# Patient Record
Sex: Female | Born: 1950 | Race: White
Health system: Southern US, Community
[De-identification: ages and names within clinical notes are randomized; demographics above are authoritative.]

## PROBLEM LIST (undated history)

## (undated) DIAGNOSIS — J449 Chronic obstructive pulmonary disease, unspecified: Secondary | ICD-10-CM

## (undated) DIAGNOSIS — C3490 Malignant neoplasm of unspecified part of unspecified bronchus or lung: Secondary | ICD-10-CM

## (undated) DIAGNOSIS — C50919 Malignant neoplasm of unspecified site of unspecified female breast: Secondary | ICD-10-CM

## (undated) DIAGNOSIS — I10 Essential (primary) hypertension: Secondary | ICD-10-CM

## (undated) DIAGNOSIS — E119 Type 2 diabetes mellitus without complications: Secondary | ICD-10-CM

## (undated) HISTORY — DX: Malignant neoplasm of unspecified part of unspecified bronchus or lung: C34.90

## (undated) HISTORY — DX: Malignant neoplasm of unspecified site of unspecified female breast: C50.919

## (undated) HISTORY — DX: Essential (primary) hypertension: I10

## (undated) HISTORY — DX: Type 2 diabetes mellitus without complications: E11.9

## (undated) HISTORY — DX: Chronic obstructive pulmonary disease, unspecified: J44.9

---

## 2013-04-01 IMAGING — MG MAMMO SCRN BIL W/CAD
6 series · 6 of 6 positions shown · non-contrast
Comparison: none

Images Obtained from [facility]
CLINICAL RA REF: Mammo Scrn Bilateral (Digital) W/ Cad Routine.
Comparison is made to exams dated:  02/02/2008 and 05/01/2006 [HOSPITAL].
The tissue of both breasts is predominantly fatty.
Current study was also evaluated with a Computer Aided Detection (CAD) system.
There is a new oval mass in the left breast at 5 o'clock anterior depth.
No other significant abnormalities are seen in either breast.

[R CC]
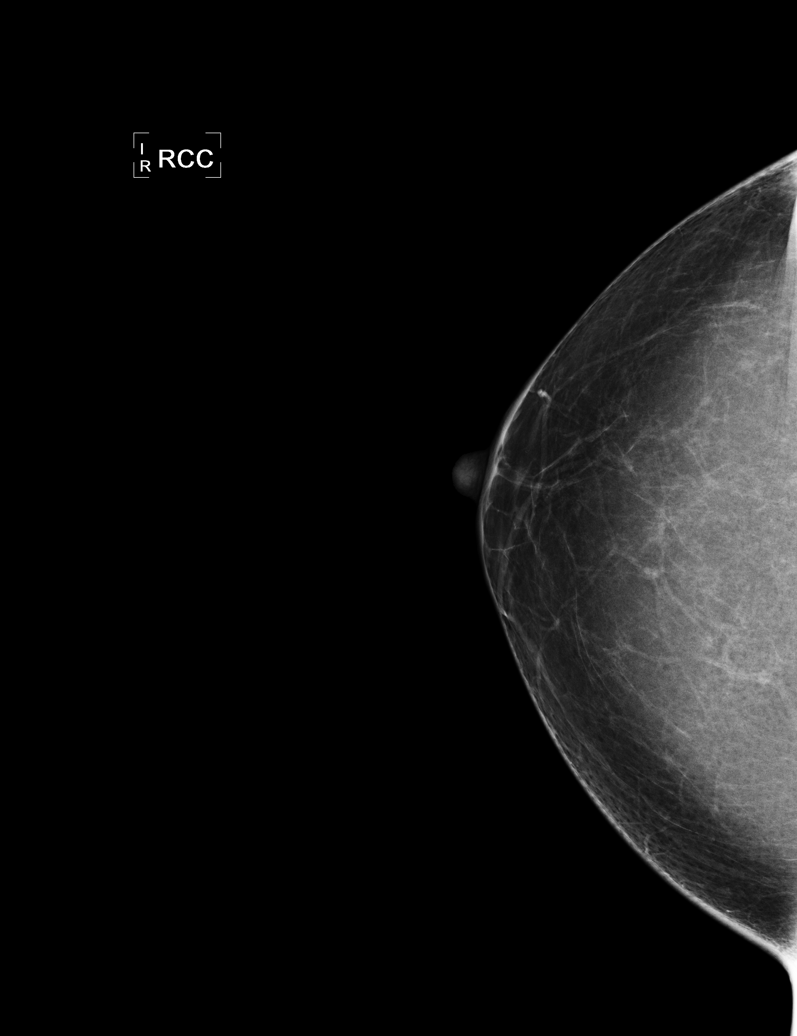

[L CC (1 of 2)]
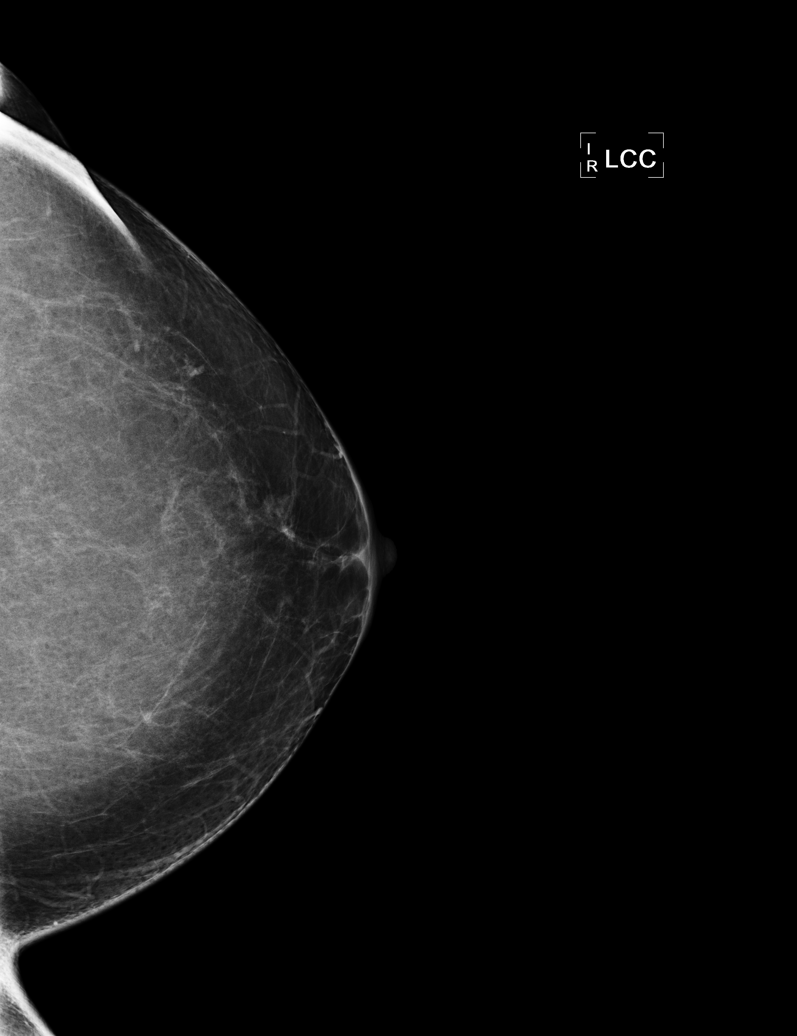

[L MLO (1 of 2)]
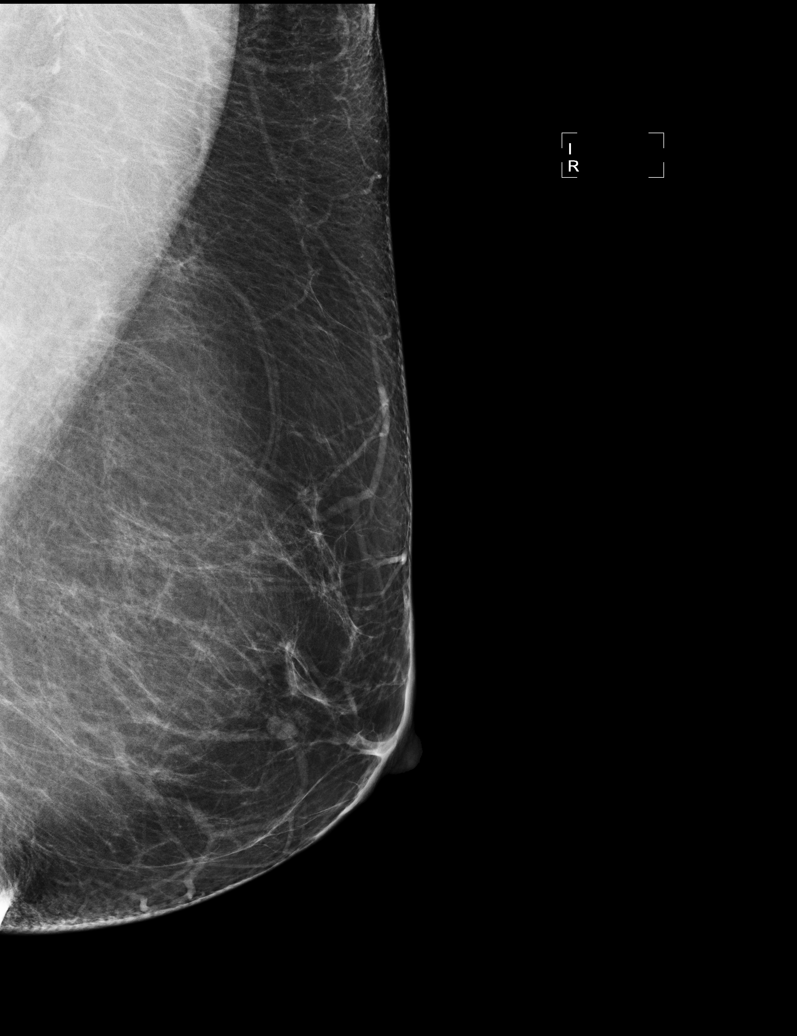

[R MLO]
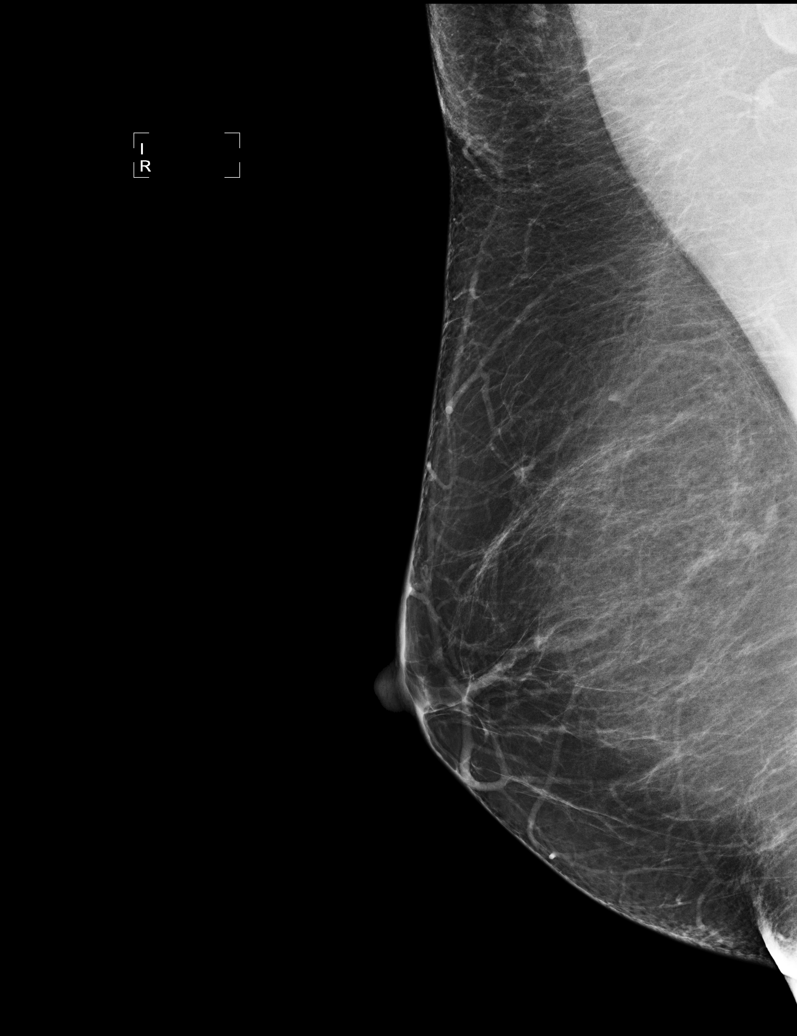

[L MLO (2 of 2)]
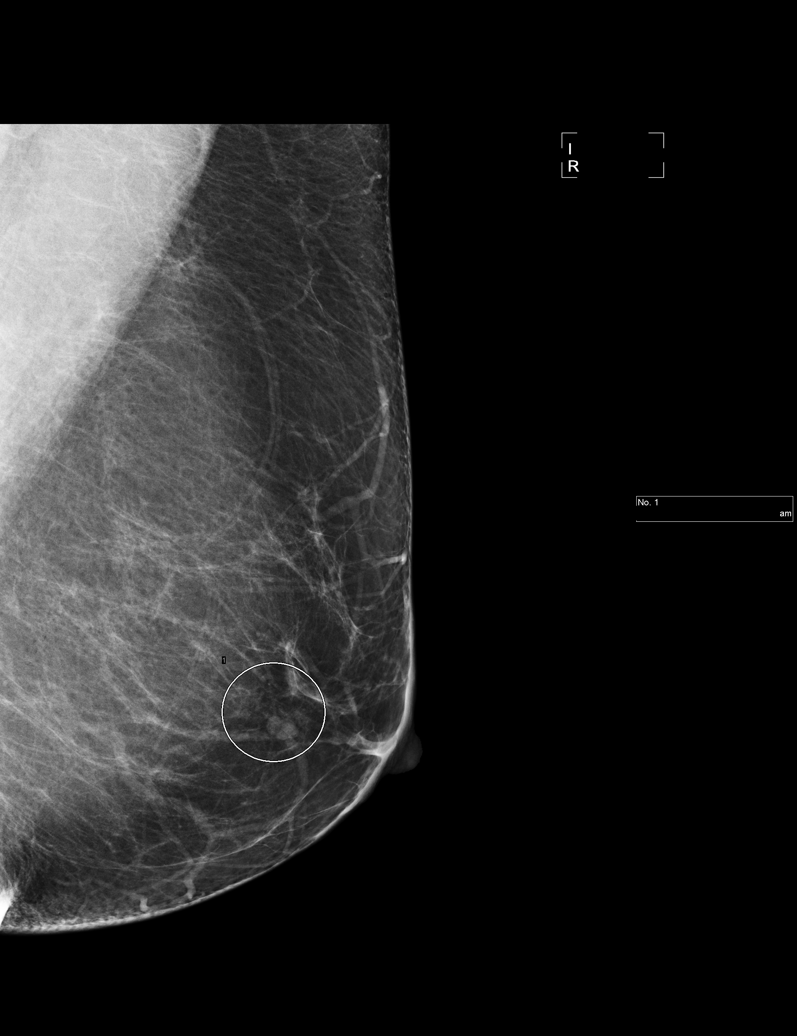

[L CC (2 of 2)]
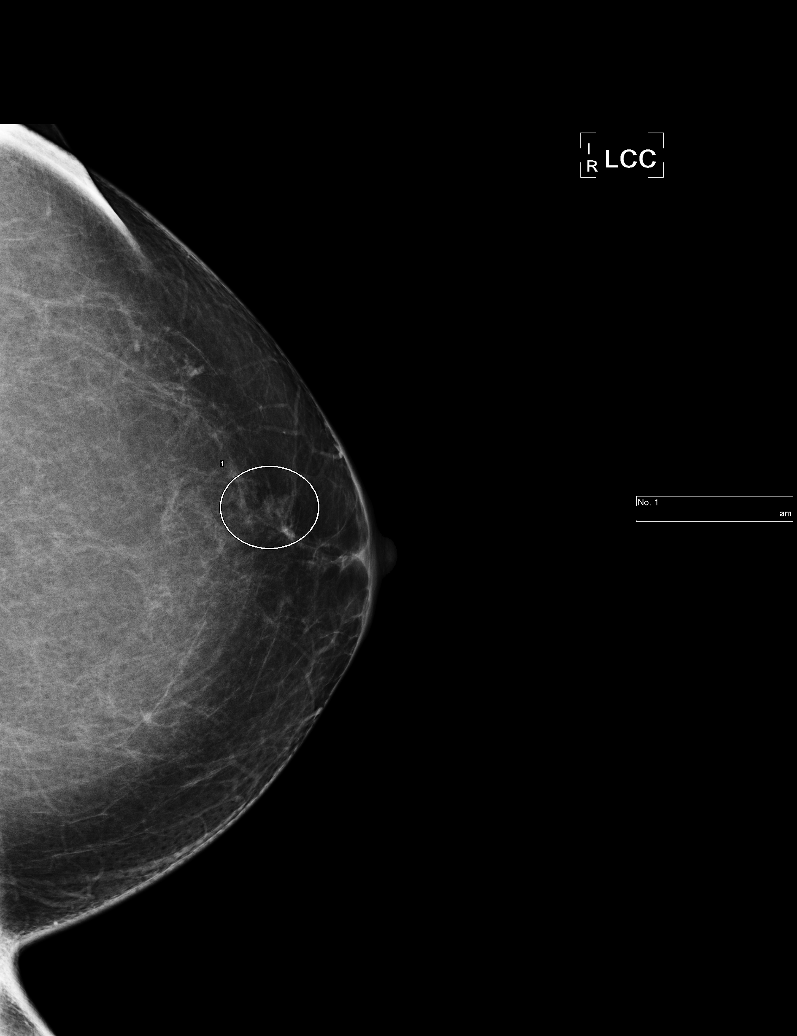

[6 of 6 positions shown; findings below may reference images not displayed]

IMPRESSION: The new oval mass in the left breast is indeterminate.  Additional views with possible ultrasound are recommended.
mwm/penrad:04/05/2013 [DATE]
letter sent: Birad 0
Mammogram BI-RADS: 0 INCOMPLETE:NEED ADDITIONAL IMAGING EVALUATION   [id number] v82.60

## 2013-04-01 IMAGING — OT DXA BONE DENSITY
2 series · 2 of 2 positions shown · non-contrast
Comparison: none

REASON FOR EXAM: Postmenopausal evaluate bone density

Risk Factors:  Current smoker
Prior Exams:  Done at [HOSPITAL] February 02, 2008 diagnosed as normal bone mineral density
Method:  Scans of the spine between L1-L4 and hip were performed using dual energy X-ray densitometry (DXA) with the [employer] Discovery-SL system, serial number [id number].

[Series 1: — · 1 of 1 slices shown (1 of 2)]
[im 1/1]
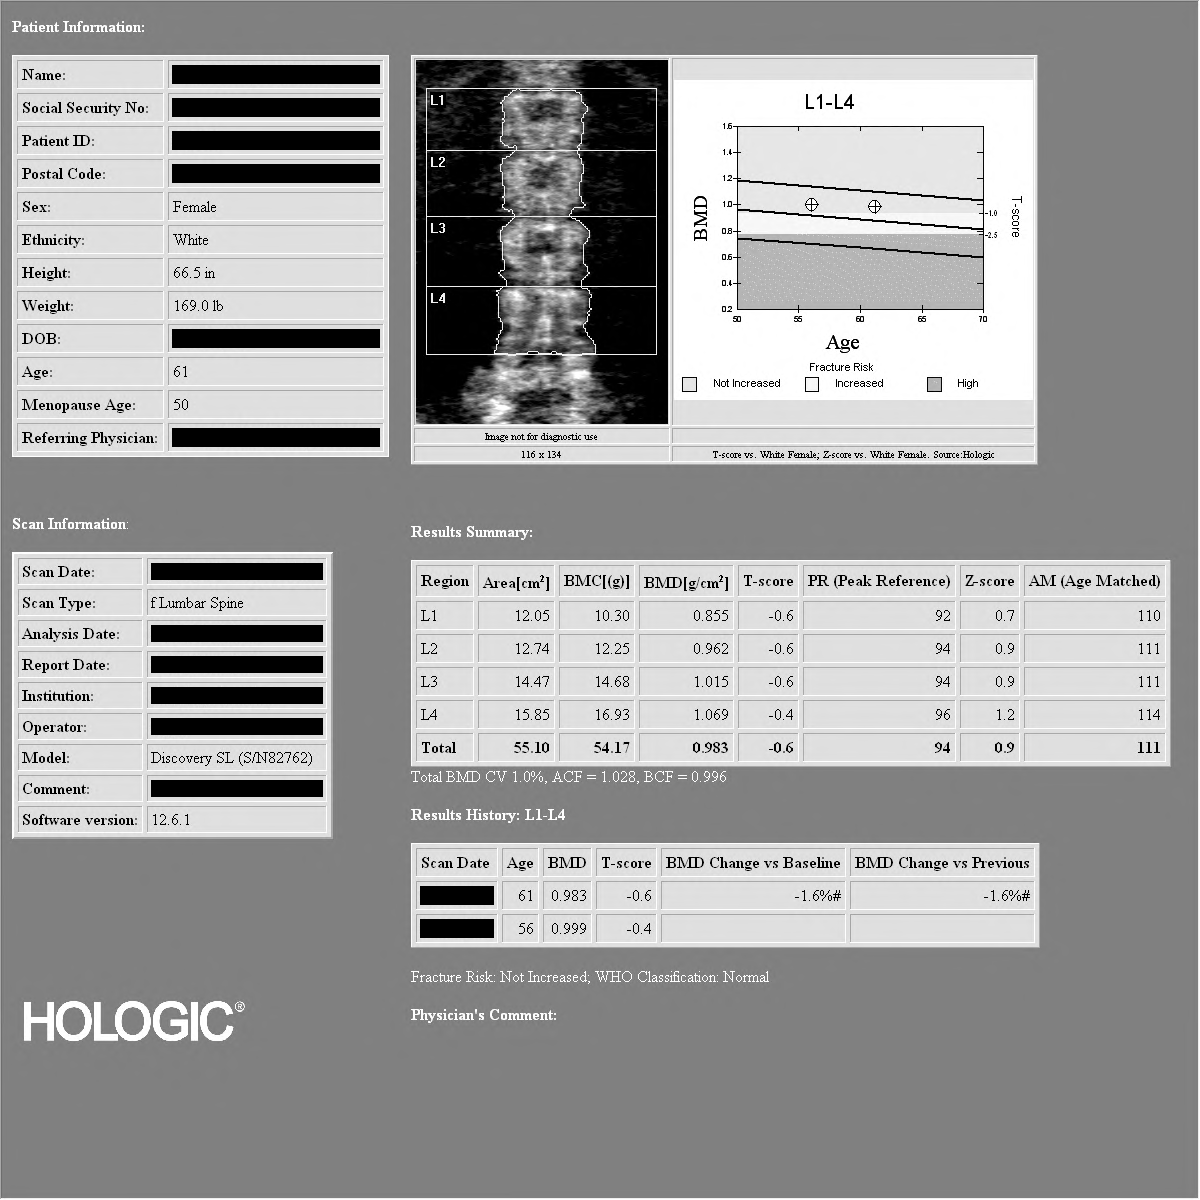

[Series 2: — · left · 1 of 1 slices shown (2 of 2)]
[im 1/1]
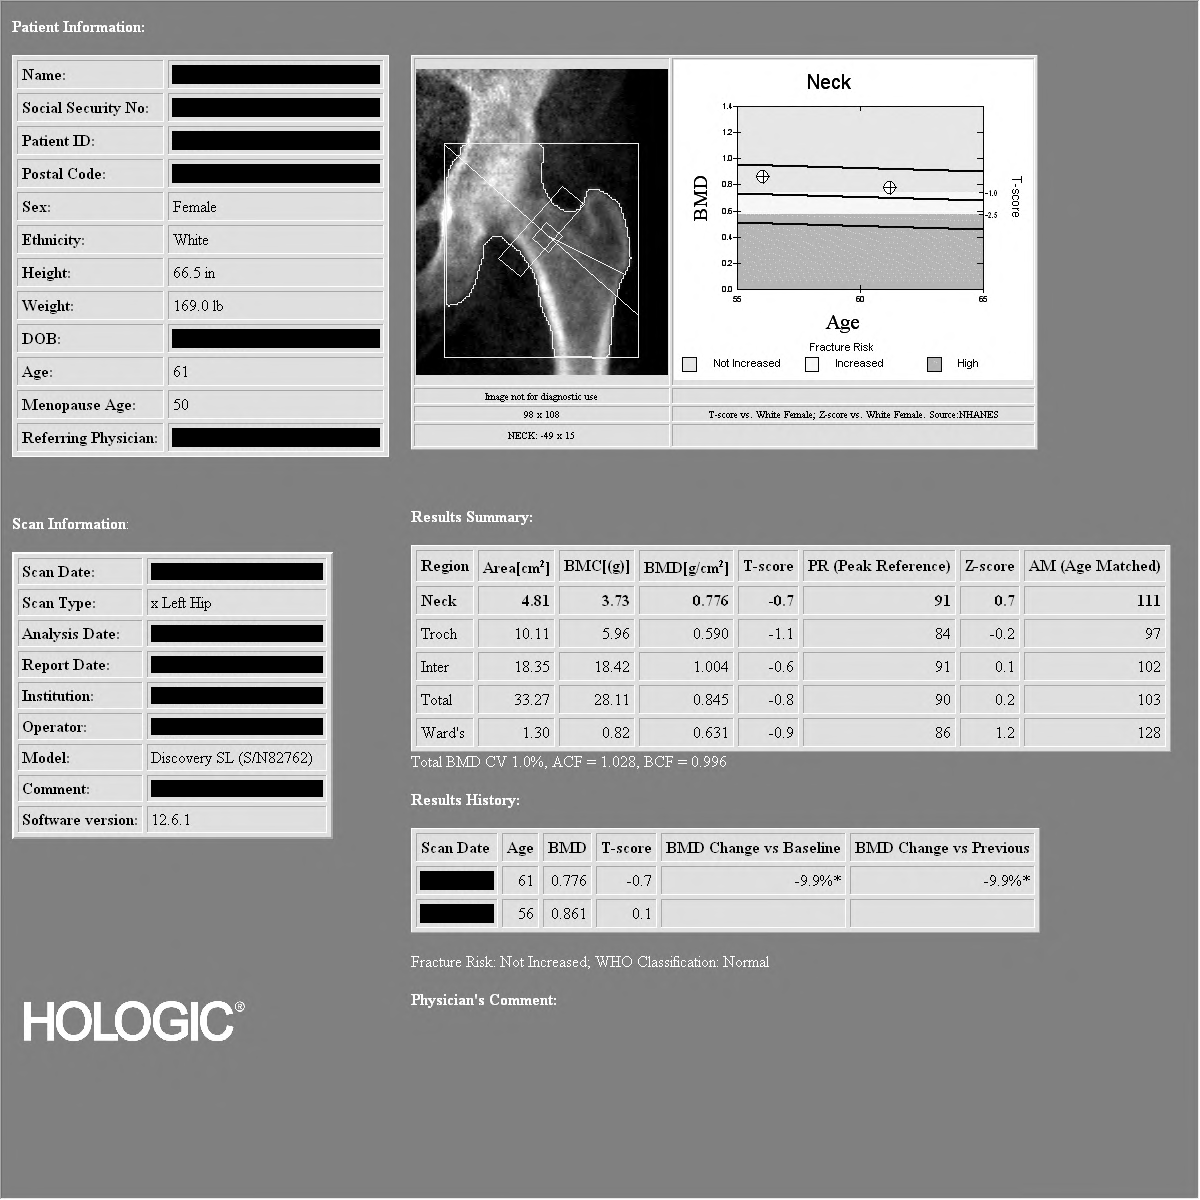

[2 of 2 positions shown; findings below may reference images not displayed]

IMPRESSION: As defined by World Health Organization, the patient meets the criteria for NORMAL BONE DENSITY based on both spine and hip T-score.  

Patient Demographics:  62 year old Caucasian Female.
FINDINGS: 1.    Review of scanogram images shows no factor invalidating scan results.  

2.    Lumbar spine exam shows average Bone Mineral Density is 0.983 gm/cm2 of Hydroxyapatite.  The T-score (comparing patient with a young adult group) is 0.6 standard deviations BELOW mean. The Z-score (comparing patient with an age-matched group) is 0.9 standard deviations ABOVE mean.

COMPARED TO PRIOR DXA, spine bone density was 0.999 gm/cm2.  This is an interval decrease of 0.016 gm/cm2 or 1.6 %. This is not a statistically significant interval decrease.

3.   Left hip exam using femoral neck region of interest shows average Bone Mineral Density is 0.776 gm/cm2 of Hydroxyapatite. The T-score (comparing patient with a young adult group) is 0.7 standard deviations BELOW mean. The Z-score (comparing patient with an age-matched group) is 0.7 standard deviations ABOVE mean.

COMPARED TO PRIOR DXA, femoral neck bone density was 0.861 gm/cm2.  This is an interval decrease of 0.085 gm/cm2 or 9.9 %. Technologist least significant change in the hip is 0.017 gm/cm2. This is a statistically significant interval decrease.

Recommendations:  In addition to assuring the patient is receiving adequate calcium and vitamin D, the patient states that she is not taking supplements on a regular basis, cessation of smoking and regular exercise to patient tolerance would be of benefit.  The patient is currently not taking prescribed medication for prevention of bone loss  According to new criteria established by The National Osteoporosis Foundation in 9449, the patient DOES NOT meet the current indications for prescribed medical therapy.  The National Osteoporosis Foundation now recommends followup DXA scanning every two years in patients at risk regardless of whether the patient is undergoing pharmacological treatment.

World Health Organization criteria for diagnosis, please see link below.  

[URL]

## 2013-05-05 IMAGING — US US BREAST LT
1 series · 6 of 6 positions shown · non-contrast
Comparison: none

Images Obtained from [facility]
CLINICAL RA REF: Patient returns for additional imaging over a suspected mass in the left breast.
Comparison is made to exams dated:  04/01/2013 and 02/02/2008 [HOSPITAL].
The tissue of the left breast is predominantly fatty.
There is an oval mass with a circumscribed margin in the left breast at 5 o'clock anterior depth.  This is seen in additional views.  Targeted ultrasound demonstrates a 0.7 cm x 0.4 cm cluster of
cysts at 5 o'clock anterior depth 2 cm from the nipple.  This correlates with mammography findings.
No other significant masses or calcifications are seen in the breast on the mammogram or targeted ultrasound.

[Series 1: us breast left · 6 of 6 slices shown]
[im 1/6]
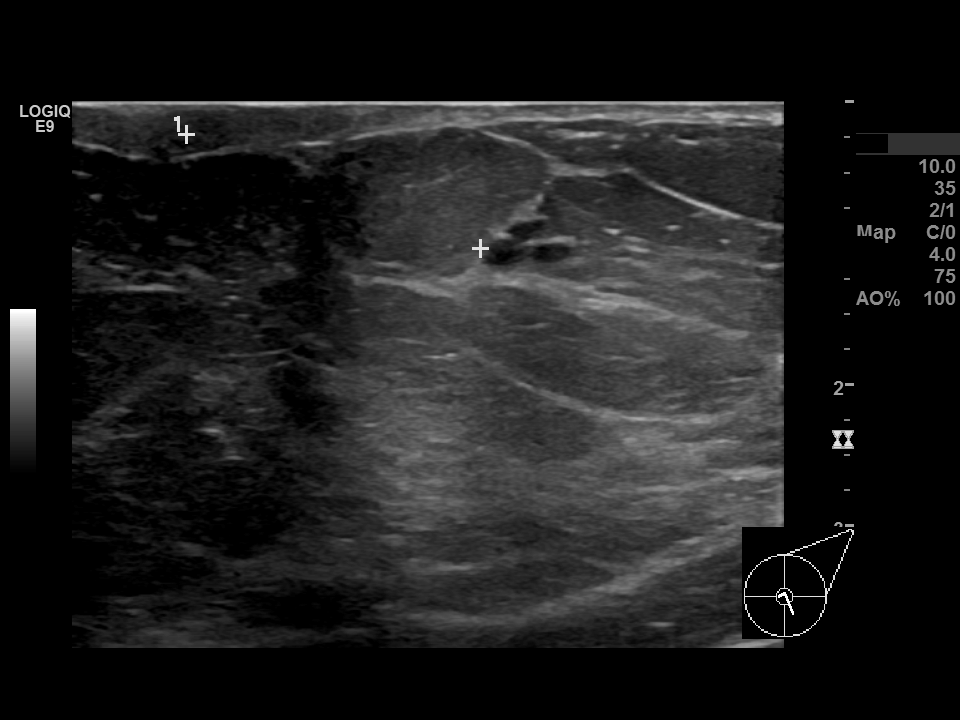
[im 2/6]
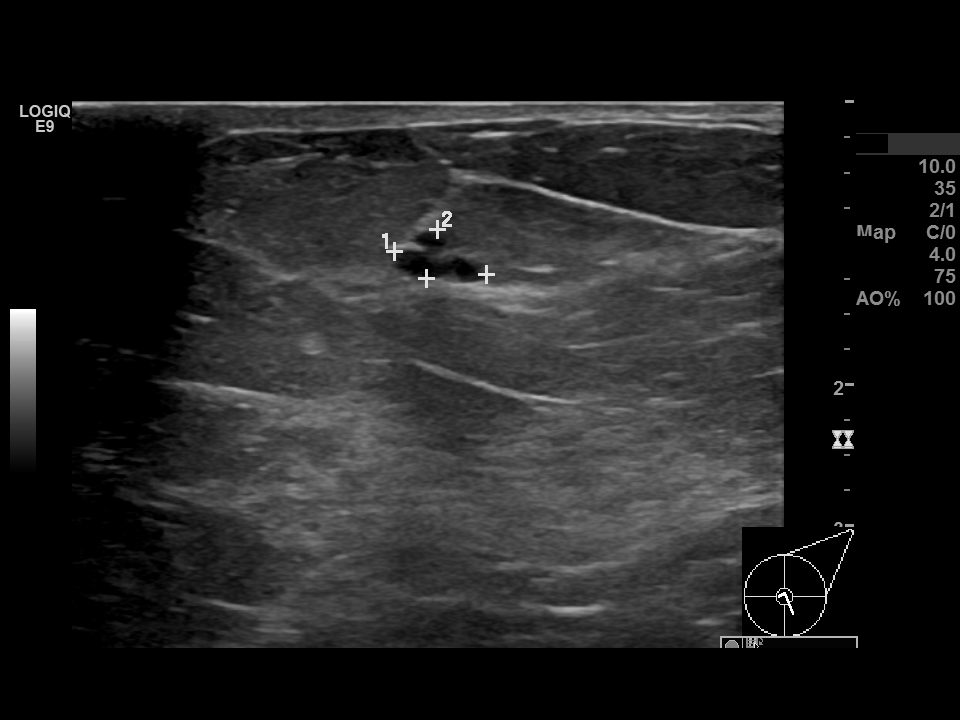
[im 3/6]
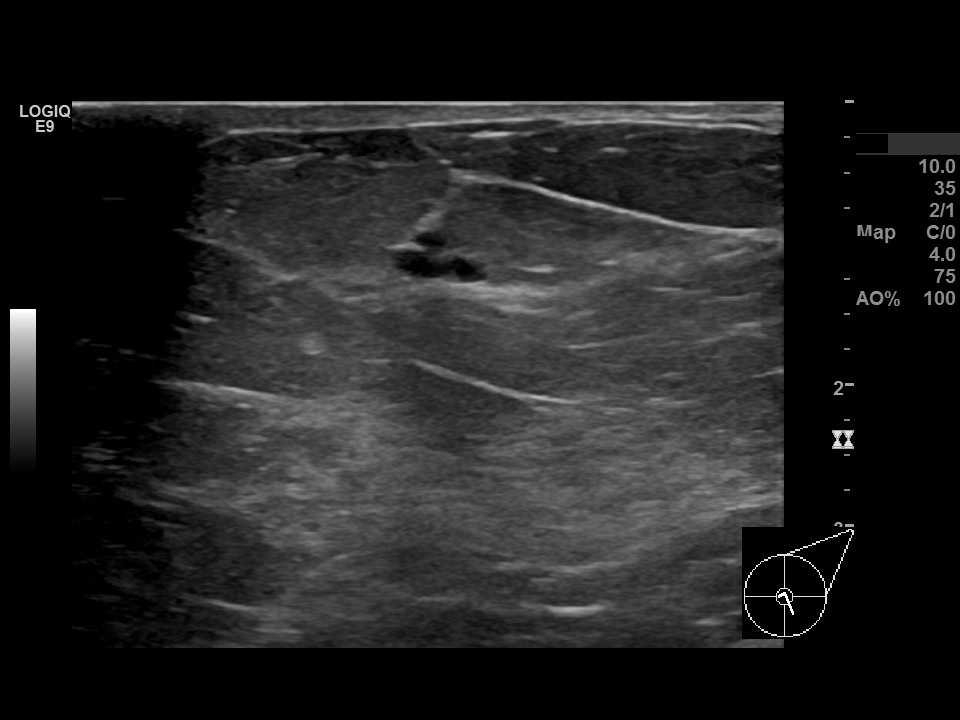
[im 4/6]
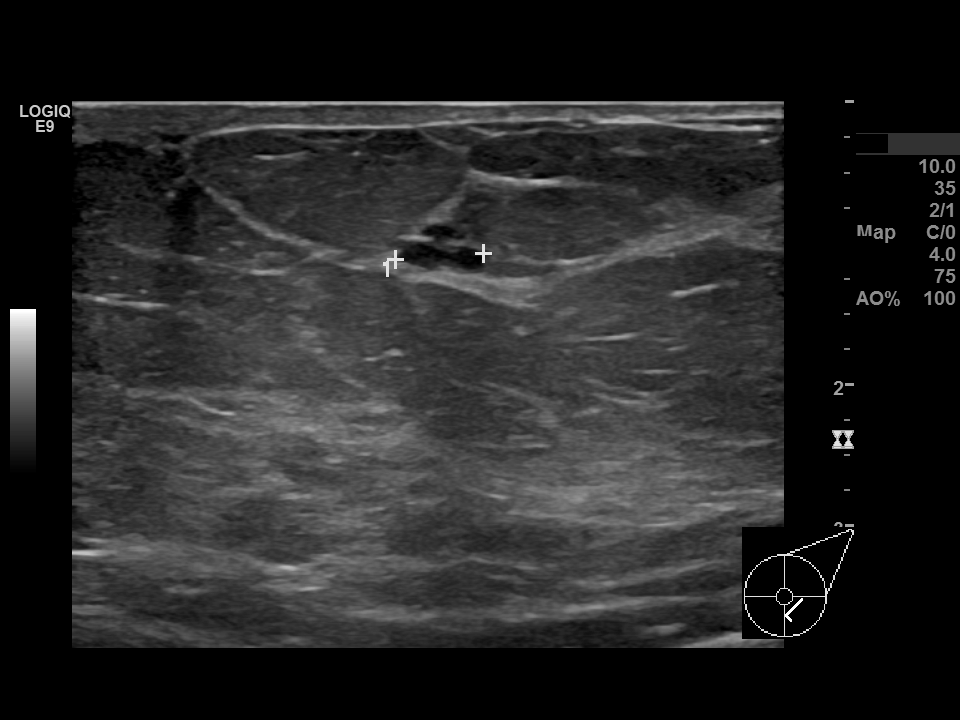
[im 5/6]
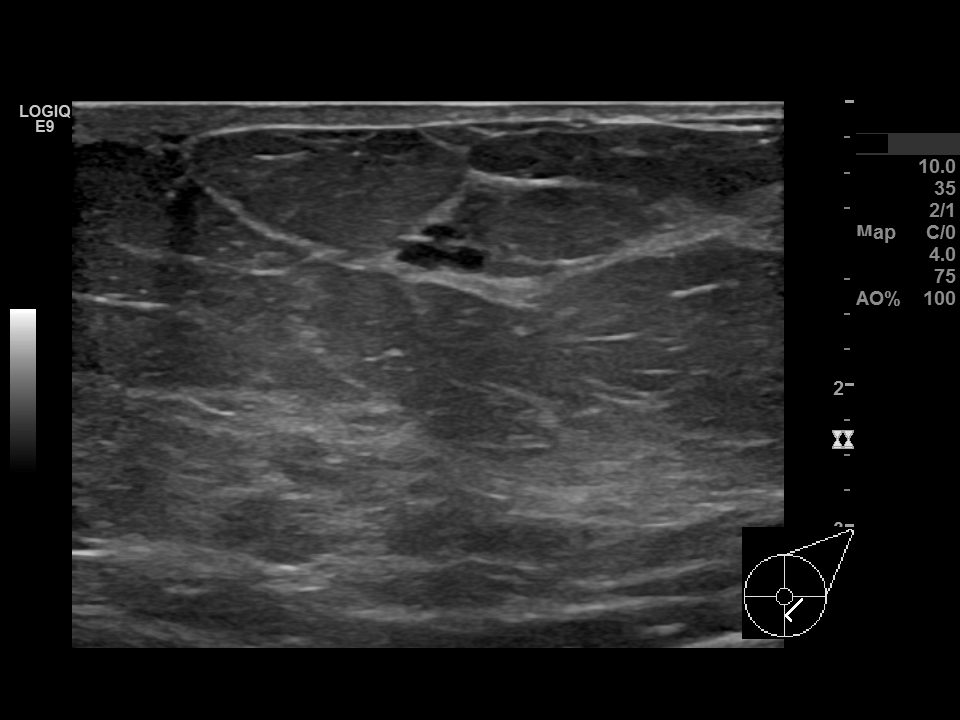
[im 6/6]
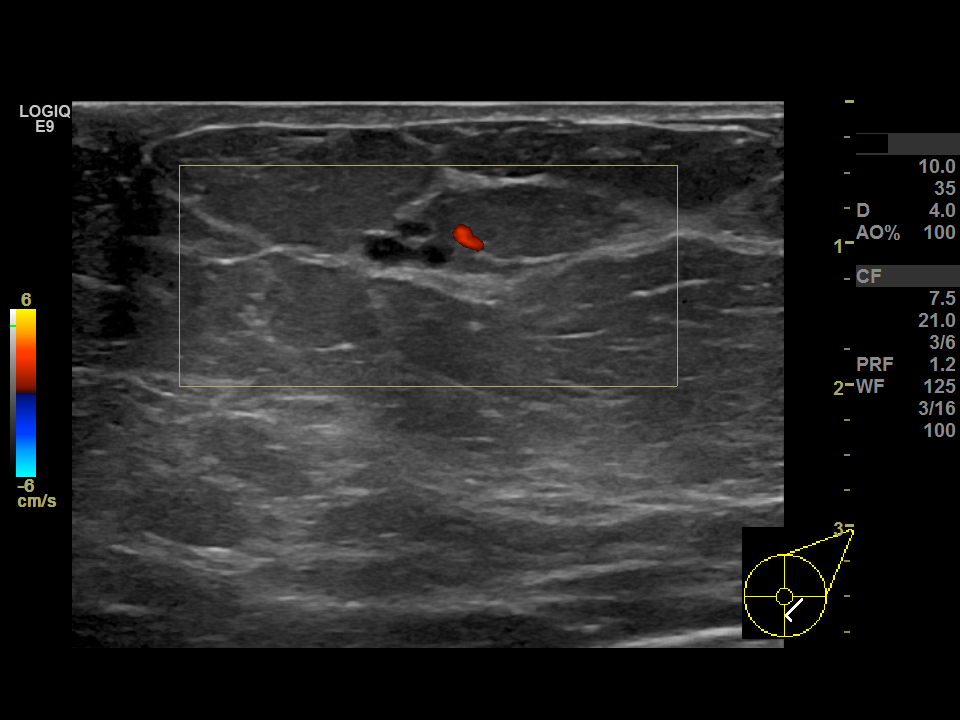

[6 of 6 positions shown; findings below may reference images not displayed]

IMPRESSION: The 0.7 cm x 0.4 cm cluster of cysts in the left breast is benign.
There is no mammographic or targeted sonographic evidence of malignancy.  A 1 year screening mammogram is recommended.
SUMMARY: Findings and recommendations were discussed with the patient.
mwm/penrad:05/05/2013 [DATE]
letter sent: Normal
Mammogram BI-RADS: 2 Benign  Ultrasound BI-RADS: 2 Benign   [id number]

## 2013-05-05 IMAGING — MG MAMMO DIAG LT
2 series · 2 of 2 positions shown · non-contrast
Comparison: none

Images Obtained from [facility]
CLINICAL RA REF: Patient returns for additional imaging over a suspected mass in the left breast.
Comparison is made to exams dated:  04/01/2013 and 02/02/2008 [HOSPITAL].
The tissue of the left breast is predominantly fatty.
There is an oval mass with a circumscribed margin in the left breast at 5 o'clock anterior depth.  This is seen in additional views.  Targeted ultrasound demonstrates a 0.7 cm x 0.4 cm cluster of
cysts at 5 o'clock anterior depth 2 cm from the nipple.  This correlates with mammography findings.
No other significant masses or calcifications are seen in the breast on the mammogram or targeted ultrasound.

[L CC]
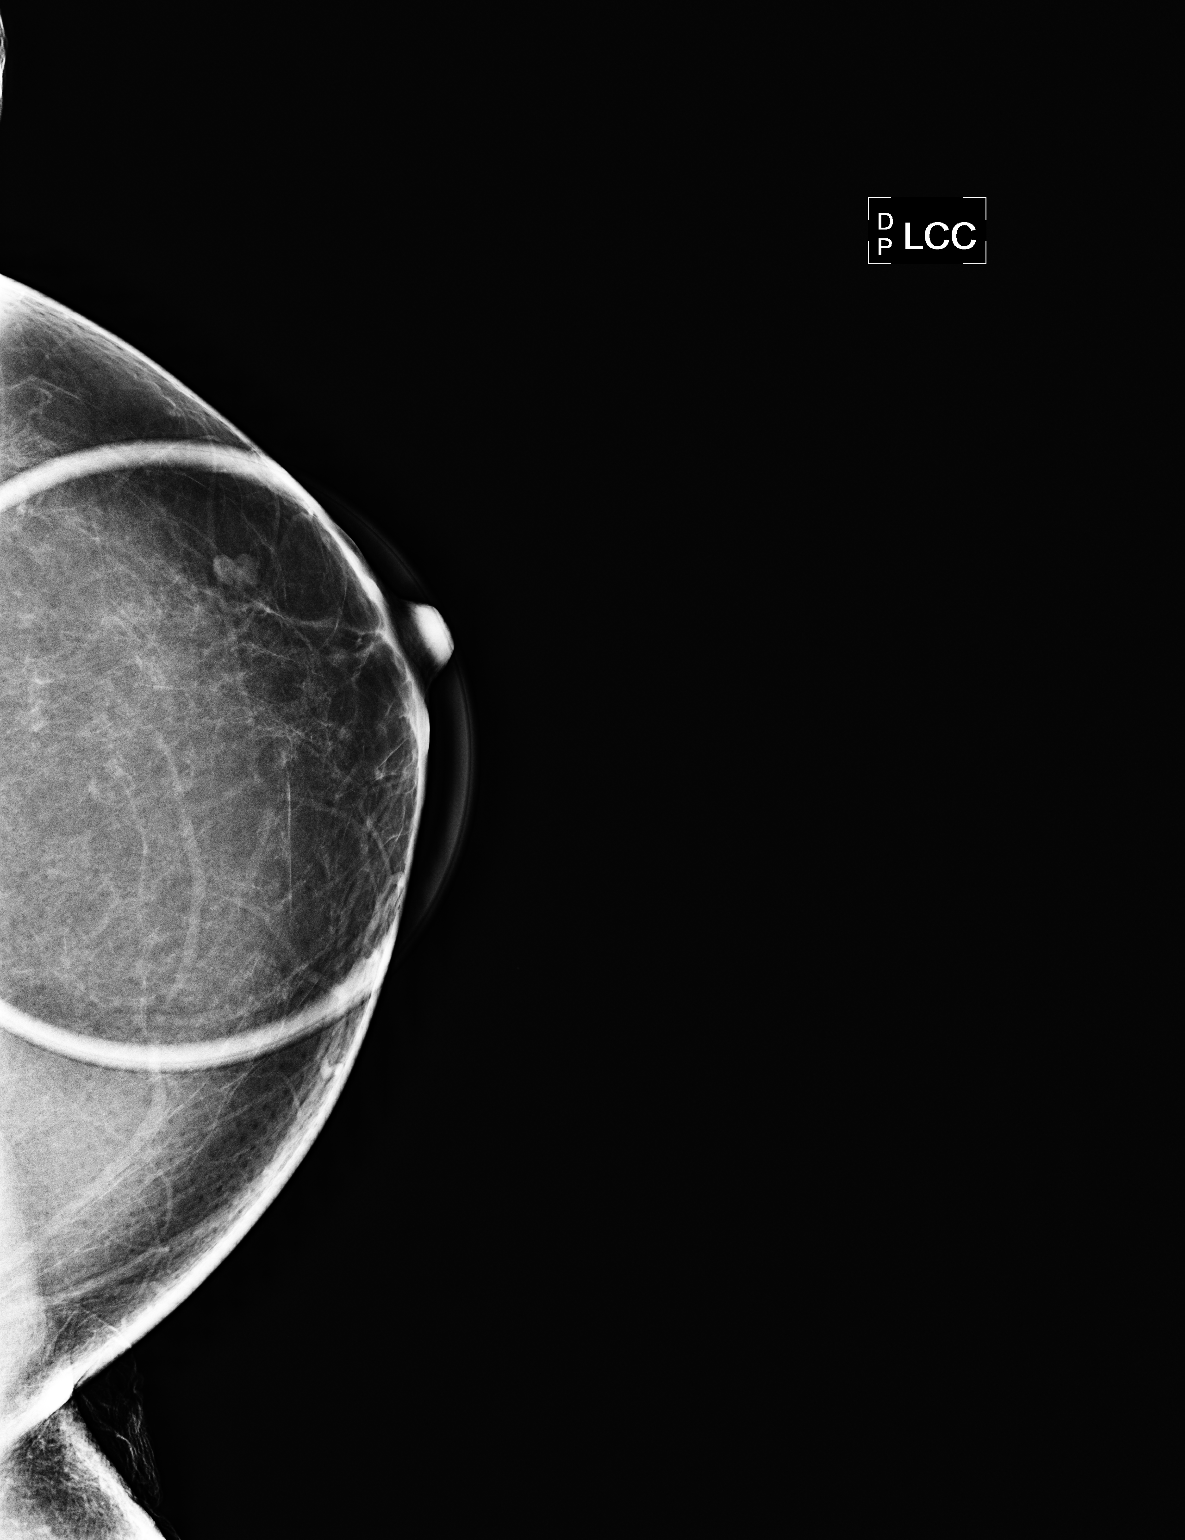

[L MLO]
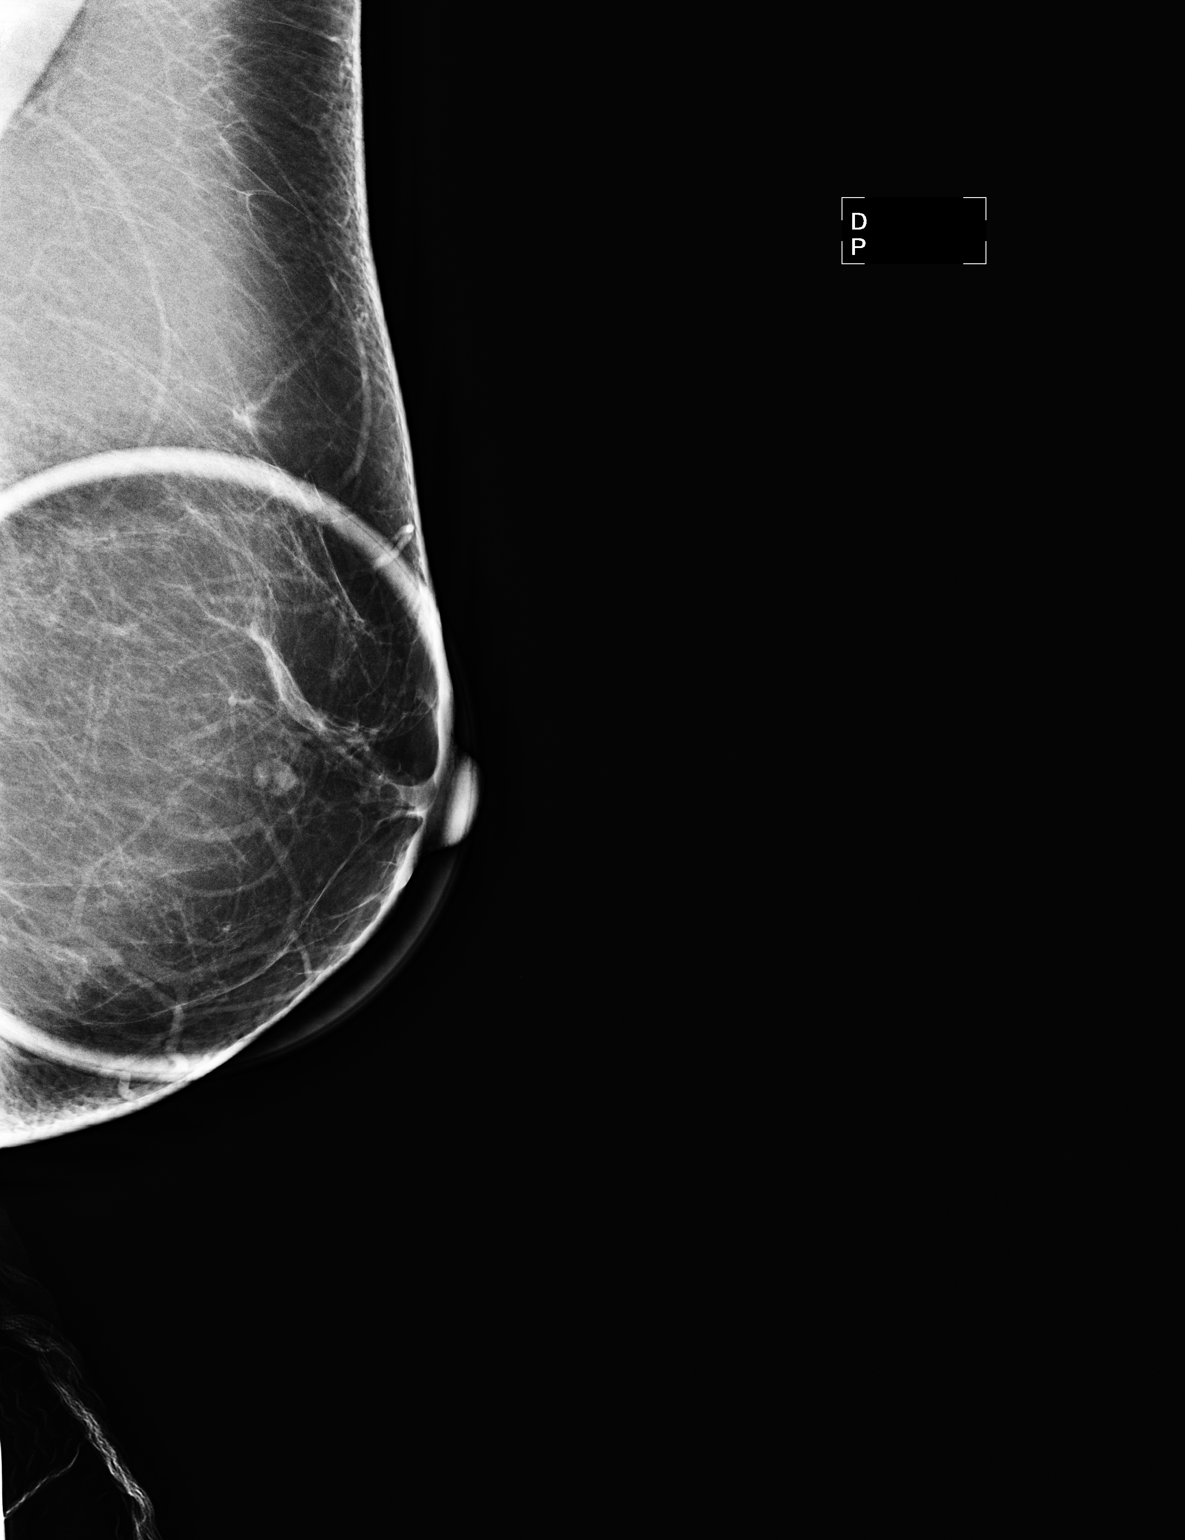

[2 of 2 positions shown; findings below may reference images not displayed]

IMPRESSION: The 0.7 cm x 0.4 cm cluster of cysts in the left breast is benign.
There is no mammographic or targeted sonographic evidence of malignancy.  A 1 year screening mammogram is recommended.
SUMMARY: Findings and recommendations were discussed with the patient.
mwm/penrad:05/05/2013 [DATE]
letter sent: Normal
Mammogram BI-RADS: 2 Benign  Ultrasound BI-RADS: 2 Benign   [id number]

## 2013-10-01 IMAGING — CR CHEST 2 VWS PA LAT
1 series · 2 of 2 positions shown · non-contrast
Comparison: 03/06/2009.

HISTORY: Cough and congestion.
TECHNIQUE: Chest x-ray, two views.

[Series 1: view not recorded · 0.17mm/px · 2 of 2 slices shown]
[im 1/2]
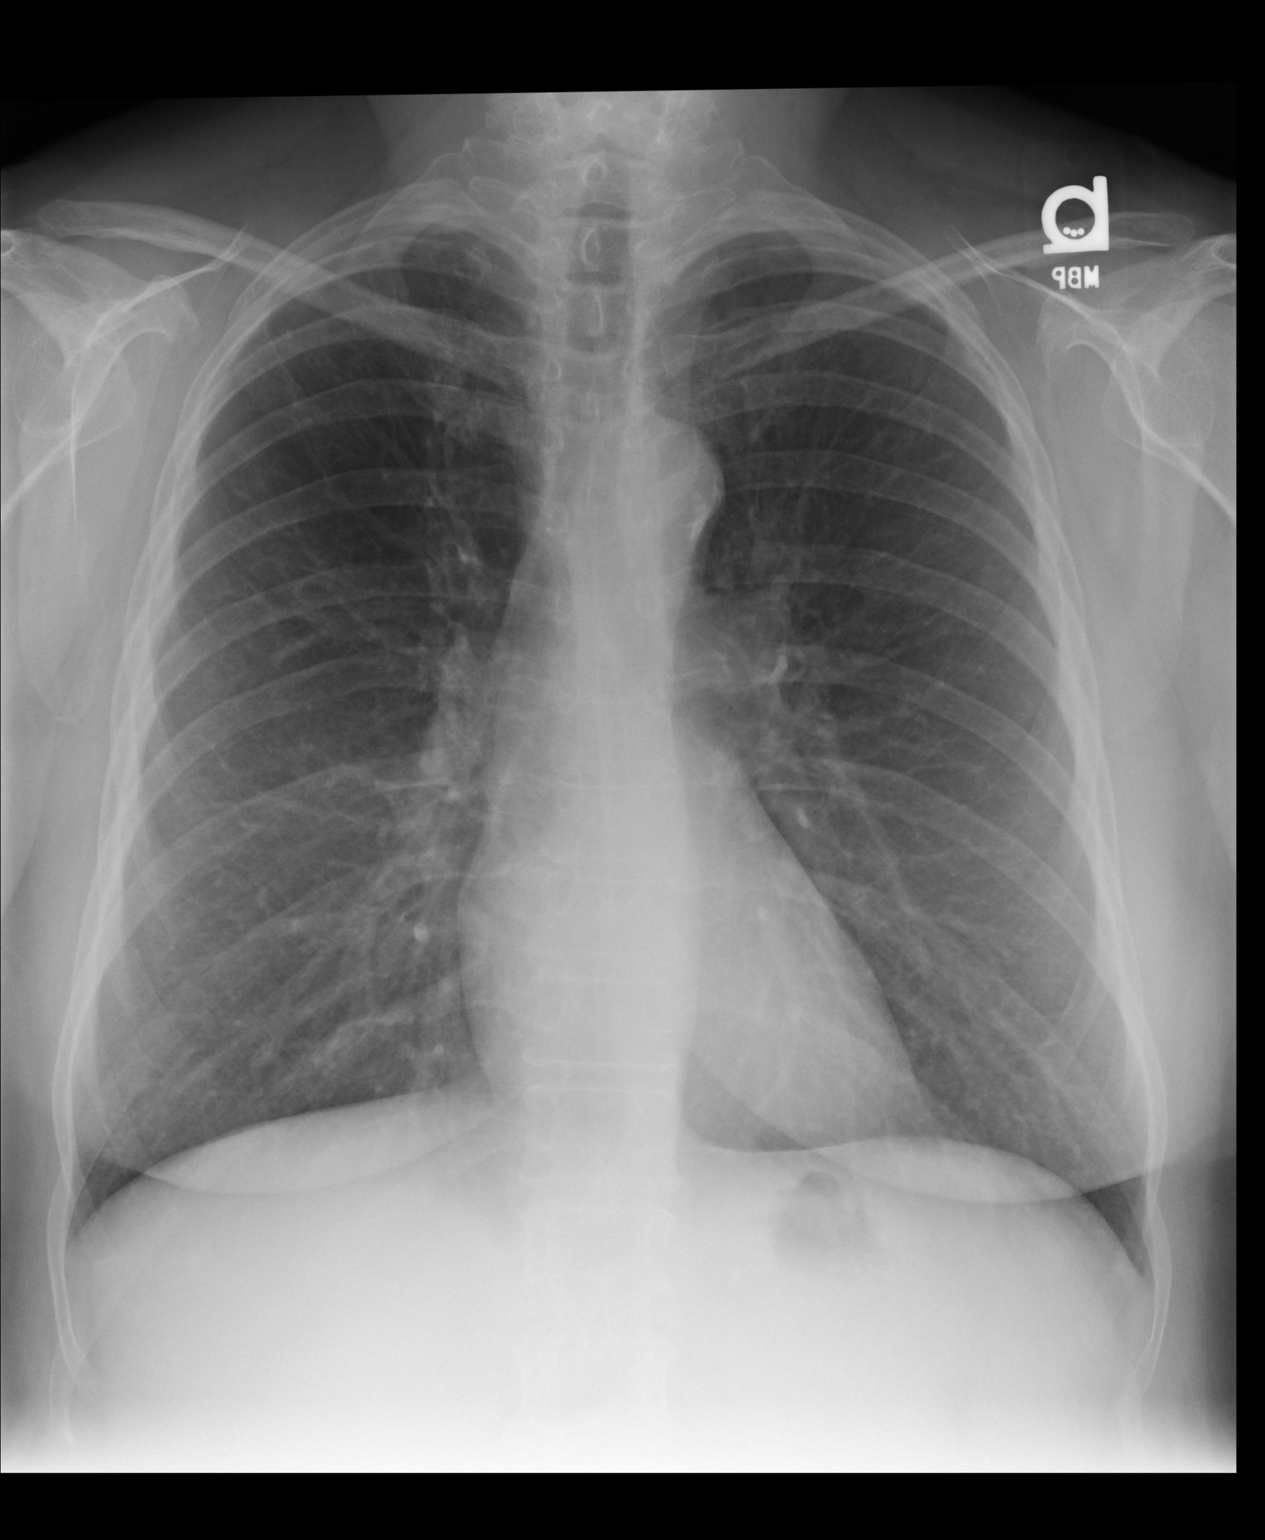
[im 2/2]
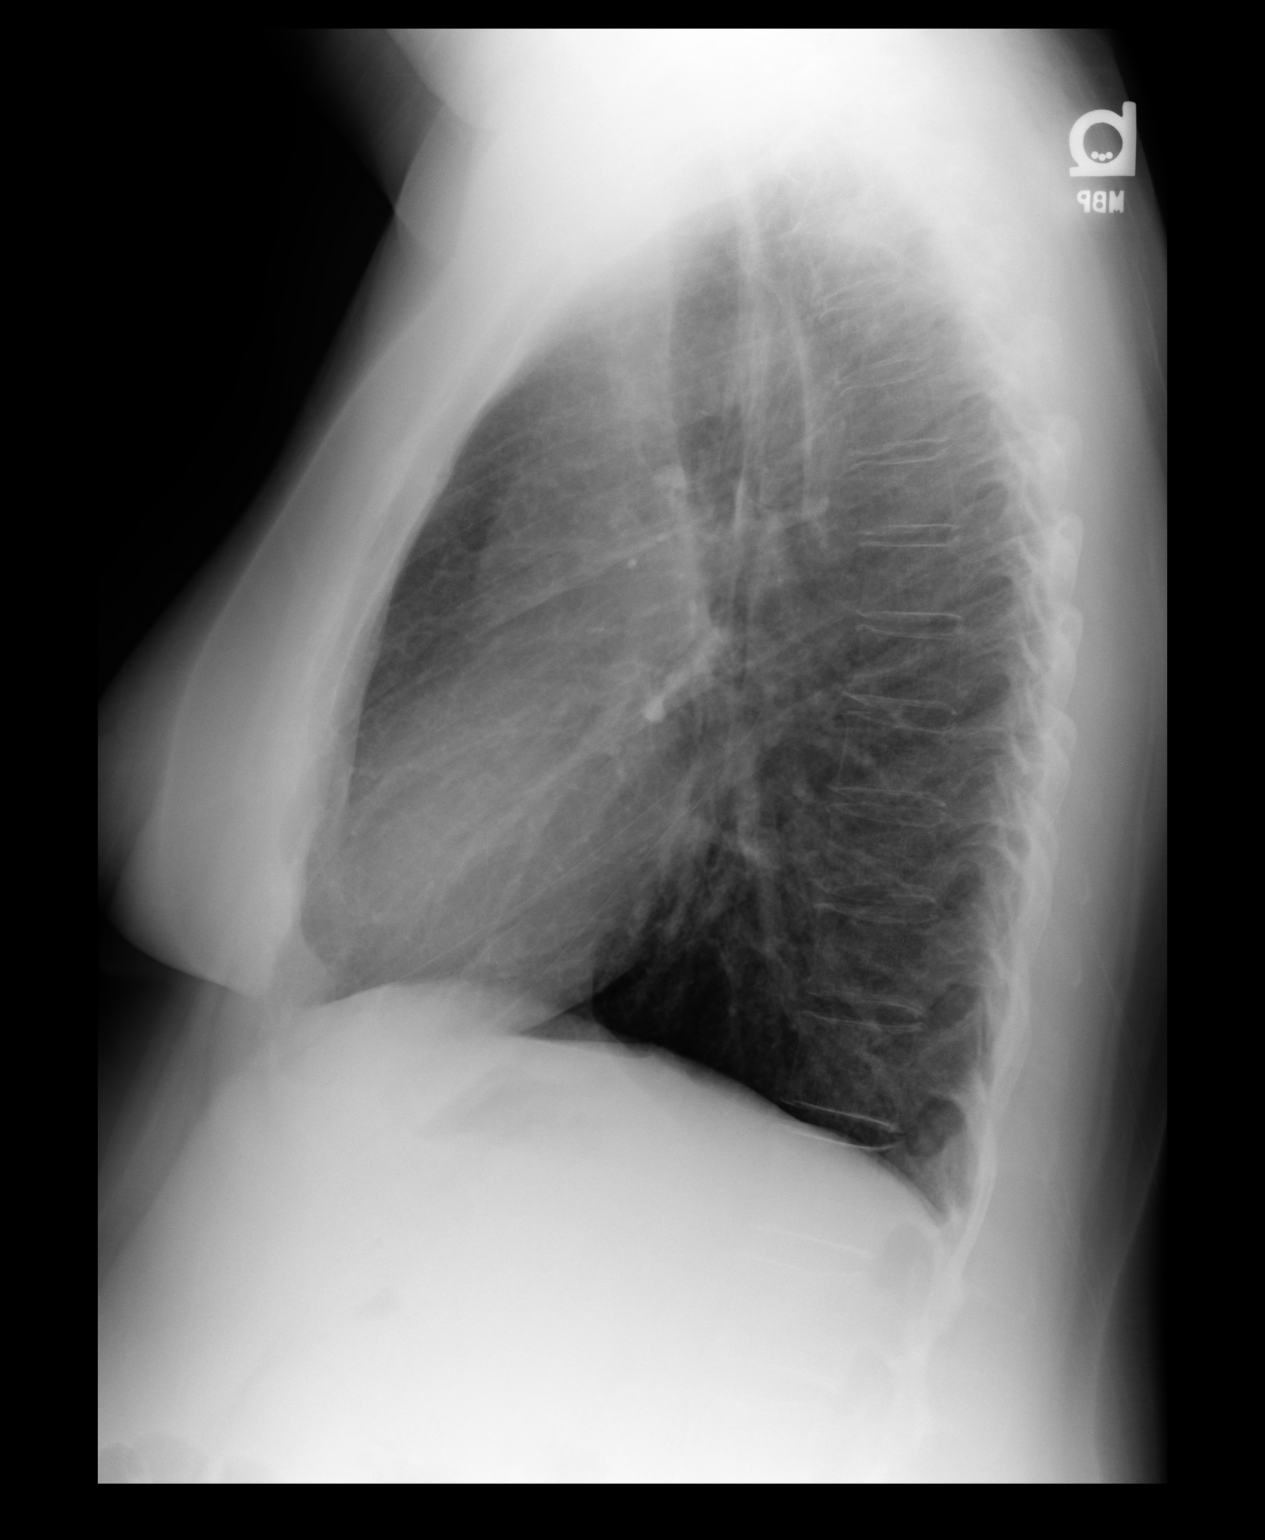

[2 of 2 positions shown; findings below may reference images not displayed]

FINDINGS: There is mild air trapping. No infiltrate or pleural fluid or pulmonary nodules are seen. The heart size is normal.  The aorta is mildly atherosclerotic but not tortuous.
IMPRESSION: Stable chest x-rays. Air trapping is again seen but nothing acute seen.

## 2015-12-28 IMAGING — MG MAMMO SCRN BIL W/CAD TOMO
8 series · 9 of 24 positions shown · non-contrast
Comparison: none

Images Obtained from [facility]
CLINICAL RA REF: Mammo Scrn bilateral (Digital) W/Cad Routine with Tomosynthesis.
Digital images were generated from the 3D Tomosynthesis data acquired during the exam.
Comparison is made to exams dated:  04/01/2013, 02/02/2008, and 05/01/2006 [HOSPITAL] - [HOSPITAL].
The tissue of both breasts is predominantly fatty.
Current study was also evaluated with a Computer Aided Detection (CAD) system.
There is a benign cyst in the left breast.
No significant abnormalities are seen in either breast.
There has been no significant interval change.

[R MLO]
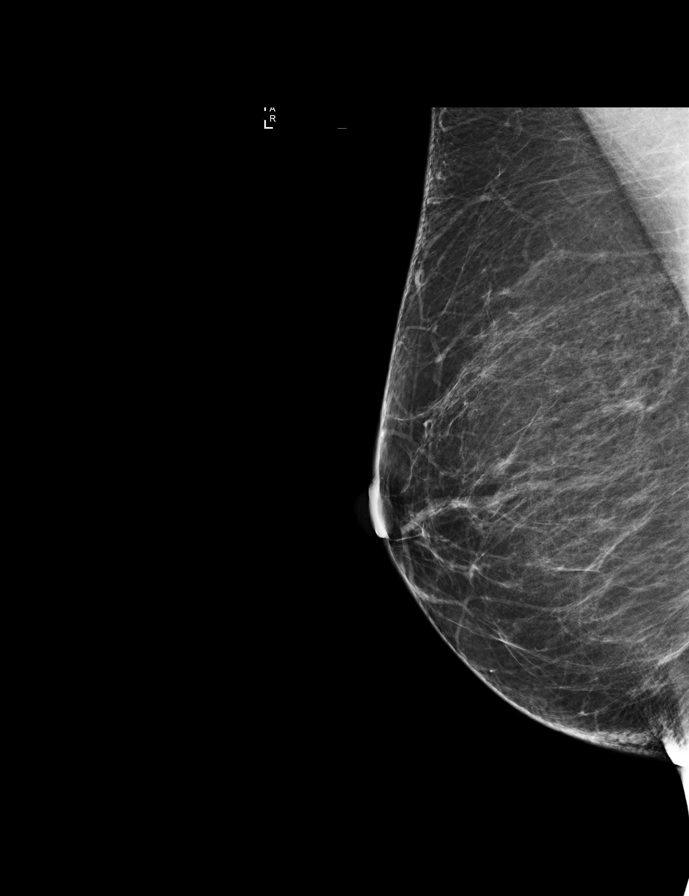

[L MLO]
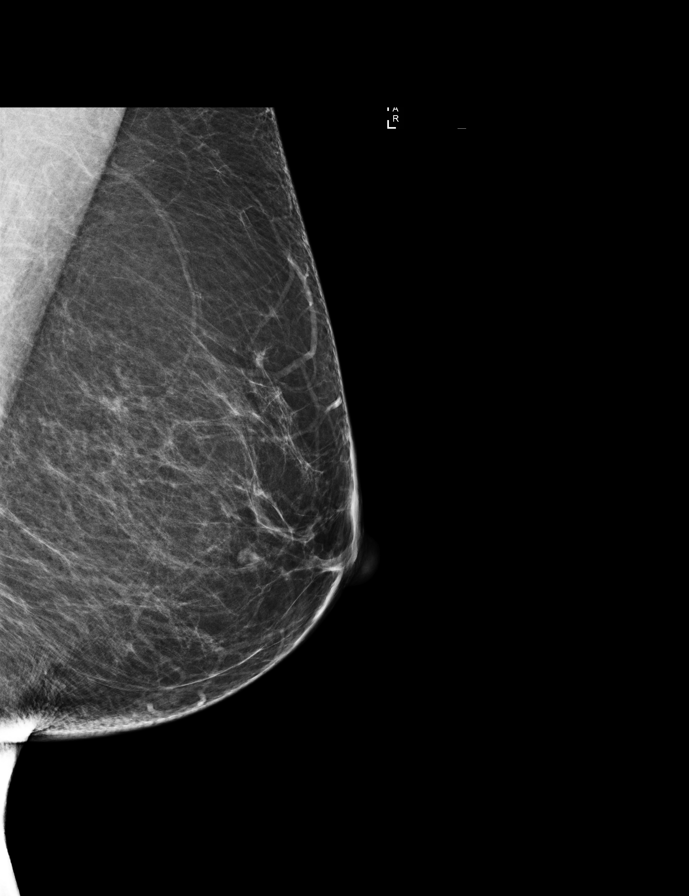

[L CC]
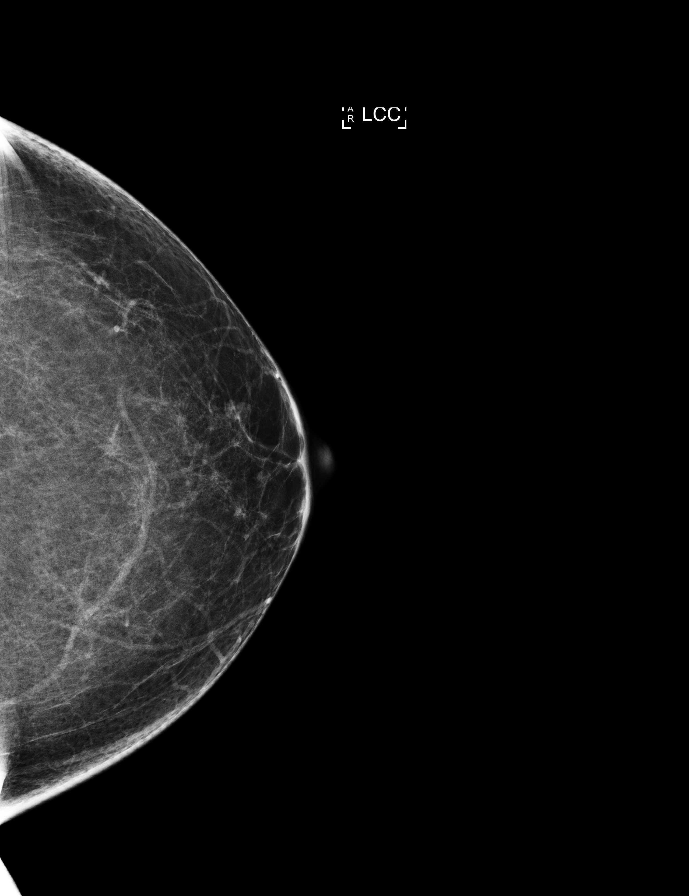

[R CC]
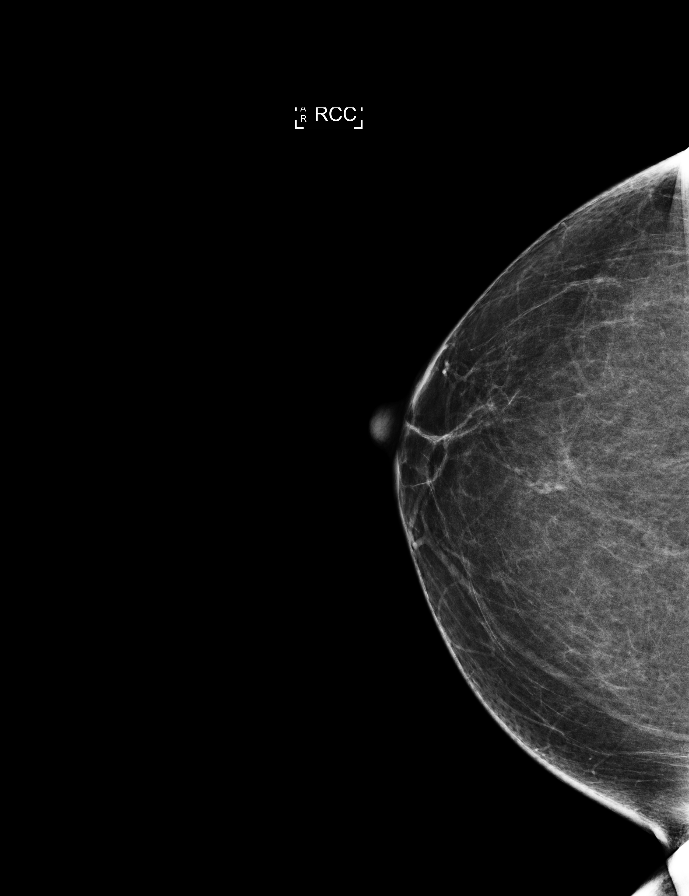

[R MLO tomo · 2 of 79 frames shown]
[frame 26/79]
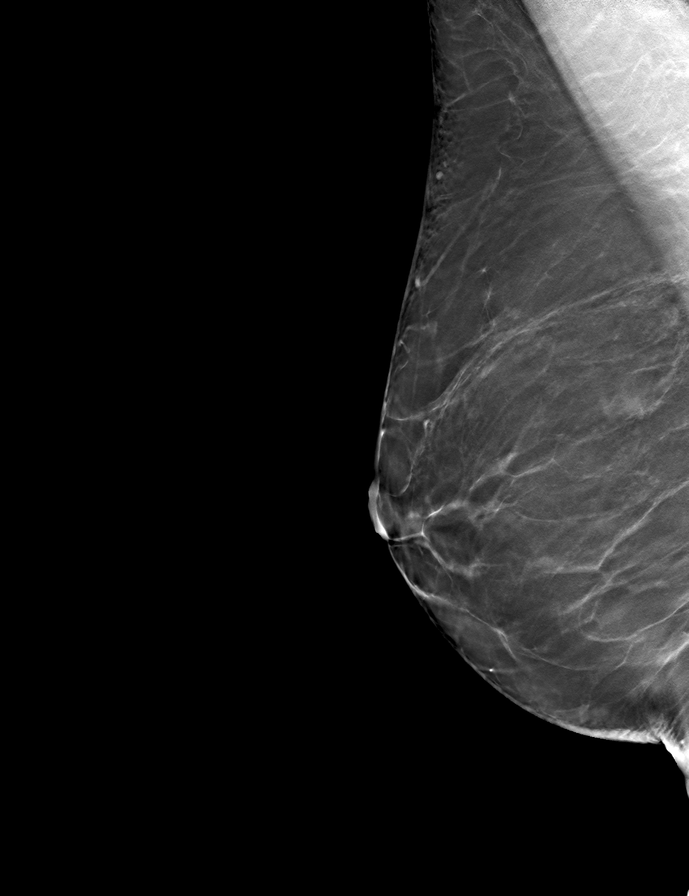
[frame 40/79]
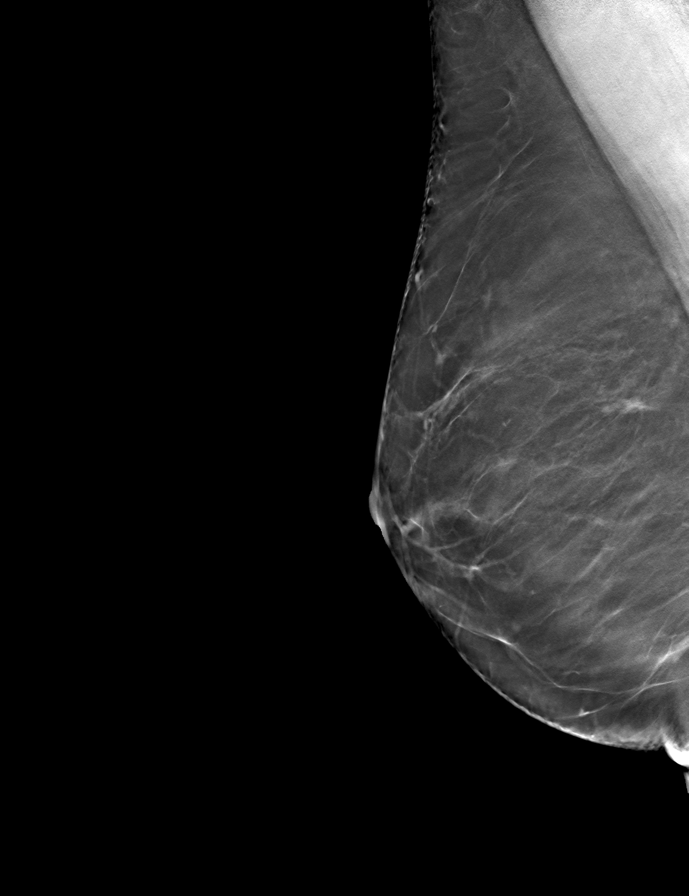

[R CC tomo · tomo slice 39/76.0]
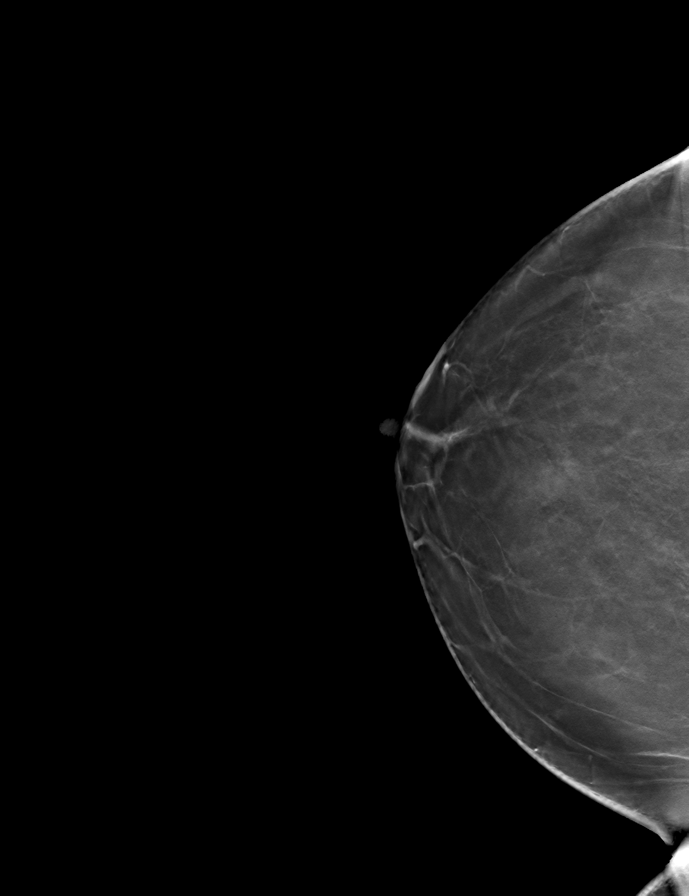

[L MLO tomo · tomo slice 41/80.0]
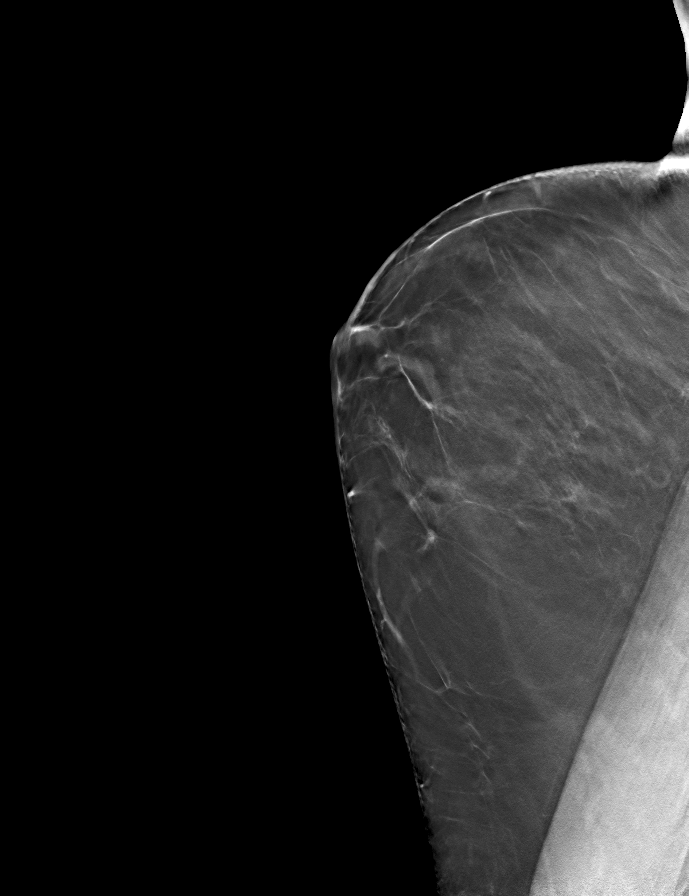

[L CC tomo · tomo slice 37/74.0]
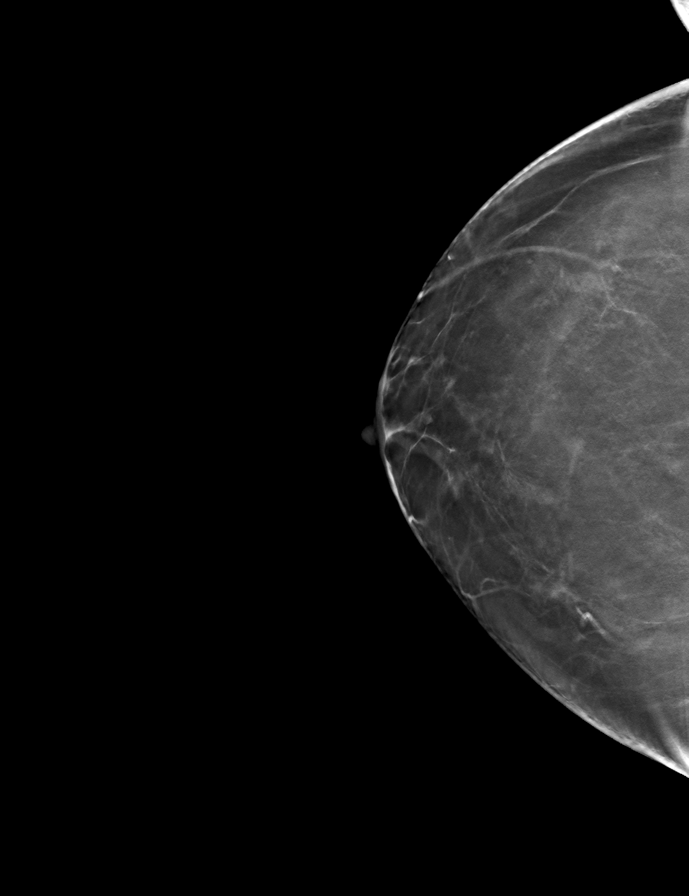

[9 of 24 positions shown; findings below may reference images not displayed]

IMPRESSION: There is no mammographic evidence of malignancy. A 1 year screening mammogram is recommended.
mwm/penrad:12/29/2015 [DATE]
letter sent: Normal
Mammogram BI-RADS: 2 Benign

## 2016-05-29 IMAGING — CR HAND LT 3 VWS MIN
1 series · 3 of 3 positions shown · non-contrast
Comparison: none

INDICATION: Finger injury

[Series 1: pa · 0.17mm/px · 3 of 3 slices shown]
[im 1/3]
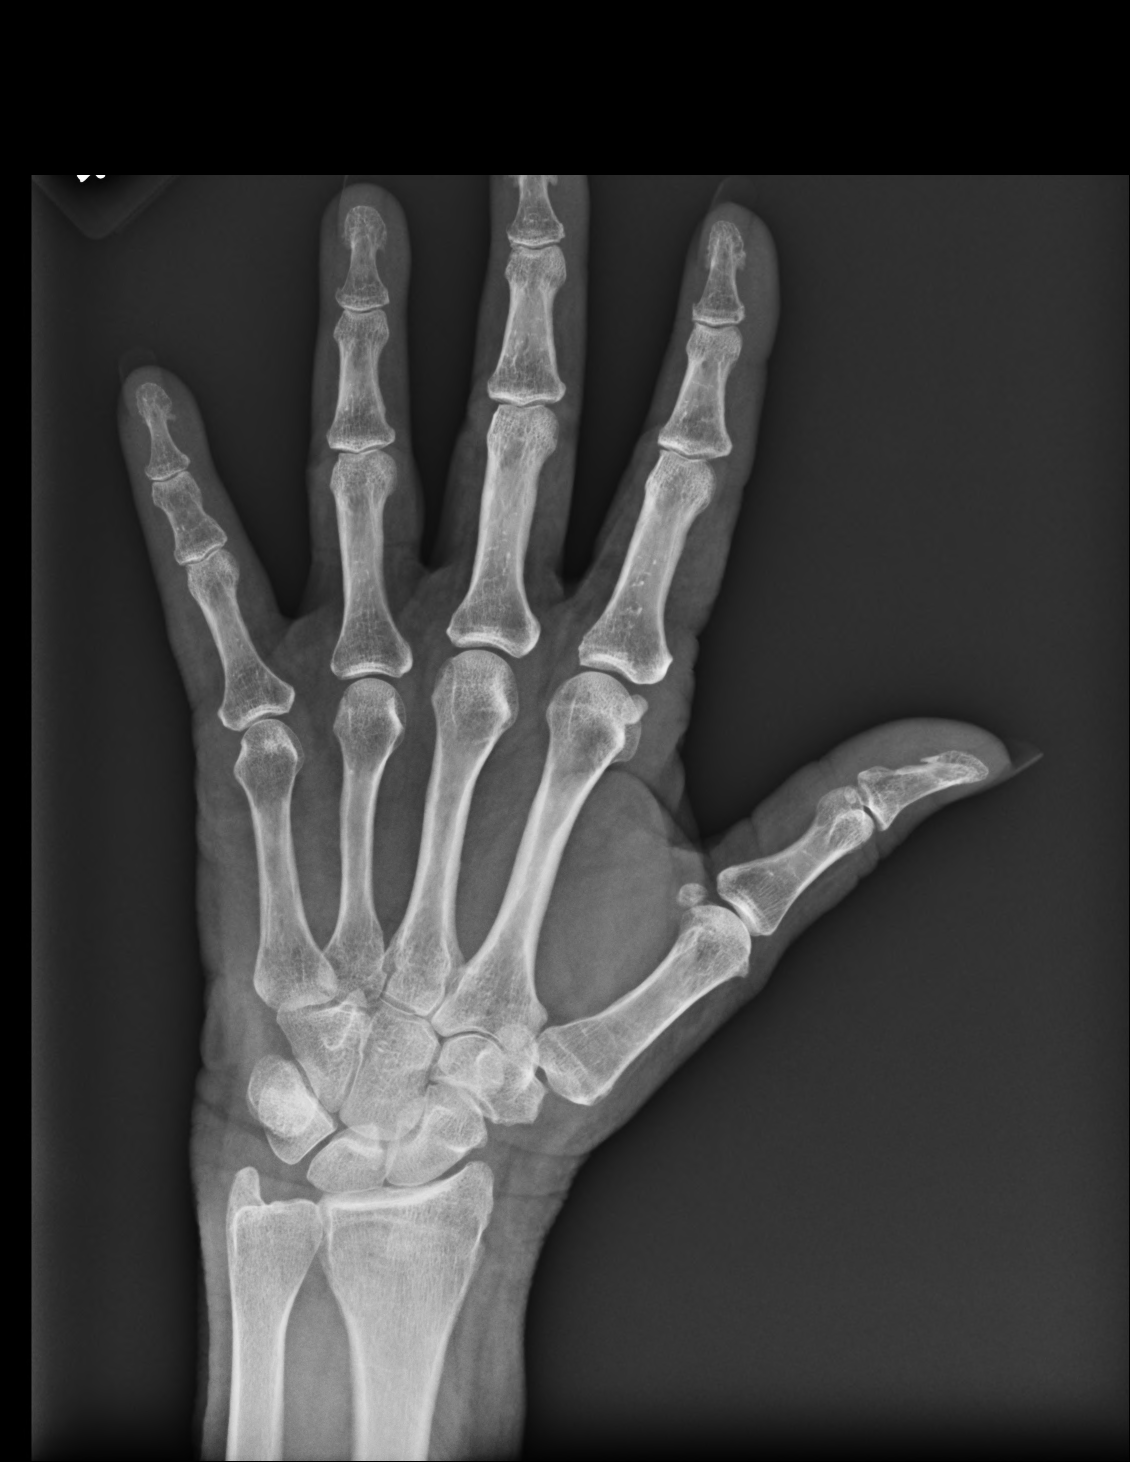
[im 2/3]
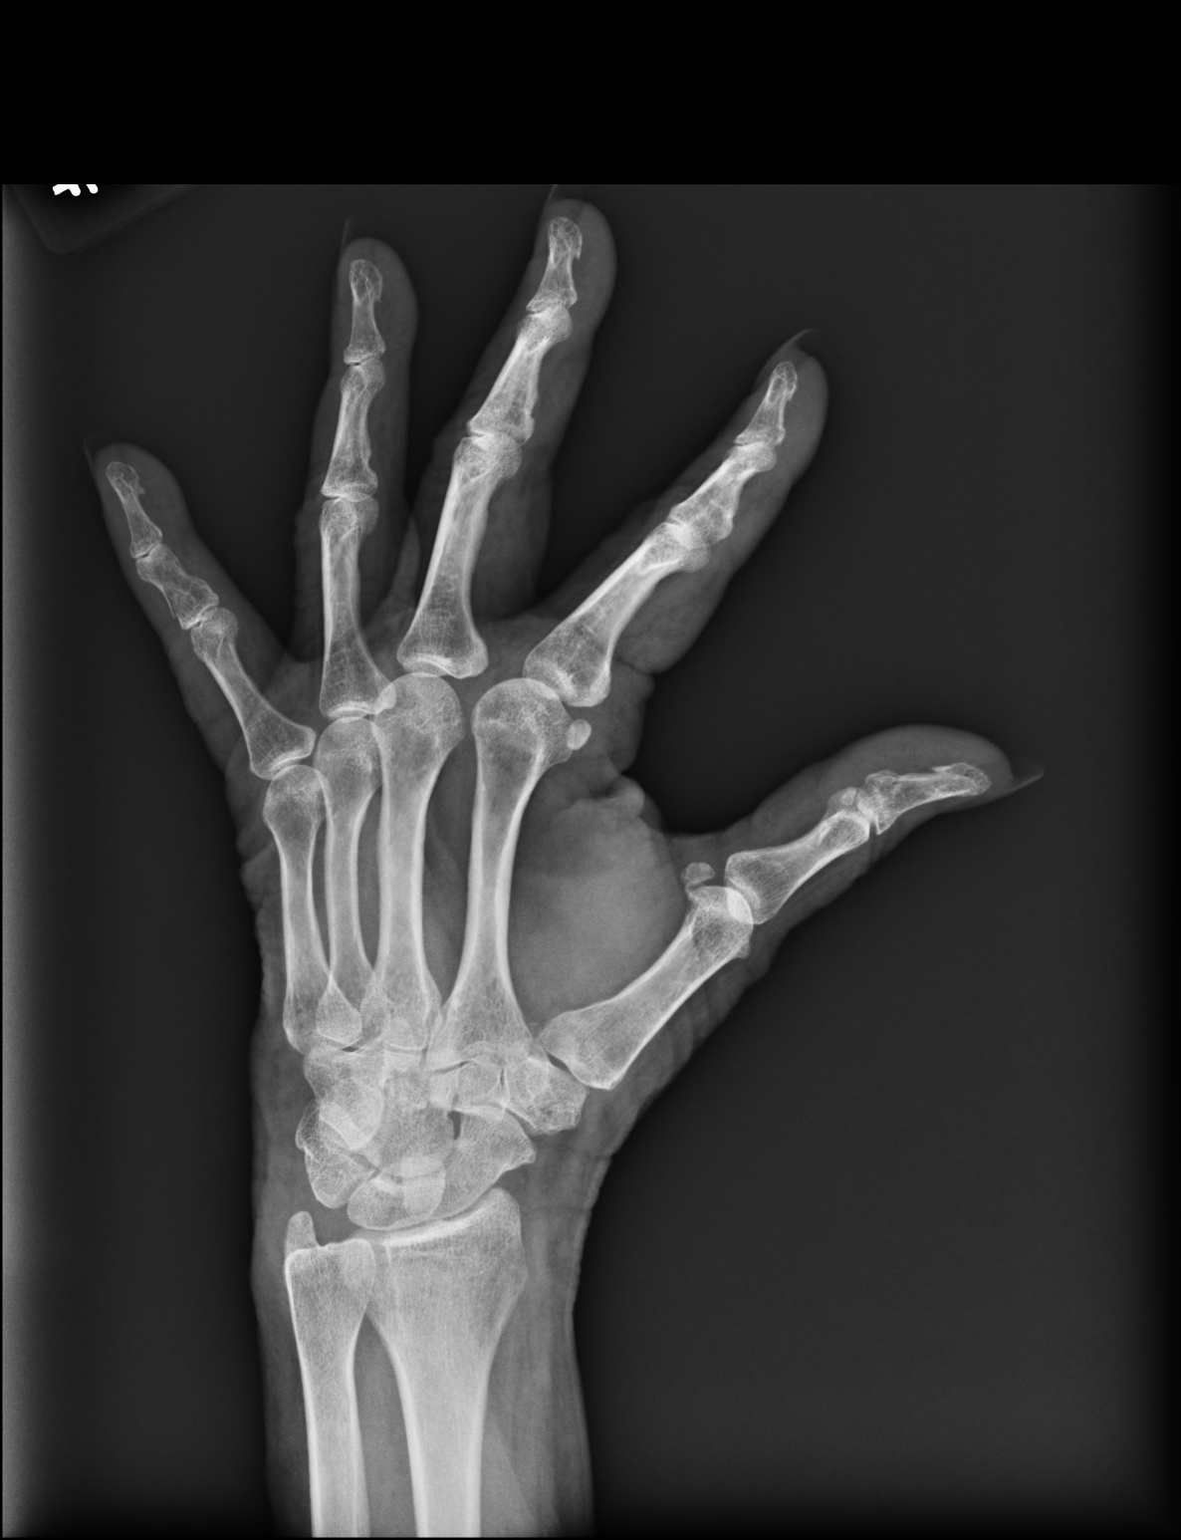
[im 3/3]
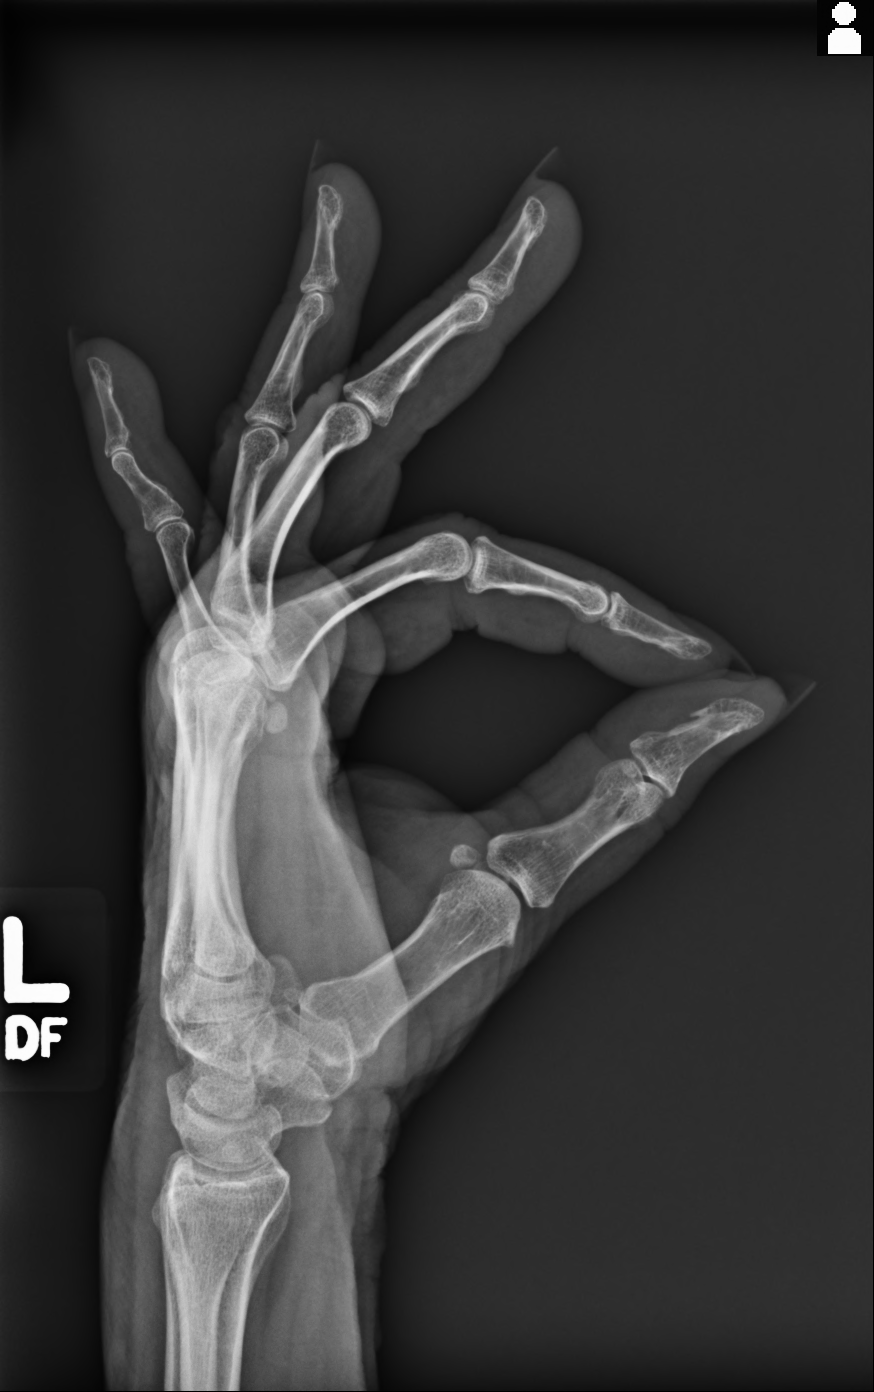

[3 of 3 positions shown; findings below may reference images not displayed]

FINDINGS: 3 views of the left hand do not demonstrate a fracture. There is no dislocation. No focal soft tissue abnormality is identified.
IMPRESSION: Negative

## 2017-03-18 IMAGING — PT PET^1_PETCT_UltraHD (Adult)
1 of 3 series · 1 of 25 positions shown · non-contrast
Comparison: [HOSPITAL] CT of the chest from 02/24/2009 and CT of the chest dated 01/17/2017 from [facility].

Height: 66 inches. Weight: 152.0 pounds.
INDICATION: Lung nodule at the lateral upper right lung seen on recent CT. Recent negative right upper lung washings on cytology dated 02/28/2017. No current tissue biopsy. PET/CT evaluation desired. Patient is a 40 pack-year smoker, still actively smoking but trying to quit.
TECHNIQUE: The patient's serum glucose was 146 mg/dL at time of the study.  The patient was intravenously injected with 11.48 mCi F-18 fluorodeoxyglucose in a left antecubital fossa vein.  The patient rested quietly for 64 minutes and then received attenuation corrected PET/CT imaging with Time-of-Flight Protocol from the skull base to mid thigh. PET, CT, and fused images were reviewed at the reading station. SUV is corrected based on lean body mass. Images are adequate for review.

[Series 3: pet ac · axial · 5.0mm · 4.07mm/px · 1 of 278 slices shown]
[im 139/278]
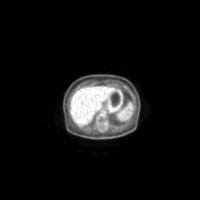

[1 of 25 positions shown; findings below may reference images not displayed]

FINDINGS: HEAD/NECK: 

PET: There is normal distribution of the radiopharmaceutical.

CT:  Prominent metallic dental artifact is seen within the mouth. Low-grade arthritis is noted in the lower cervical spine. Mild calcification is seen at the bilateral carotid bulbs.

THORAX: 

PET: The background mediastinal maximum SUV is 1.7. Nodule of concern at the lateral upper right lung on CT image 79 has maximum SUV of 7.1 and greatest diameter of 13.5 mm. Findings are highly suggestive of primary bronchogenic carcinoma. Compared to the prior CT this appears to develop at the site of a previous scar and suggests scar carcinoma, adenocarcinoma is typical at this location. Based on its peripheral, subpleural location, transthoracic biopsy may have greater success but will be at increased pneumothorax risk due to the patient's emphysematous lung change. No hypermetabolic hilar or mediastinal adenopathy is seen.

CT: Left lung is clear. Heart is not enlarged. No pleural effusions are appreciated. Remaining CT of the thorax is unremarkable for age.

ABDOMEN/PELVIS:

PET:  The background liver mean SUV is 1.6. PERCIST Threshold is 2.6. Physiologic FDG activity is seen within the abdomen and pelvis.

CT: Multiple small calcified gallstones are seen in the gallbladder and appear to nearly pack the gallbladder. Some have low-density centers suggesting cholesterol gallstones. No convincing signs for acute cholecystitis are evident. Calcification of the abdominal aorta is noted without aneurysm. Scattered pelvic phleboliths are noted in the lower pelvis bilaterally. Diverticulosis is noted in the sigmoid colon without acute diverticulitis. Uterus and adnexa are normal in appearance for age.

SKELETAL:

PET: No malignant FDG activity is seen within the skeleton.

CT: Anterior wedge compression fracture of T12 is seen with slight increased sclerosis and FDG activity showing maximum SUV of 2.3. This appears to be insufficiency fracture. There is some also central concavity of the superior endplate of L4 which may represent another compression fracture. Findings are suspicious for underlying osteoporosis in this patient. Osteoarthritis in the joints of the shoulders and joints of the pelvis appear age-related.
IMPRESSION: 1. Today's PET/CT is compared to [HOSPITAL] CT of the chest from 02/24/2009 and CT of the chest dated 01/17/2017 from [facility]. Hypermetabolic upper right lung lateral nodule that appears to have its origin from a previous small scar and appears to be primary bronchogenic lung carcinoma, likely stage I.  Tissue correlation is indicated and may be best obtained with transthoracic CT guided biopsy based on its peripheral location. This puts the patient at increased risk for pneumothorax.

2.  Underlying emphysematous change of the bilateral lungs and low-grade peripheral arterial vascular calcifications consistent with patient's smoking history.

3. Multiple small gallstones within the gallbladder, some of which have central hypodensity suggesting cholesterol gallstones. No signs for acute cholecystitis are evident.

4. Anterior wedge compression fracture of T12 and superior endplate fracture of L4 suggesting underlying osteoporosis. Consider current DXA evaluation of this patient. Previous DXA from 2193 was normal but current findings are suspicious for interval loss of bone mass.

ABNORMAL IMPORTANT FINDINGS!

PERCIST reporting definitions and therapy response criteria, please click link below.

[URL]

## 2017-09-01 IMAGING — MG MAMMO SCRN BIL W/CAD TOMO
8 of 13 series · 8 of 29 positions shown · non-contrast
Comparison: Comparison is made to exams dated:  12/28/2015 [HOSPITAL] - [HOSPITAL], 05/05/2013 [facility] - [[facility]], 04/01/2013, and 02/02/2008 [facility] - [HOSPITAL].

Images Obtained from [facility]
INDICATION: Screening mammogram.
TECHNIQUE: Bilateral 2 dimensional digital mammogram was performed, followed by bilateral 3 dimensional mammogram. Current study was also evaluated with a Computer Aided Detection (CAD) system.
BREAST COMPOSITION: The breasts are almost entirely fatty.

[R CC]
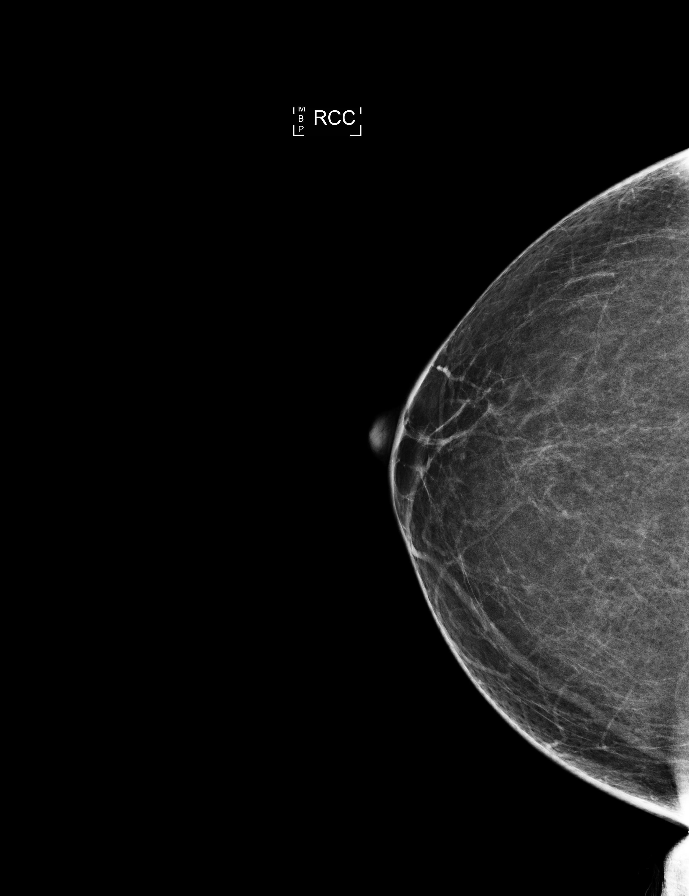

[L MLO (1 of 3)]
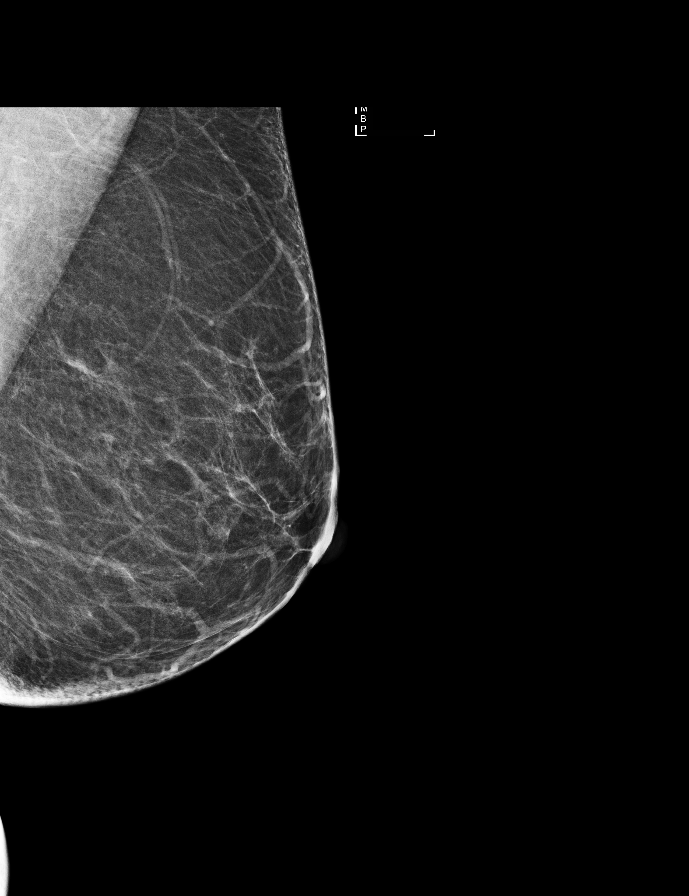

[L CC (1 of 3)]
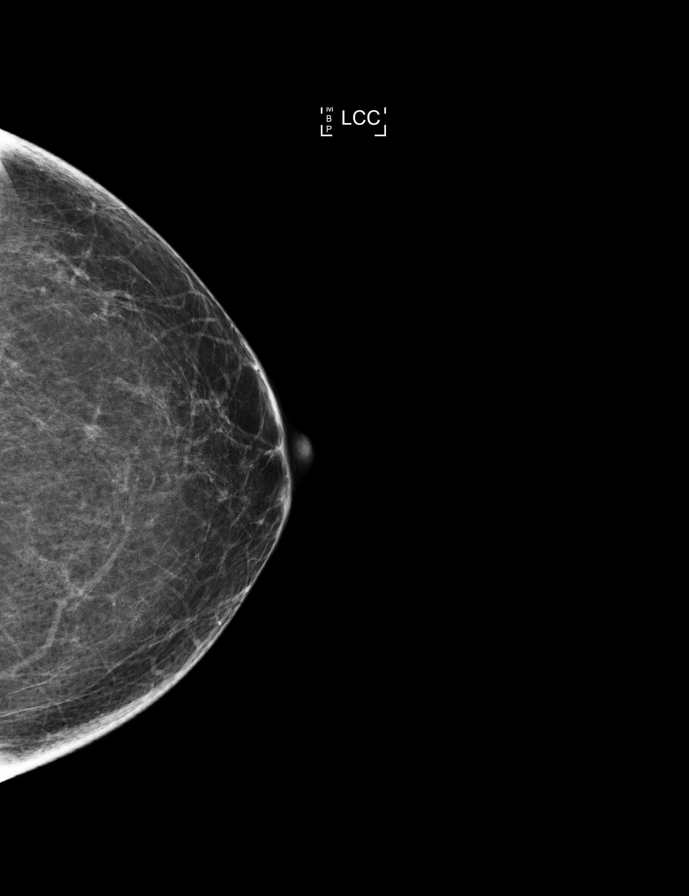

[R MLO]
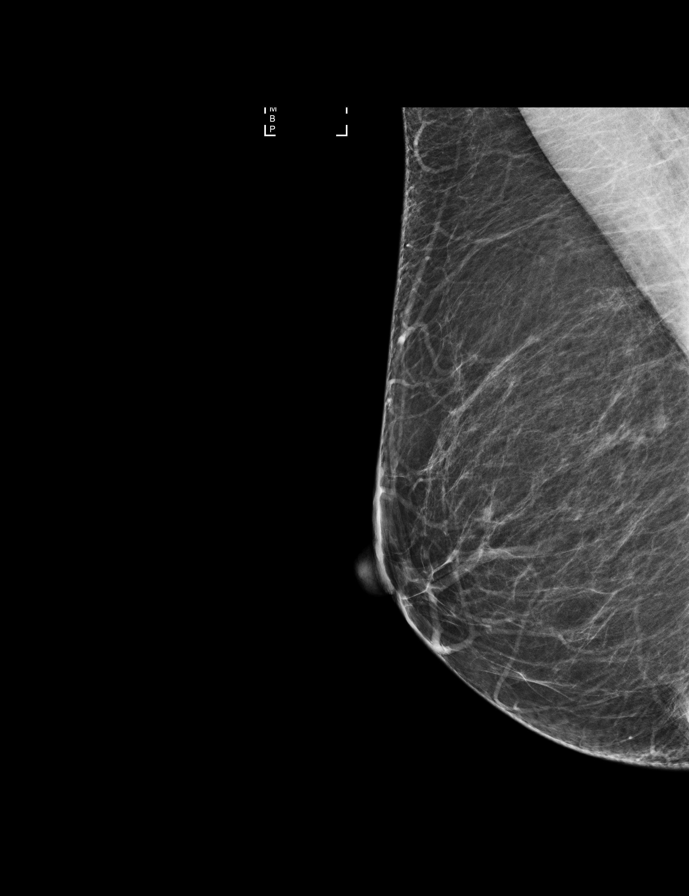

[L CC (2 of 3)]
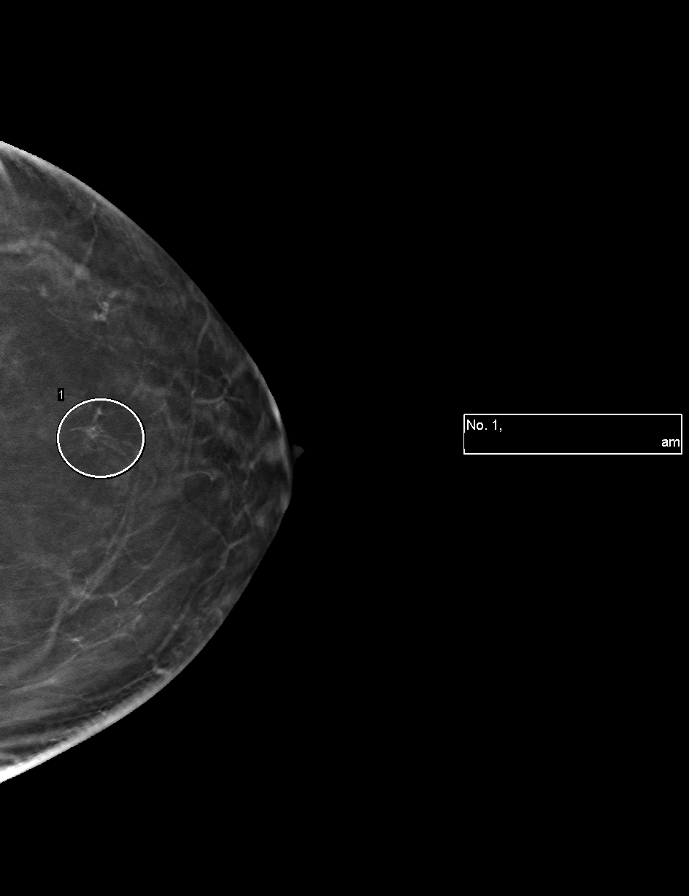

[L CC (3 of 3)]
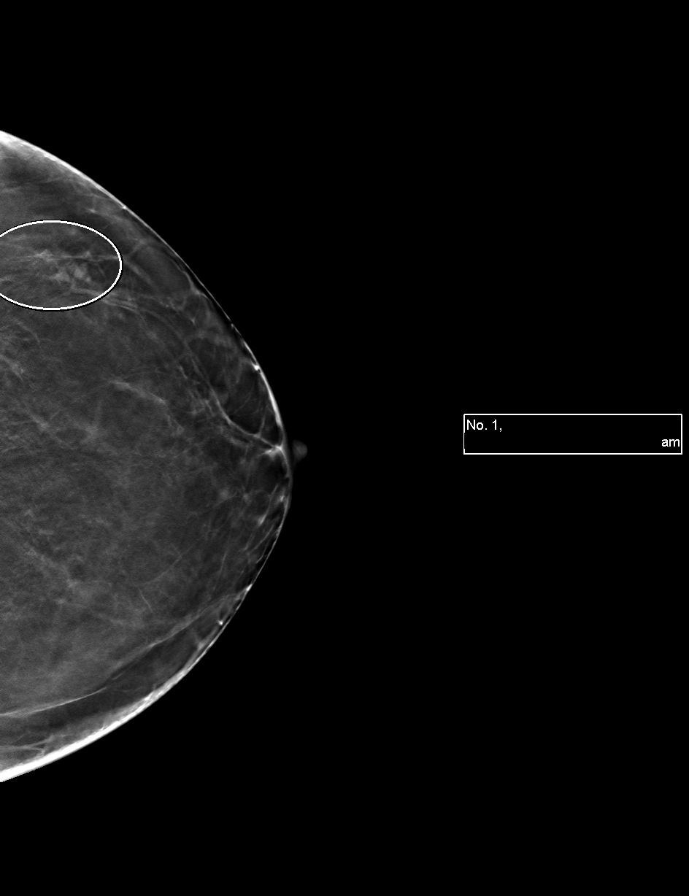

[L MLO (2 of 3)]
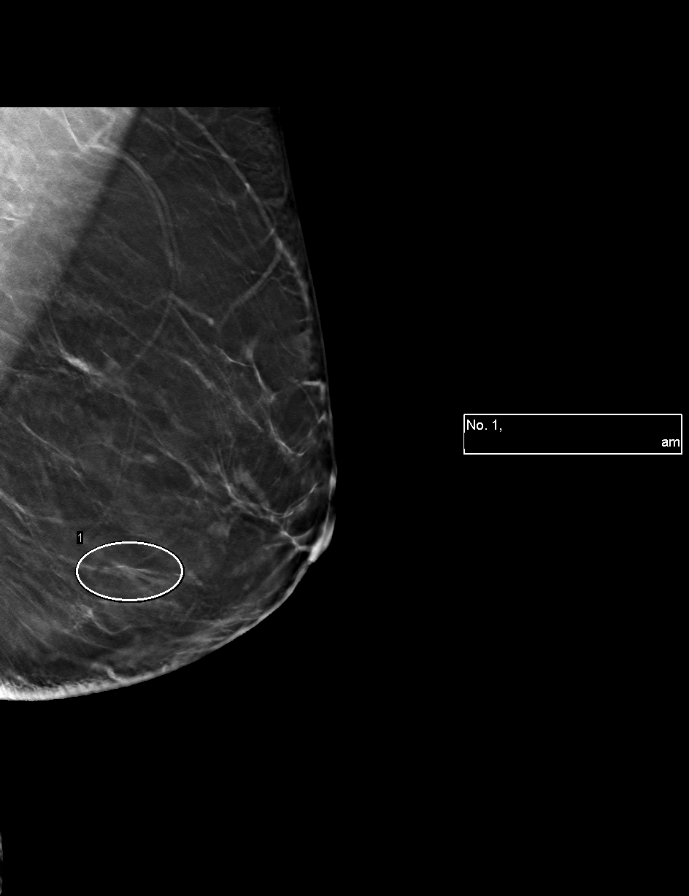

[L MLO (3 of 3)]
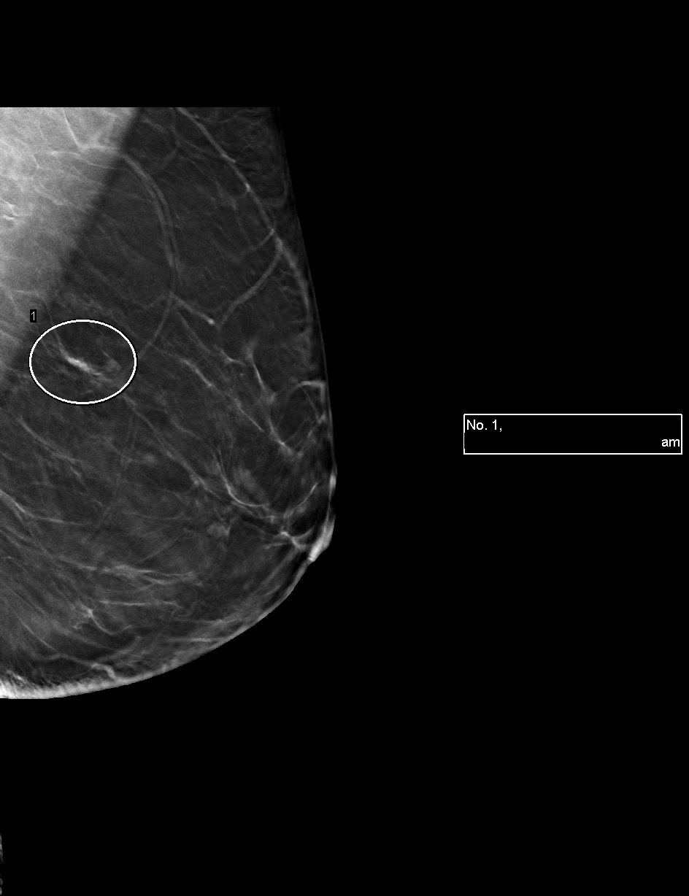

[8 of 29 positions shown; findings below may reference images not displayed]

FINDINGS: There is an irregular focal asymmetry in the left breast at 2 o'clock posterior depth.
There also is an irregular asymmetry in the left breast middle depth central to the nipple seen on the craniocaudal view only.
No other significant masses, calcifications, or other findings are seen in either breast.
IMPRESSION: The irregular focal asymmetry in the left breast at 2 o'clock posterior depth is indeterminate.  Additional views with possible ultrasound are recommended.
The irregular asymmetry in the left breast middle depth central to the nipple seen on the craniocaudal view only is indeterminate.  Additional views with possible ultrasound are recommended.
mc/penrad:09/02/2017 [DATE]
letter sent: Birad 0
FINAL ASSESSMENT: BI-RADS:Category 0: Incomplete: Need Additional Imaging Evaluation

## 2017-09-11 IMAGING — US US BREAST LT LTD
1 series · 14 of 24 positions shown · non-contrast
Comparison: Comparison is made to exams dated:  09/01/2017 [HOSPITAL] - [HOSPITAL], 12/28/2015 [HOSPITAL] - [HOSPITAL], 05/05/2013 [facility], and 04/01/2013 [HOSPITAL] - [HOSPITAL].

Images Obtained from [facility]
INDICATION: Patient returns today to evaluate an architectural distortion in the left breast.
TECHNIQUE: Left 2 dimensional digital mammogram was performed, followed by left 3 dimensional mammogram.  Color flow and real-time ultrasound of the left breast four quadrants, retroareolar, and
axilla regions were performed.  Gray scale images of the real-time examination were reviewed.

[Series 1: us breast left ltd · 14 of 24 slices shown]
[im 1/24]
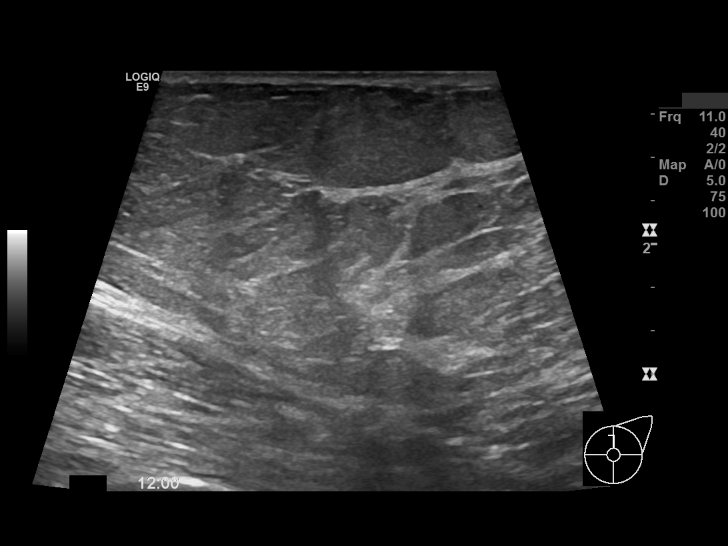
[im 3/24]
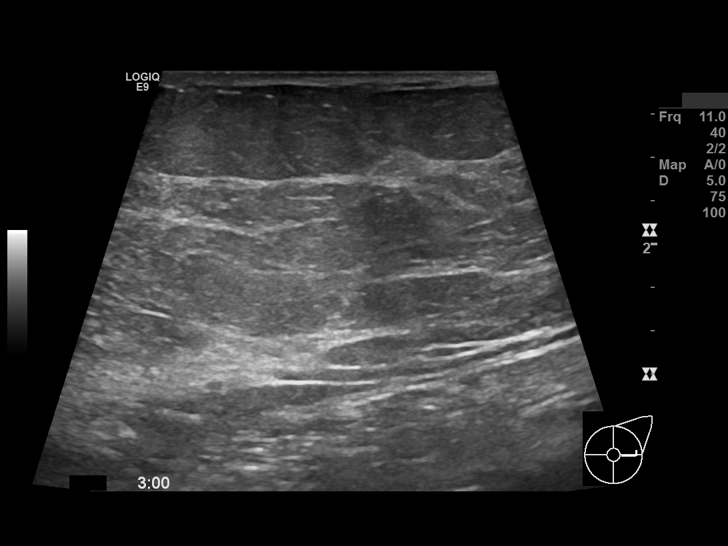
[im 5/24]
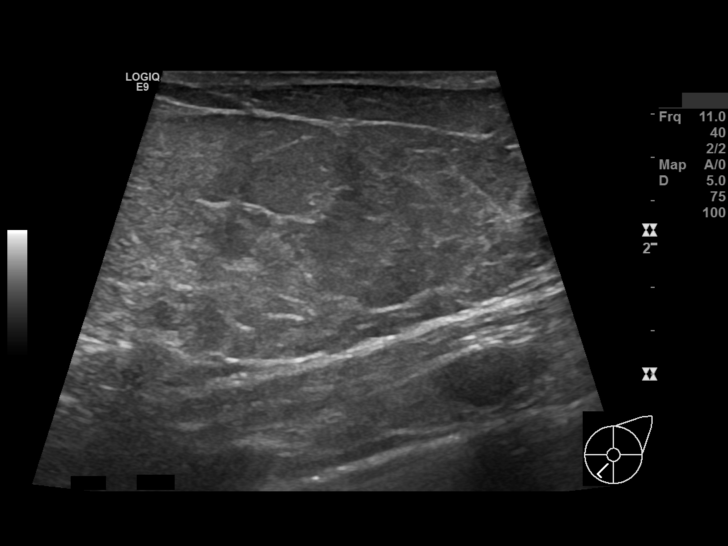
[im 7/24]
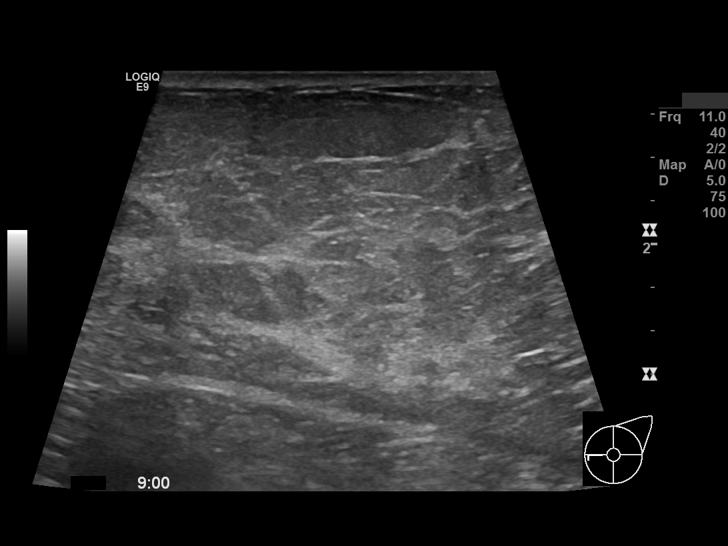
[im 8/24]
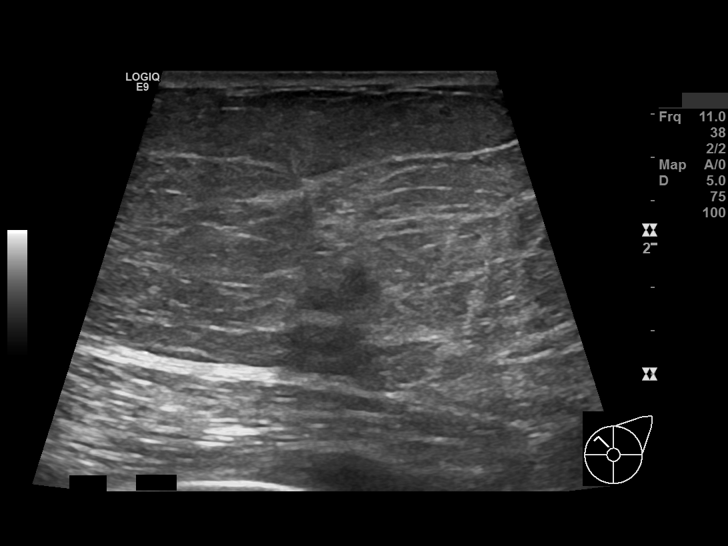
[im 10/24]
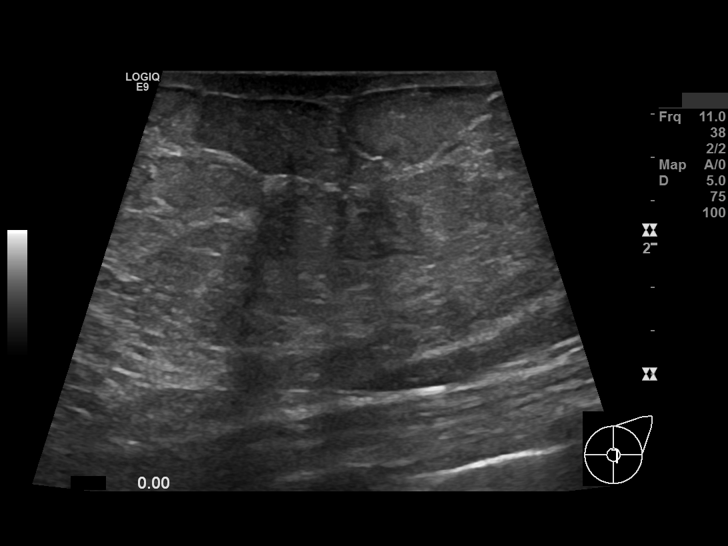
[im 12/24]
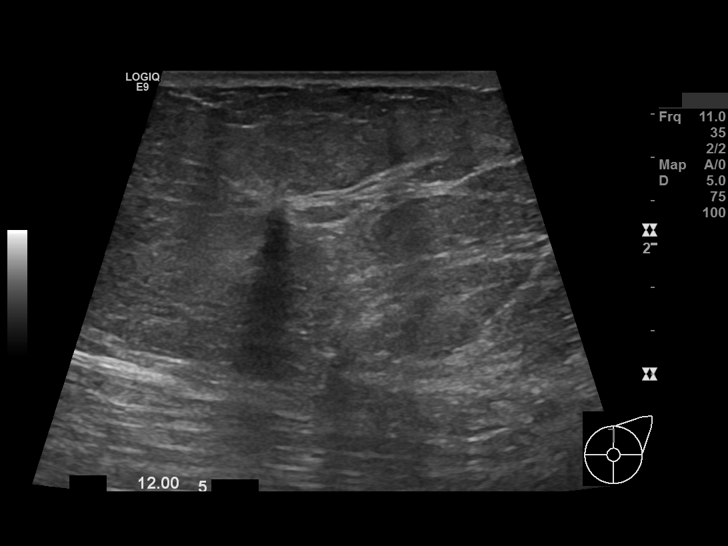
[im 13/24]
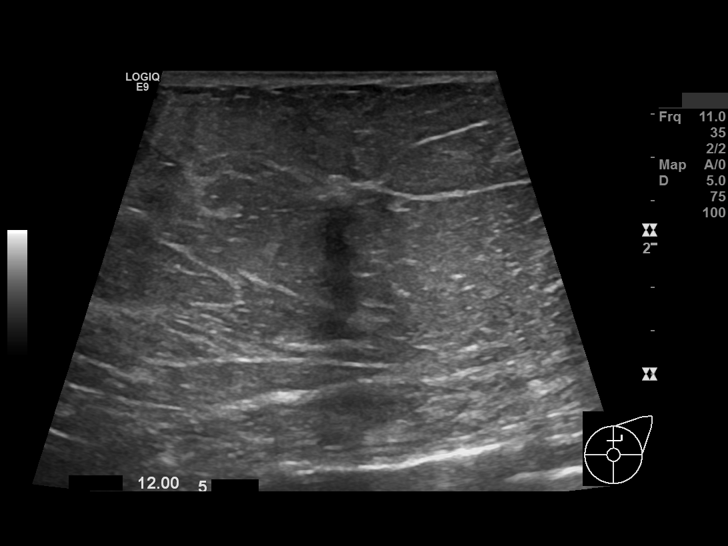
[im 15/24]
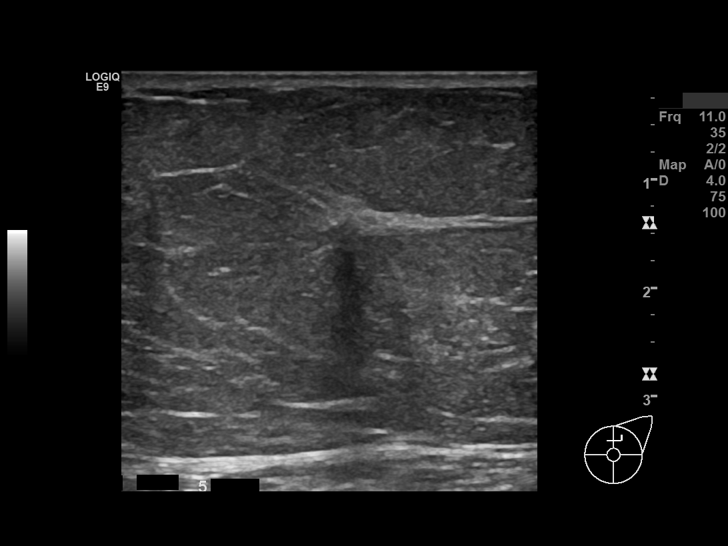
[im 17/24]
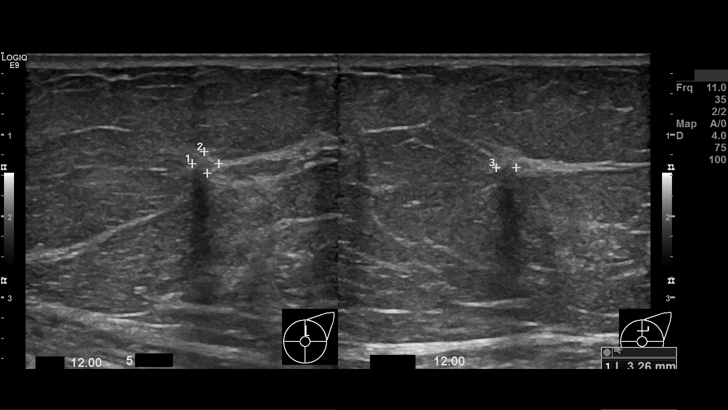
[im 19/24]
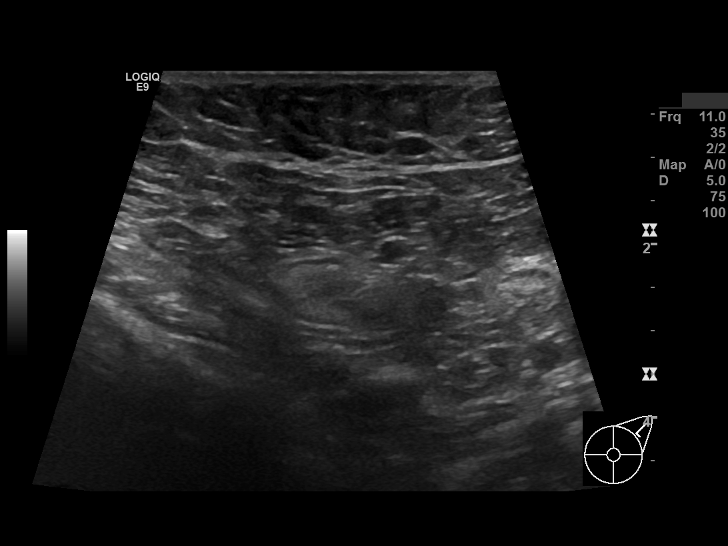
[im 20/24]
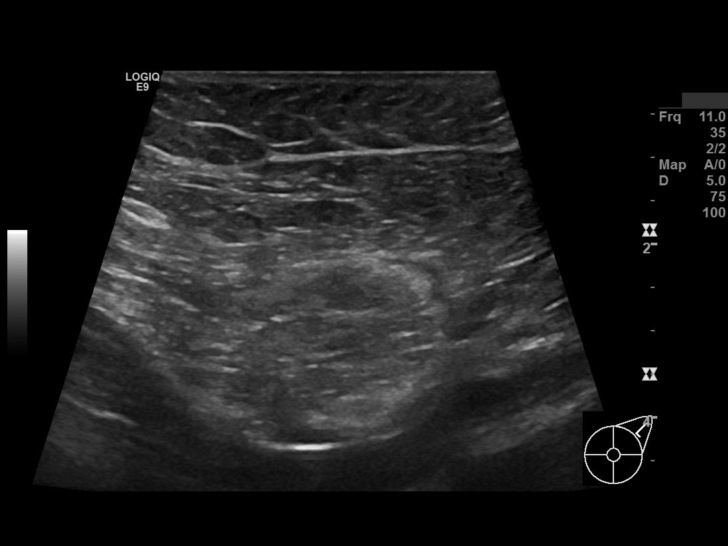
[im 22/24]
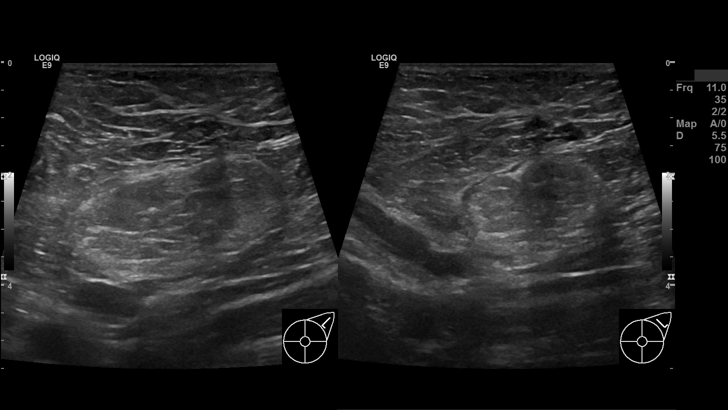
[im 24/24]
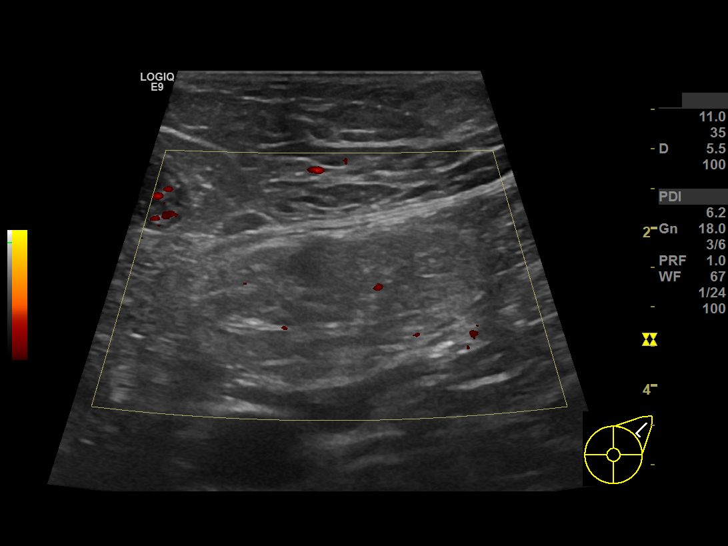

[14 of 24 positions shown; findings below may reference images not displayed]

FINDINGS: Left diagnostic mammogram: Additional spot tomosynthesis views were performed of the left breast to evaluate architectural distortion seen on screening recent screening mammogram. Again seen is small
subtle architectural distortion in the left breast at 12 o'clock middle depth, best appreciated on CC tomosynthesis slice 45.
Left targeted ultrasound: Targeted ultrasound in the area of mammographic concern demonstrates an area of subtle shadowing in the left breast at 12 o'clock, 5 cm from nipple, middle depth. There is
no associated vascularity on color Doppler imaging. No suspicious adenopathy seen within the left axilla.
IMPRESSION: Small subtle architectural distortion in the left breast at 12 o'clock, middle depth is suspicious of malignancy.  A stereotactic biopsy is recommended.
SUMMARY: Findings and recommendations were discussed with the patient and she plans to schedule a biopsy. The patient received a written copy of the results at the end of the examination.
mc/:09/11/2017 [DATE]
FINAL ASSESSMENT: BI-RADS:Category 4: Suspicious FINAL ASSESSMENT: BI-RADS:Category 4: Suspicious

## 2017-09-11 IMAGING — MG MAMMO DIAG LT W/TOMO
4 series · 4 of 12 positions shown · non-contrast
Comparison: Comparison is made to exams dated:  09/01/2017 [HOSPITAL] - [HOSPITAL], 12/28/2015 [HOSPITAL] - [HOSPITAL], 05/05/2013 [facility], and 04/01/2013 [HOSPITAL] - [HOSPITAL].

Images Obtained from [facility]
INDICATION: Patient returns today to evaluate an architectural distortion in the left breast.
TECHNIQUE: Left 2 dimensional digital mammogram was performed, followed by left 3 dimensional mammogram.  Color flow and real-time ultrasound of the left breast four quadrants, retroareolar, and
axilla regions were performed.  Gray scale images of the real-time examination were reviewed.

[L CC]
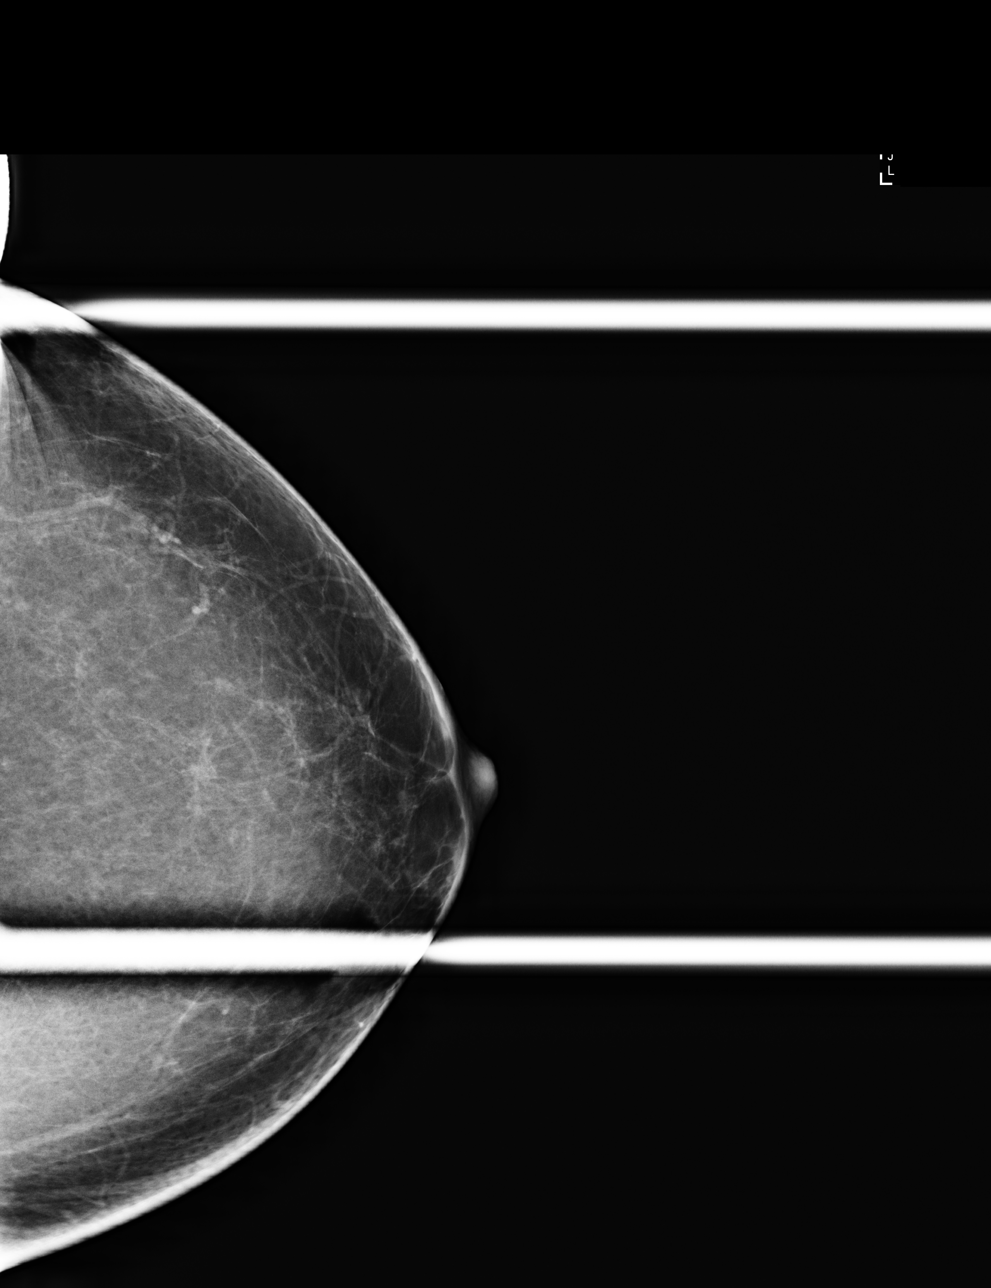

[L MLO]
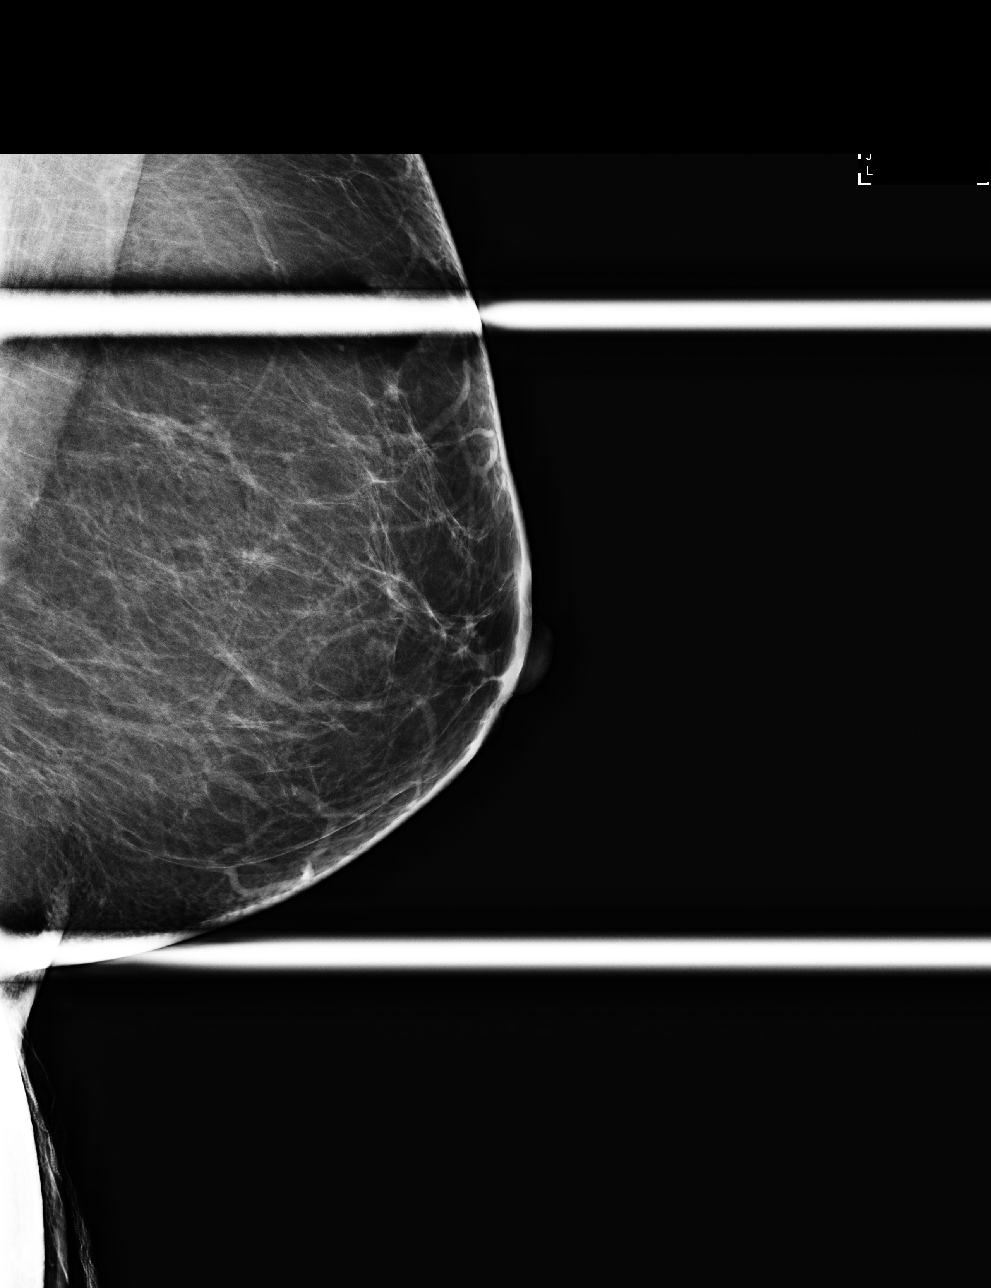

[L CC tomo · tomo slice 39/77.0]
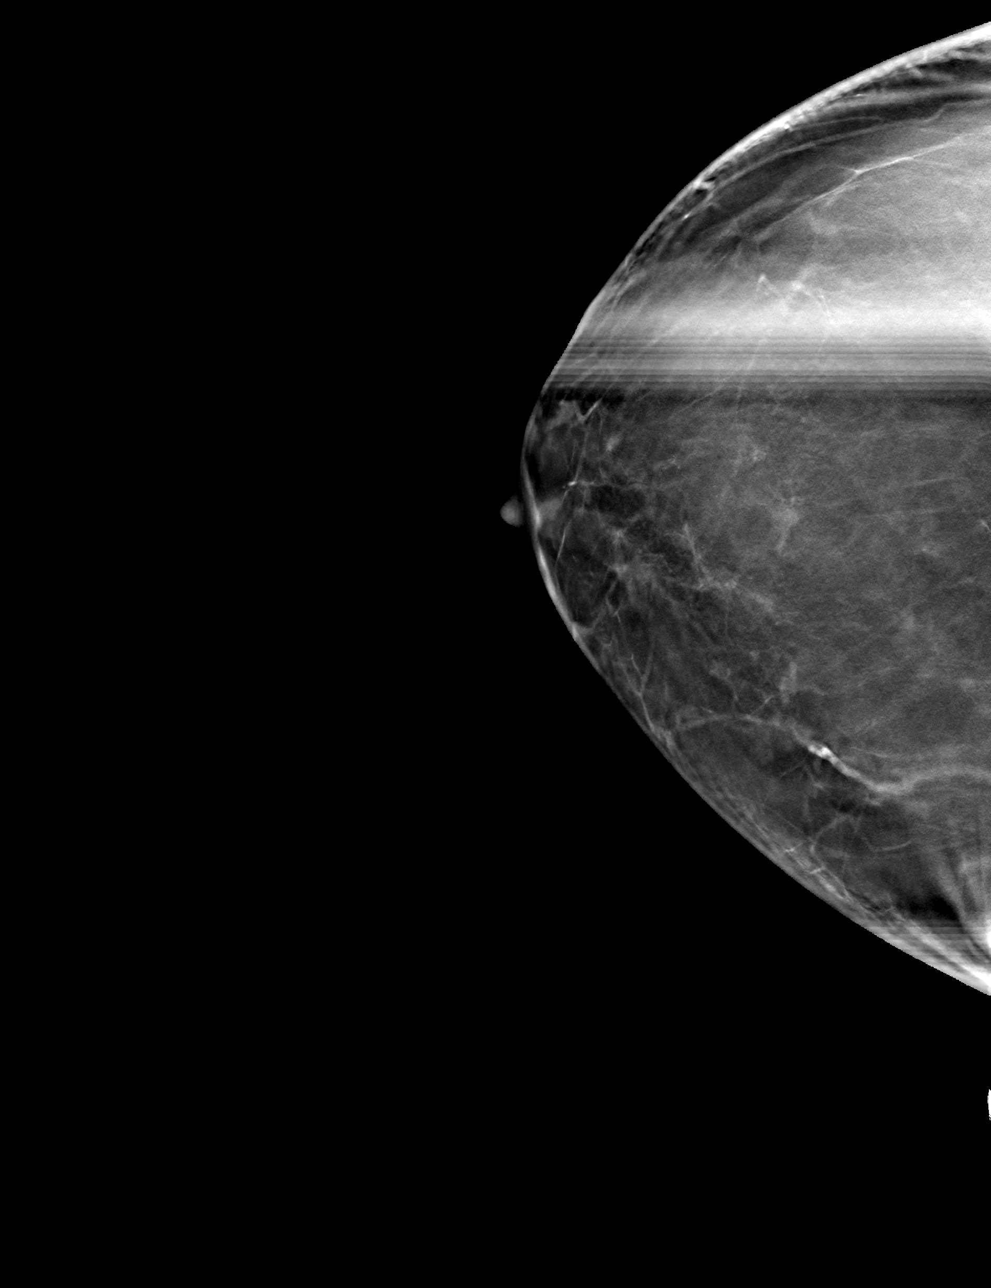

[L MLO tomo · tomo slice 39/78.0]
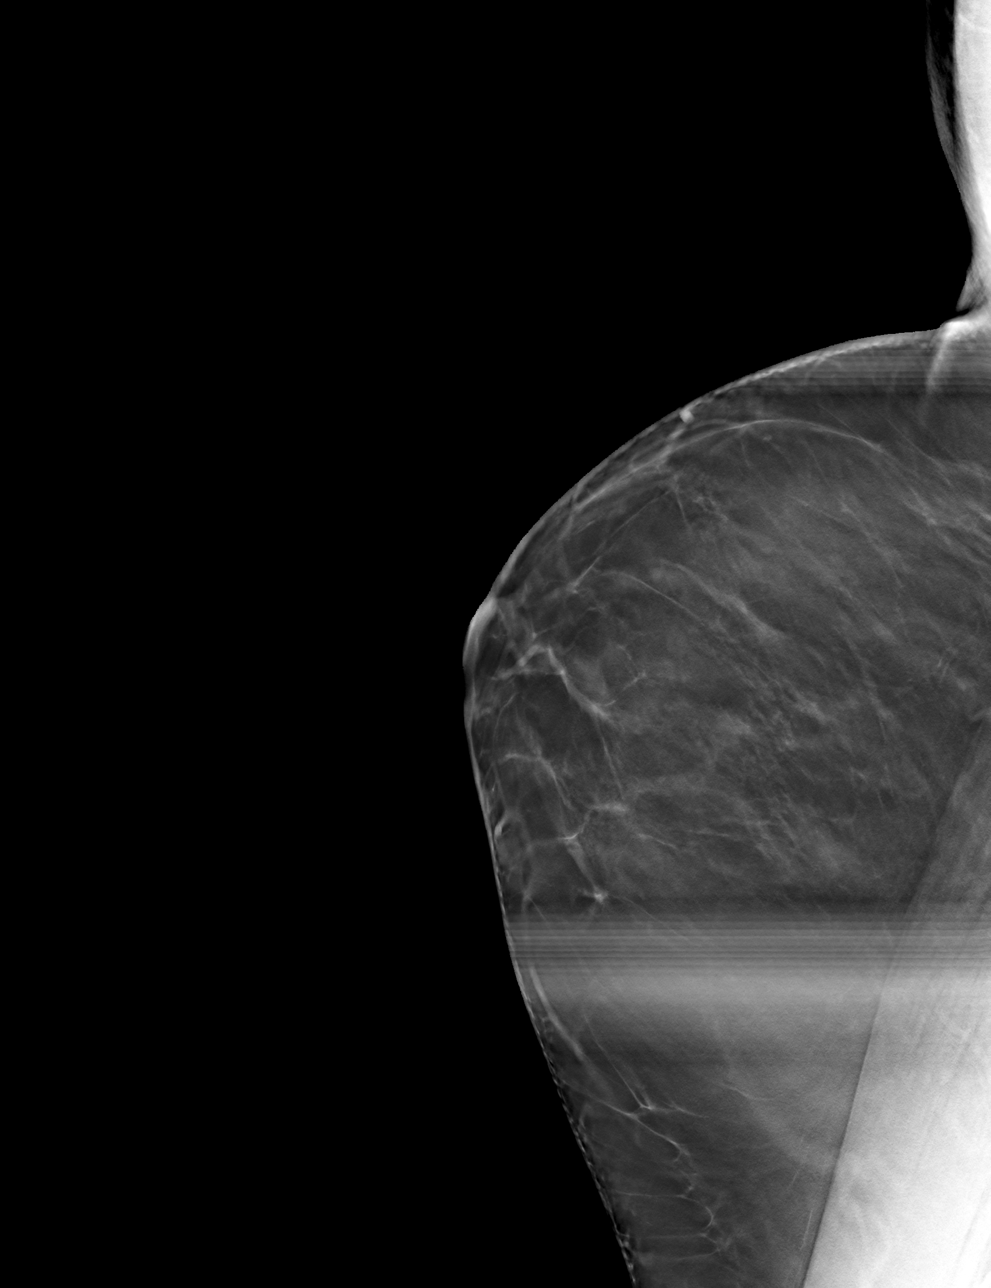

[4 of 12 positions shown; findings below may reference images not displayed]

FINDINGS: Left diagnostic mammogram: Additional spot tomosynthesis views were performed of the left breast to evaluate architectural distortion seen on screening recent screening mammogram. Again seen is small
subtle architectural distortion in the left breast at 12 o'clock middle depth, best appreciated on CC tomosynthesis slice 45.
Left targeted ultrasound: Targeted ultrasound in the area of mammographic concern demonstrates an area of subtle shadowing in the left breast at 12 o'clock, 5 cm from nipple, middle depth. There is
no associated vascularity on color Doppler imaging. No suspicious adenopathy seen within the left axilla.
IMPRESSION: Small subtle architectural distortion in the left breast at 12 o'clock, middle depth is suspicious of malignancy.  A stereotactic biopsy is recommended.
SUMMARY: Findings and recommendations were discussed with the patient and she plans to schedule a biopsy. The patient received a written copy of the results at the end of the examination.
mc/:09/11/2017 [DATE]
FINAL ASSESSMENT: BI-RADS:Category 4: Suspicious FINAL ASSESSMENT: BI-RADS:Category 4: Suspicious

## 2017-09-17 IMAGING — MG MAMMO DIAG LT W/TOMO
4 series · 4 of 12 positions shown · non-contrast
Comparison: Comparison is made to exams dated:  09/11/2017 [facility] - [HOSPITAL], 09/01/2017 [HOSPITAL] - [HOSPITAL], 12/28/2015 [HOSPITAL] -
[HOSPITAL], and 05/05/2013 [facility] - [HOSPITAL].

Images Obtained from [facility]
INDICATION: Tissue marker placement post biopsy left breast.
TECHNIQUE: Left 2 dimensional digital mammogram was performed, followed by left 3 dimensional mammogram. Current study was also evaluated with a Computer Aided Detection (CAD) system.
BREAST COMPOSITION: The left breast is almost entirely fatty.

[L LM]
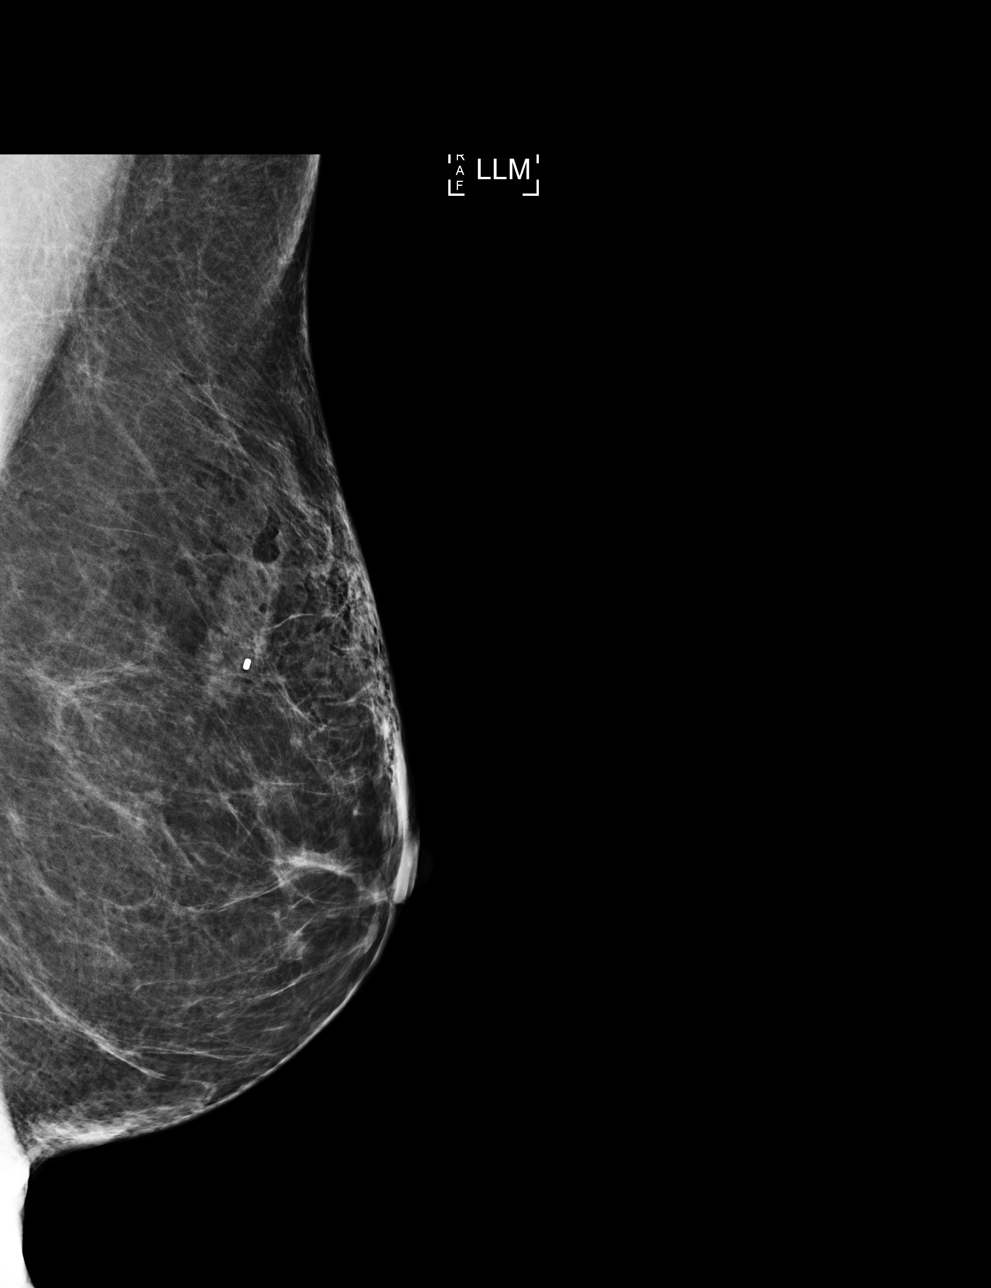

[L CC]
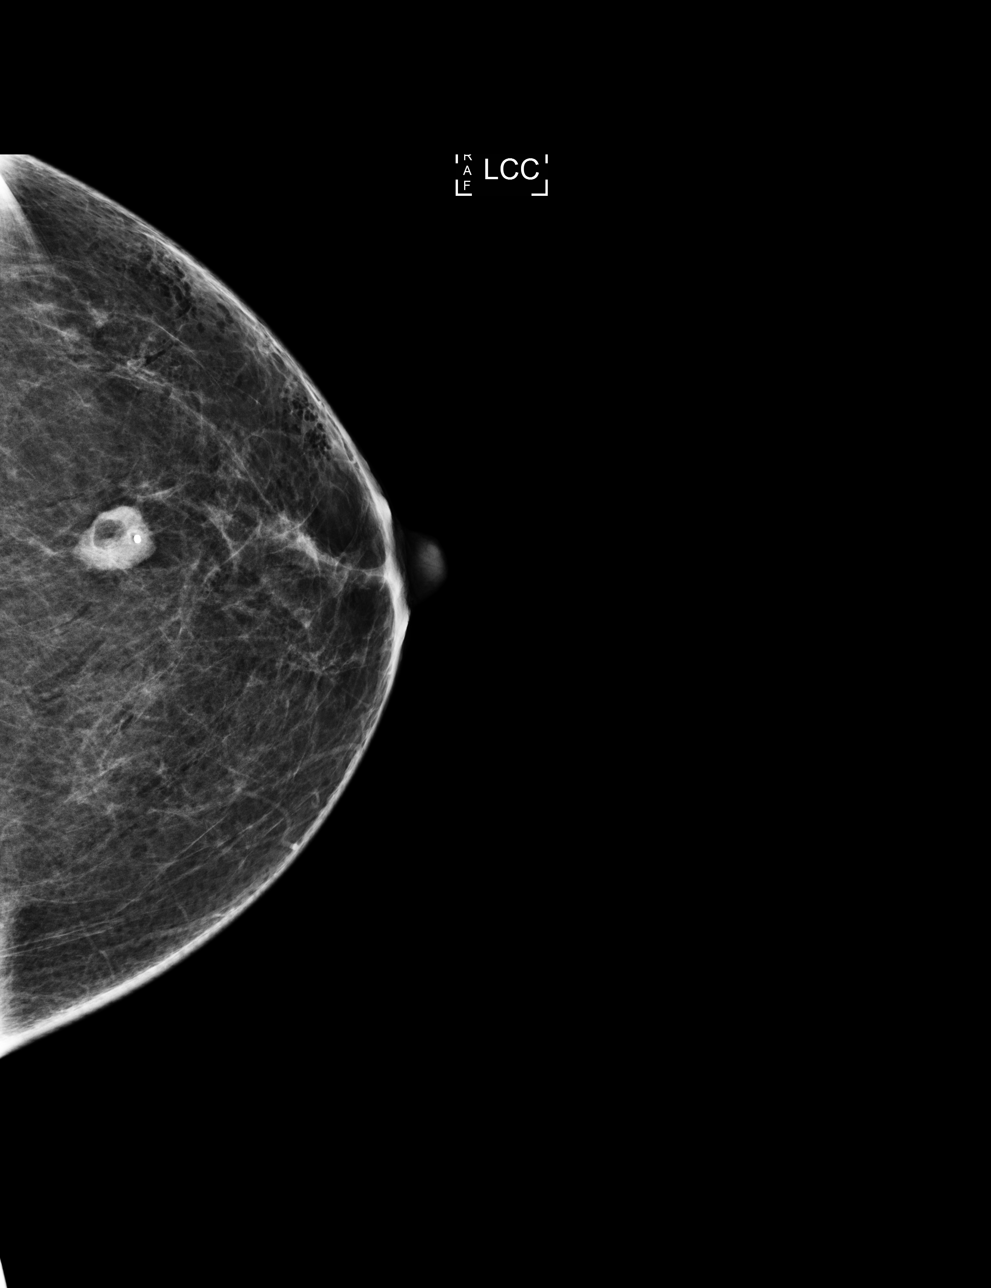

[L CC tomo · tomo slice 40/79.0]
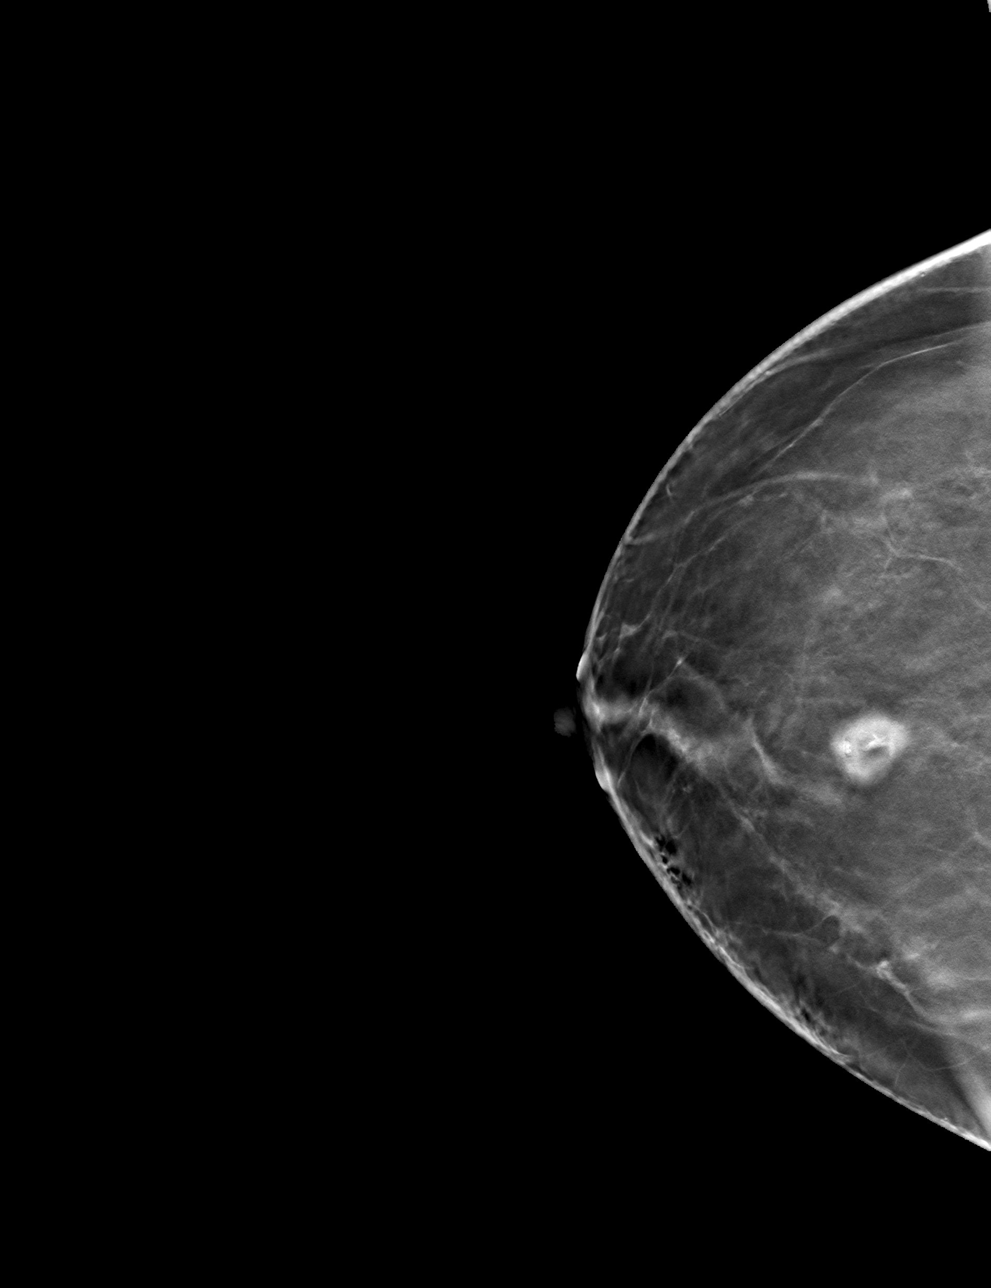

[L LM tomo · tomo slice 45/90.0]
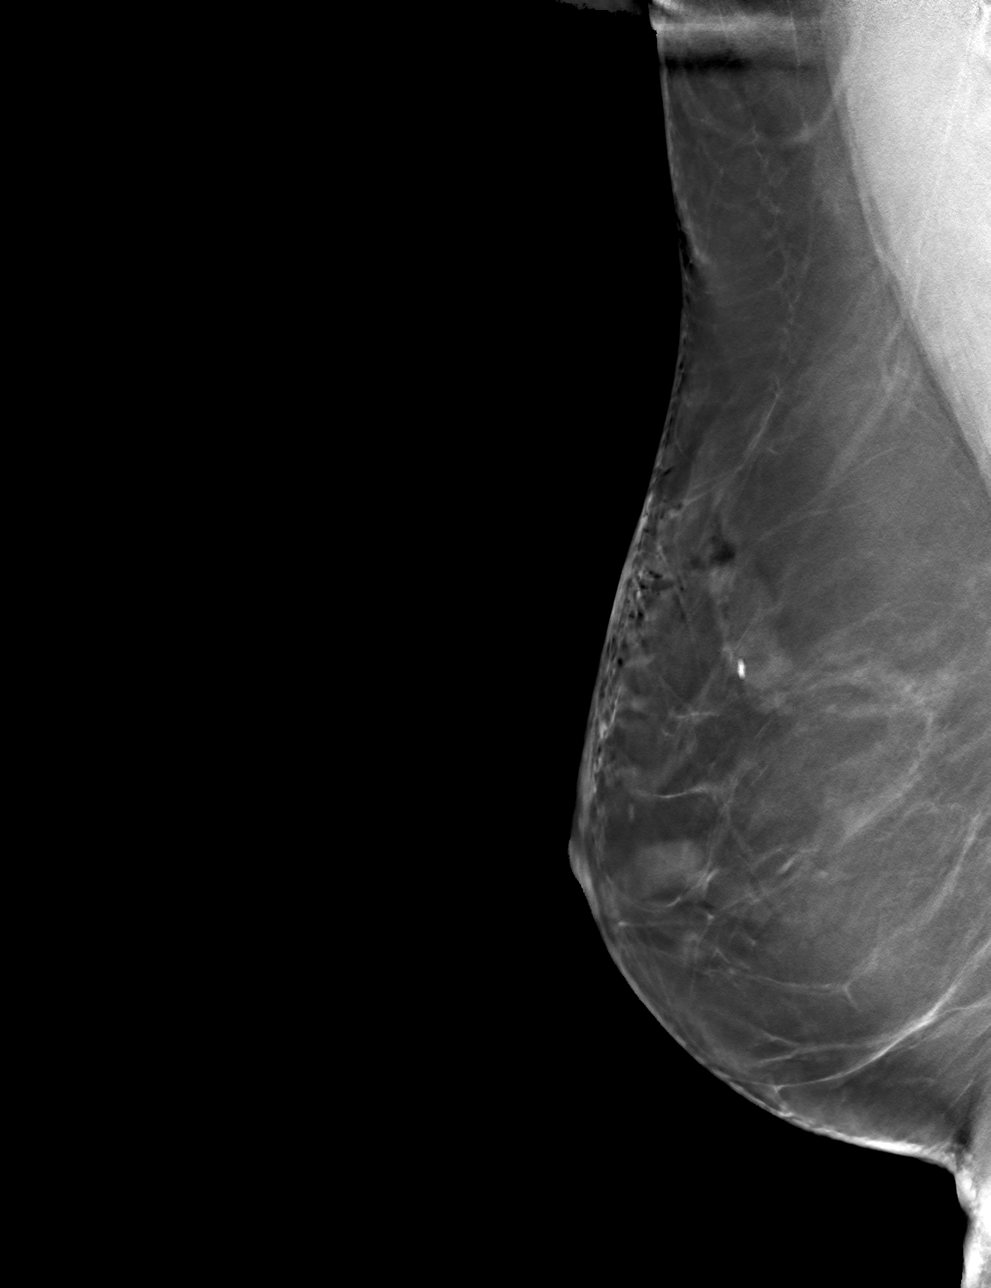

[4 of 12 positions shown; findings below may reference images not displayed]

FINDINGS: Biopsy clip is at the appropriate position and biopsy results are pending.
IMPRESSION: Biopsy clip is at the appropriate position and biopsy results are pending.
mc/penrad:09/17/2017 [DATE]
FINAL ASSESSMENT: BI-RADS:Category Post Procedure Imaging for Marker Placement

## 2017-09-17 IMAGING — MG STEREO BIOPSY LT
7 series · 8 of 15 positions shown · non-contrast
Comparison: Comparison is made to exams dated:  09/11/2017 [facility] - [HOSPITAL], 09/01/2017 [HOSPITAL] - [HOSPITAL], 12/28/2015 [HOSPITAL] -
[HOSPITAL], 05/05/2013 [facility] - [HOSPITAL], and 04/01/2013 [HOSPITAL] - [HOSPITAL].

Images Obtained from [facility]
INDICATION: Stereo biopsy left breast.

[L CC (1 of 5)]
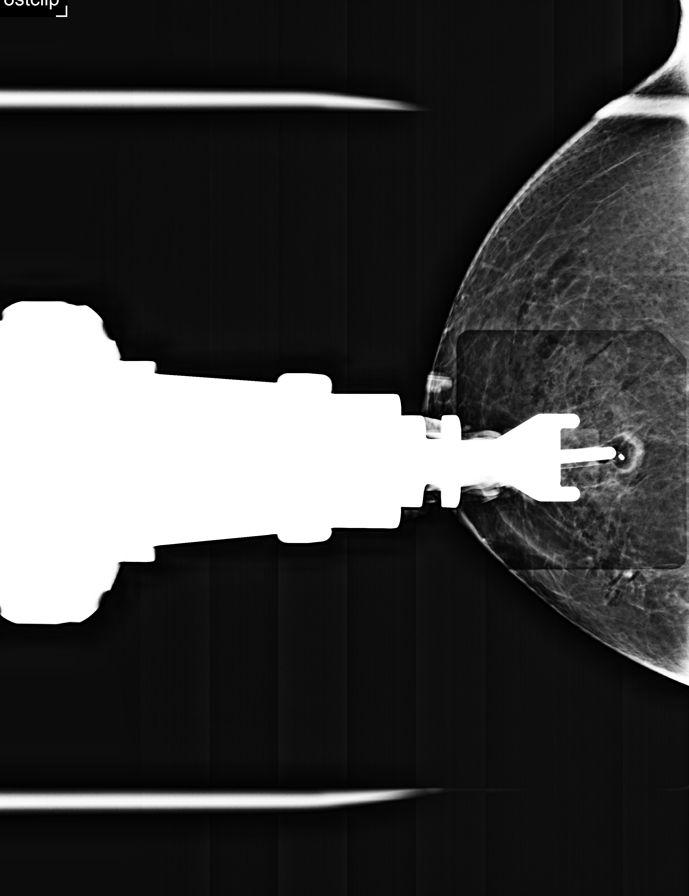

[L CC (2 of 5)]
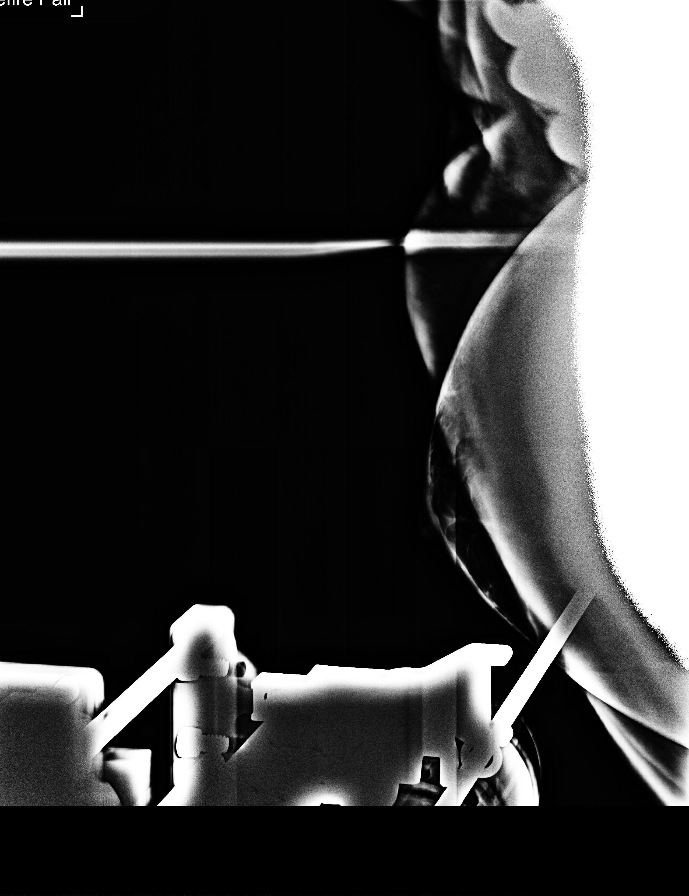

[L CC (3 of 5)]
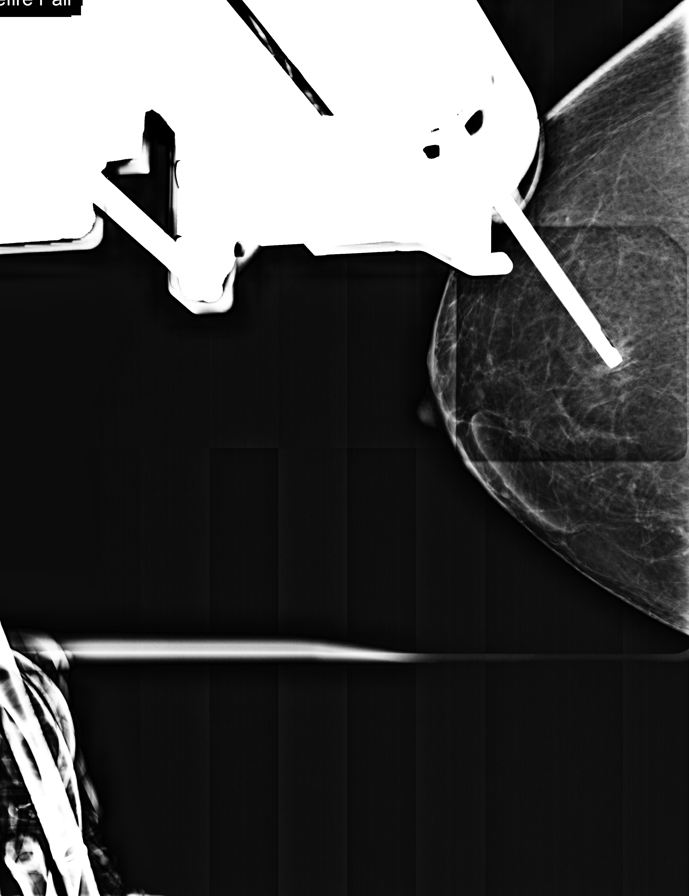

[L CC (4 of 5)]
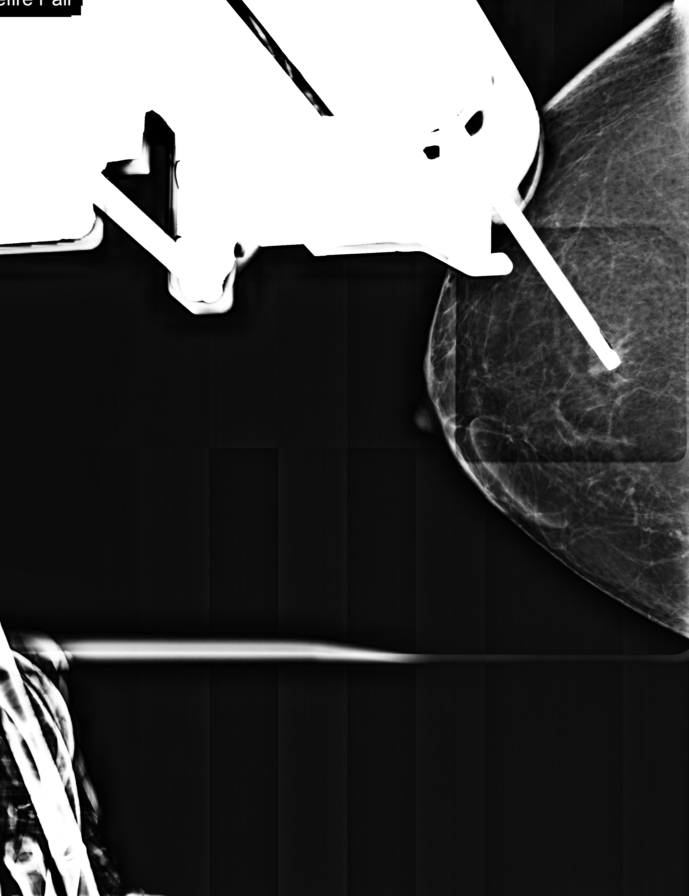

[L CC (5 of 5)]
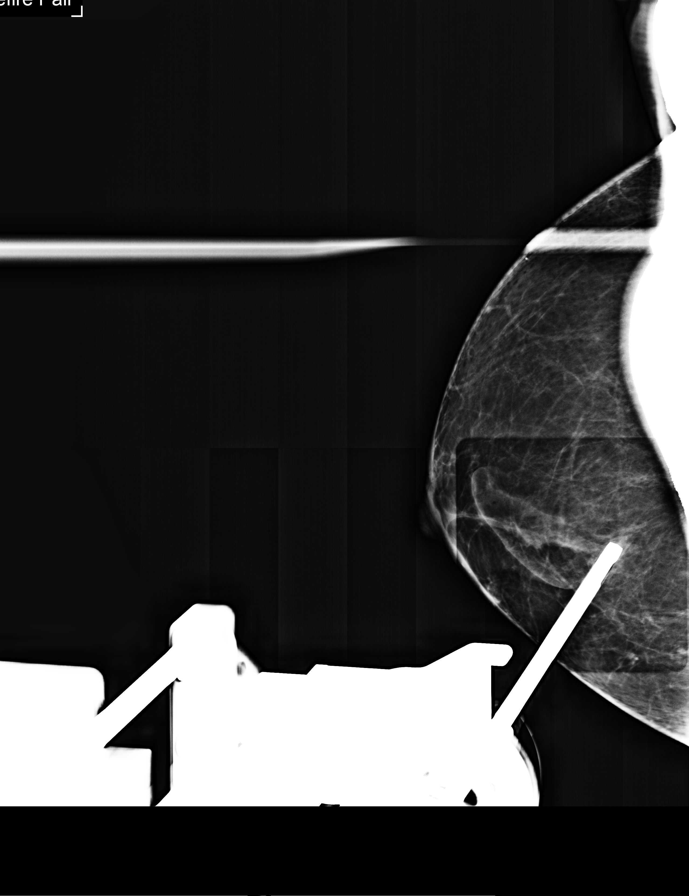

[L CC tomo · 2 of 74 frames shown (1 of 2)]
[frame 24/74]
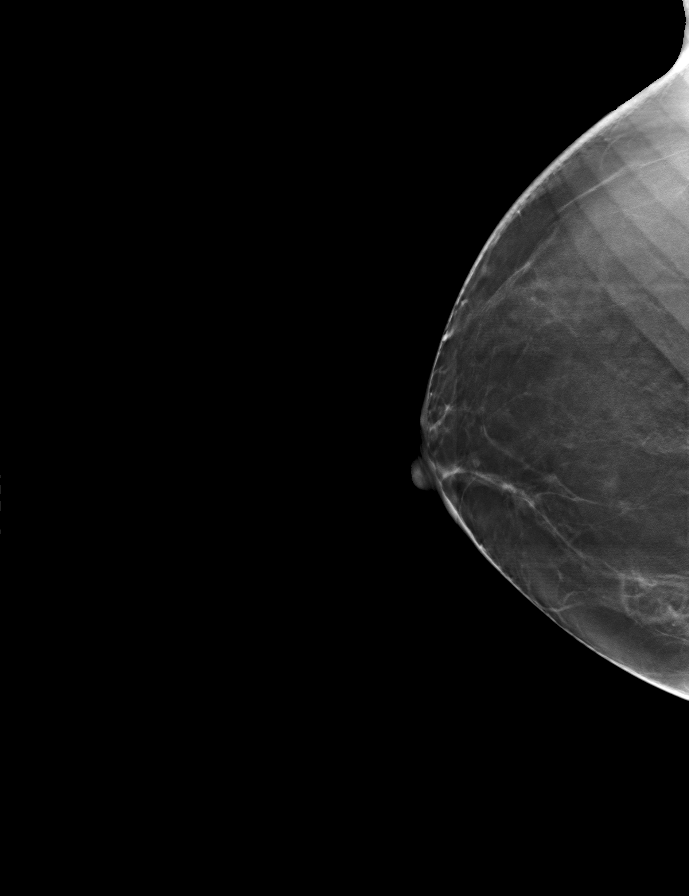
[frame 37/74]
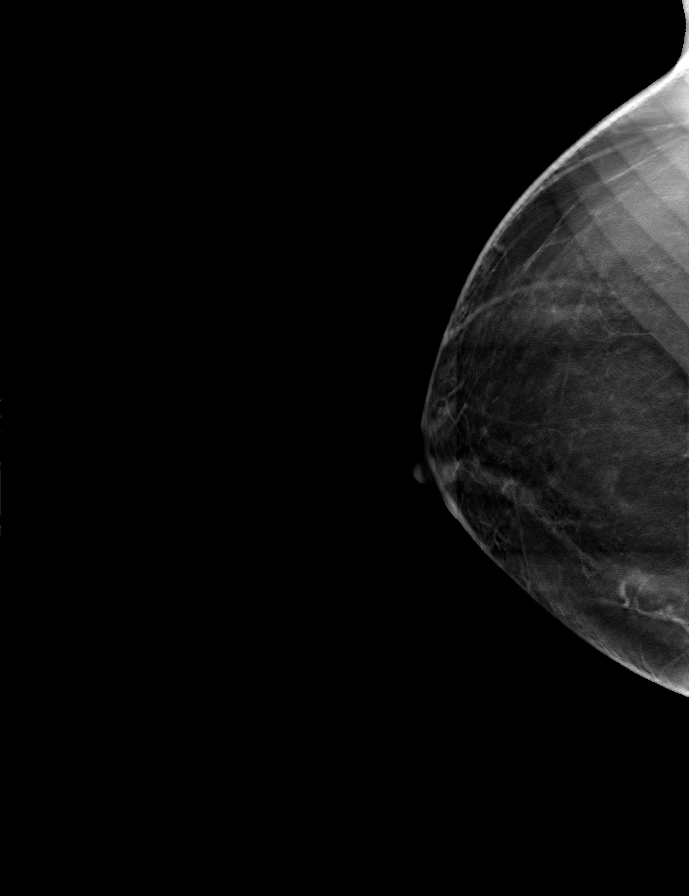

[L CC tomo (2 of 2) · tomo slice 37/74.0]
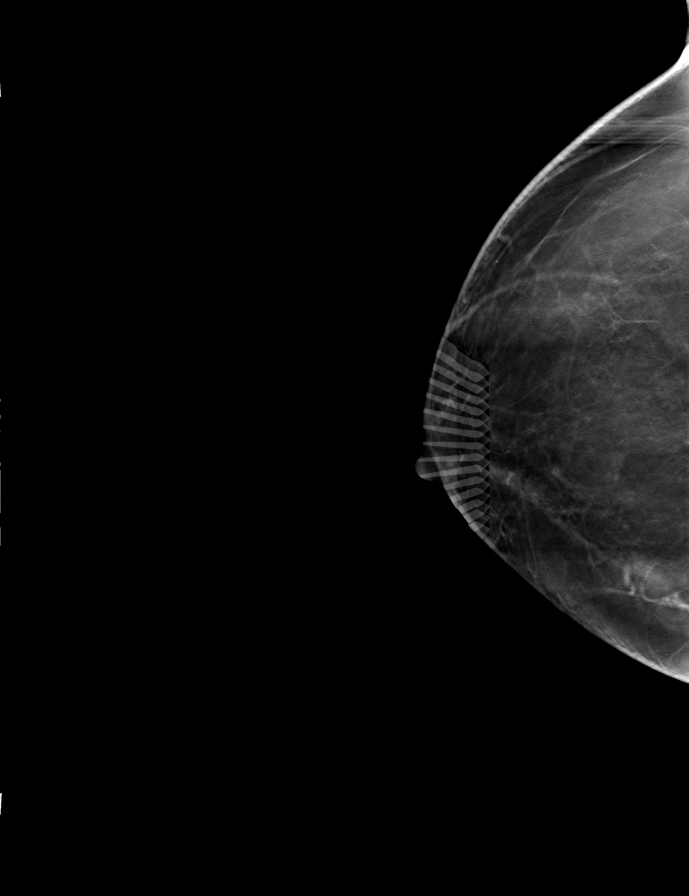

[8 of 15 positions shown; findings below may reference images not displayed]

A stereotactic guided biopsy was performed for the area of architectural distortion located in the left breast at 12 o'clock middle depth.  This was described on the previous mammography report.  The
skin was prepped in the usual manner.  Local anesthetic was administered to the access site.  A skin nick was made in the breast.  The abnormality was approached from the craniocaudal aspect using an
upright digital tomographic mammography unit.  A 9 gauge biopsy needle was placed adjacent to the abnormality under computer guidance and confirmatory stereotactic mammography images were obtained to
document needle placement.  Once the needle was documented to be in the correct location, multiple specimens were obtained using Suros Eviva vacuum needle.  A clip was inserted into the biopsy
cavity.  A skin closure strip and a sterile dressing were applied to the access site.  Post procedure digital mammographic imaging demonstrates the location device at the targeted area.  The
specimens were sent to the laboratory for pathological analysis.
IMPRESSION: Stereotactic guided biopsy of the area of architectural distortion in the left breast at 12 o'clock middle depth was successful with no apparent post procedure complications.  Pathology indicates
malignant ductal carcinoma in situ (DCIS).  Pathology results are concordant with imaging findings.  A surgical/oncologic consultation is recommended.
SUMMARY: The biopsy results were discussed with the patient. The results were discussed with the clinician. Results were faxed to the physician's office.
Arnaud Lajoie M.D.
mc/penrad:09/18/2017 [DATE]
[id number]
Implanted Device GUDID information:
GUDID: [id number], Type: BxMarker, Breast: Left, Location: 12 o'clock

## 2017-11-25 IMAGING — PT PET CT SKULL BASE TO THIGH_RESTAGING
1 of 3 series · 1 of 25 positions shown · non-contrast
Comparison: PET/CT 03/18/2017

Height: 66 inches. Weight: 145.0 pounds.
INDICATION: 66-year-old female with right lung cancer, treated with lobectomy 10/17/2017. No chemotherapy or radiation therapy to date. The patient also had a positive left breast biopsy for DCIS 09/17/2017.
TECHNIQUE: The patient's serum glucose was 152 mg/dL at time of the study.  The patient was intravenously injected with 12.4 mCi F-18 Fluorodeoxyglucose in a left antecubital vein. The patient rested quietly for 63 minutes and then received attenuation corrected PET/CT imaging with Time of Flight Protocol from the skull base to mid thigh. PET, CT, and fused images were reviewed at the reading station. SUV is corrected based on lean body mass. Images are adequate for review.

[Series 4: ct pet 4.0 b30f · axial · 4.0mm · 0.98mm/px · 1 of 278 slices shown]
[im 278/278  brain]
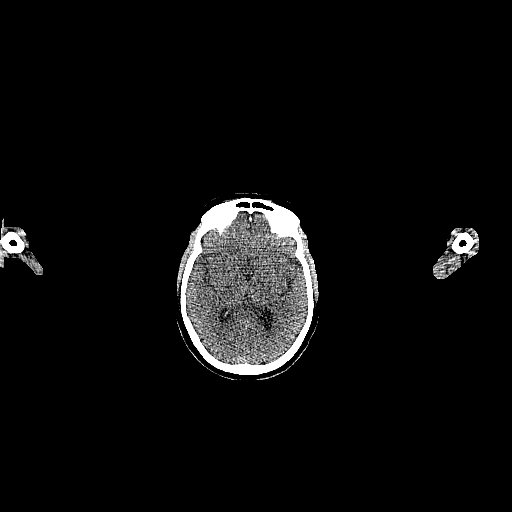

[1 of 25 positions shown; findings below may reference images not displayed]

FINDINGS: BACKGROUND:

Mediastinal max SUV:

Liver mean SUV:

PERCIST Threshold:

HEAD/NECK: 

PET: There is normal distribution of the radiopharmaceutical.

CT:  Carotid calcification.

THORAX: 

PET:  Interval resection of the right upper lobe. Small amount of fluid remains in the partial pneumonectomy bed. A 4 cm pocket of fluid is loculated anteriorly adjacent to the mediastinal pleura within expected rim of mild FDG activity (max SUV 3.06). Miniscule right pneumothorax.

No suspicious metabolic activity in the remaining lung parenchyma. No enlarged or FDG-avid mediastinal or right hilar lymph nodes.

Biopsy clip in the left breast has minimal surrounding activity (max SUV 1.77). Left axillary lymph nodes are non-FDG-avid, not enlarged, and have thin cortices.

Linear activity in the right chest wall and subcutaneous tissues is consistent with recent thoracotomy.

CT: Coronary artery disease. There is very small hiatal hernia with mild circumferential thickening of the distal esophagus. Mild emphysema. No left pleural effusion.

ABDOMEN/PELVIS:

PET:   No concerning metabolic activity in the abdomen or pelvis.

CT: Cholelithiasis. Renal cystic lesions (some of which are dense). Colonic diverticulosis.

SKELETAL:

PET: No suspicious FDG uptake in the skeletal structures.

CT: A few punctate non-FDG-avid dense foci in the pelvic bones are most likely bone islands. Some remodeling about the left iliac bone may be due to remote trauma or intervention. Chronic-appearing T12 and L4 compression deformities.
IMPRESSION: 1. Today's PET/CT is compared to PET/CT 03/18/2017 and 03/05/2017. The patient is in remission from right lung cancer. Postoperative findings in the right hemithorax without evidence of residual tumor. No FDG-avid lymphadenopathy or metastatic lung cancer.

2.  Minimal activity about the left breast biopsy clip is nonspecific. No FDG-avid left axillary lymph nodes or metastatic breast cancer.

PERCIST reporting definitions and therapy response criteria, please click link below.

[URL]

## 2017-12-09 IMAGING — MG BRST PRE-OP NDL LOCALIZATION MAMMO GUIDED LT
8 series · 8 of 16 positions shown · non-contrast
Comparison: Comparison is made to exams dated:  09/17/2017, 09/17/2017, 09/11/2017 [facility] - [HOSPITAL], and 09/01/2017 [facility] - [HOSPITAL].

Images Obtained from [facility]
INDICATION: Left breast needle localazation.

[L LM (1 of 2)]
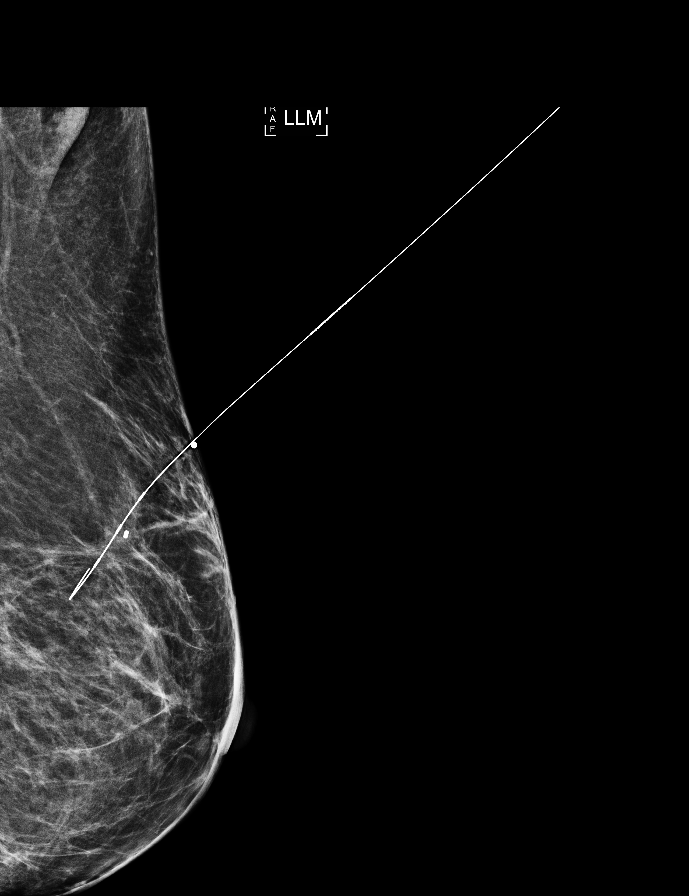

[L LM (2 of 2)]
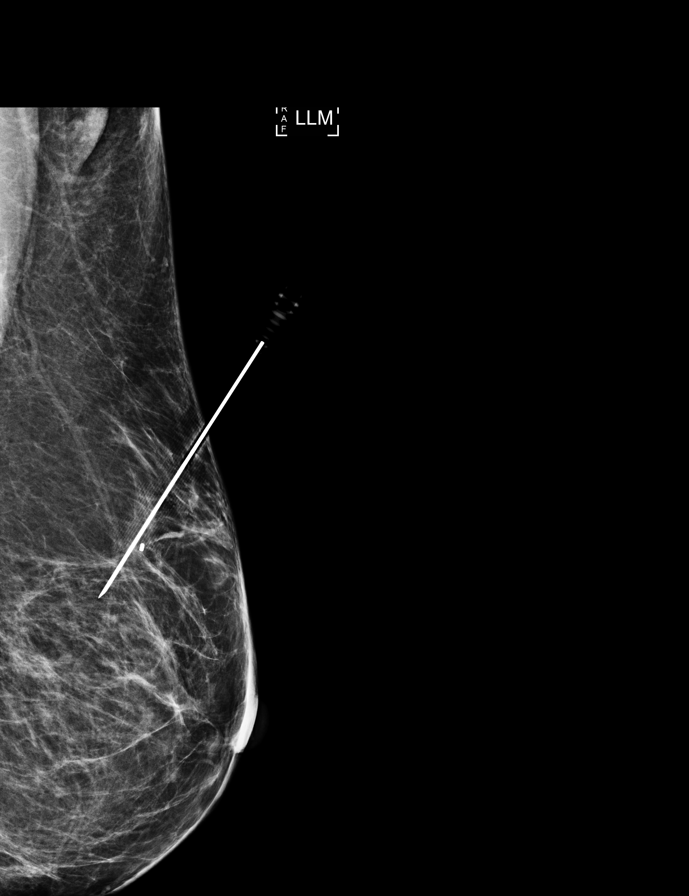

[L CC (1 of 4)]
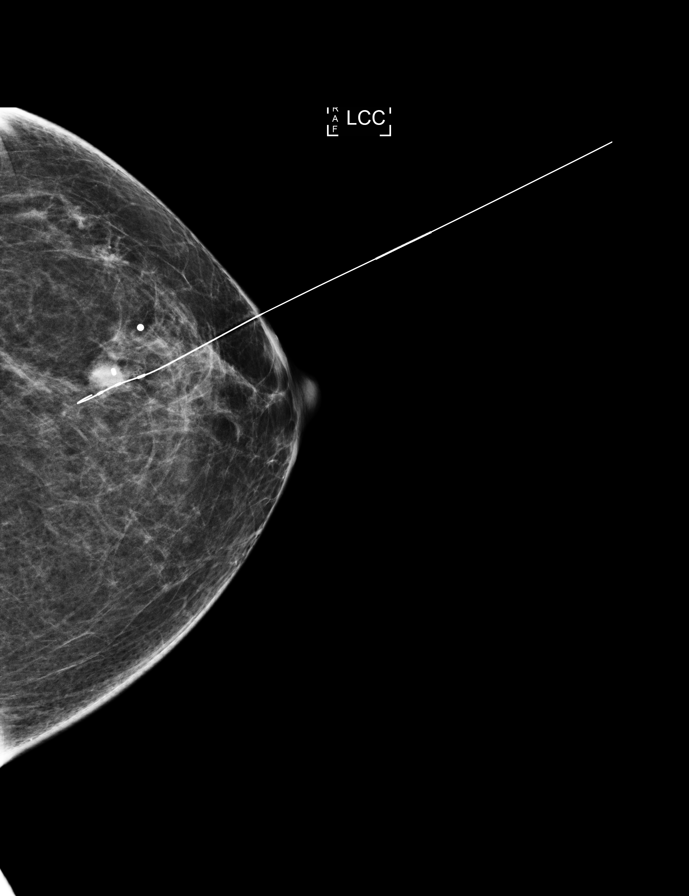

[L CC (2 of 4)]
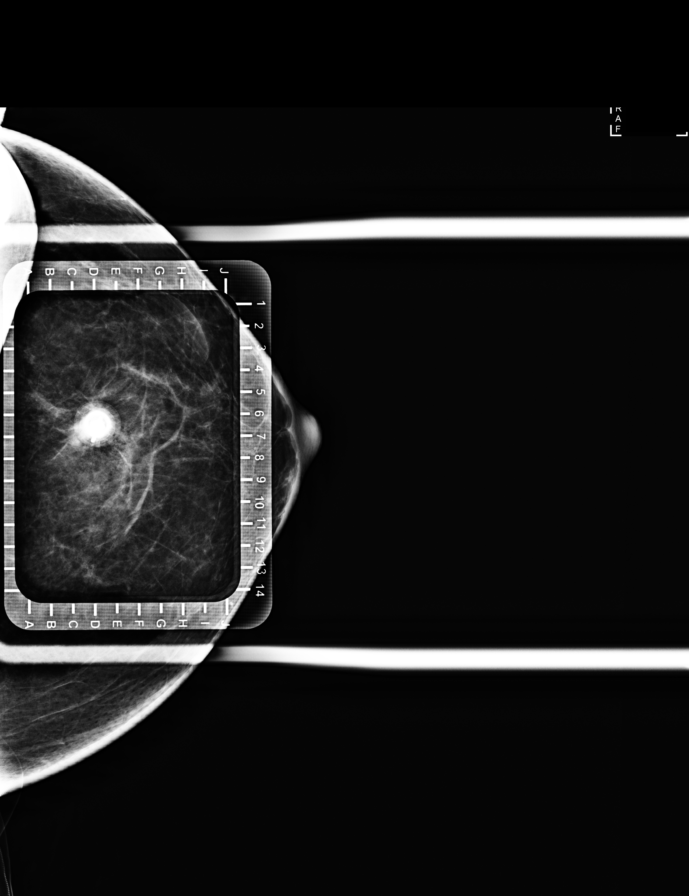

[L CC (3 of 4)]
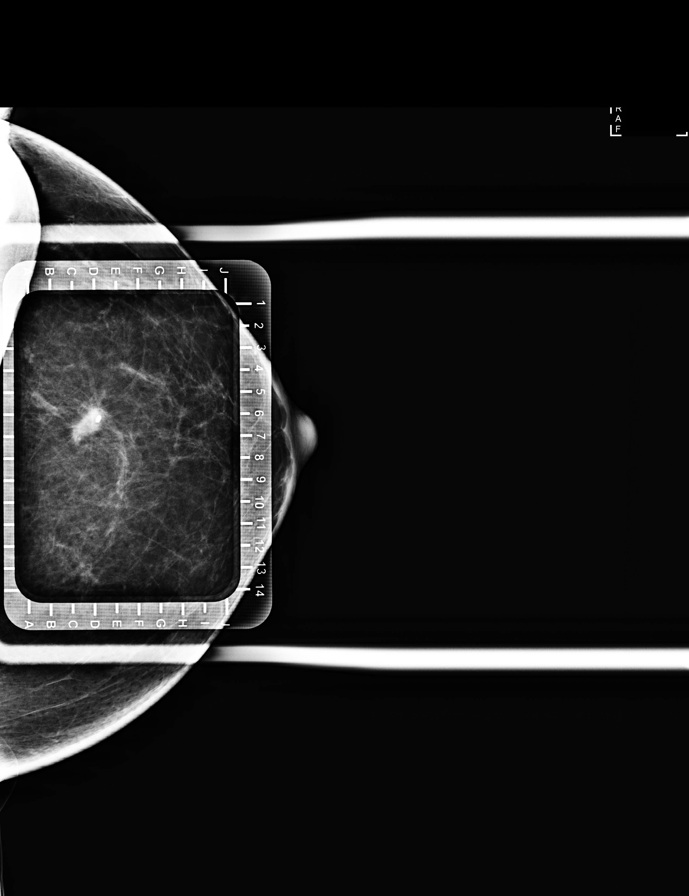

[L CC (4 of 4)]
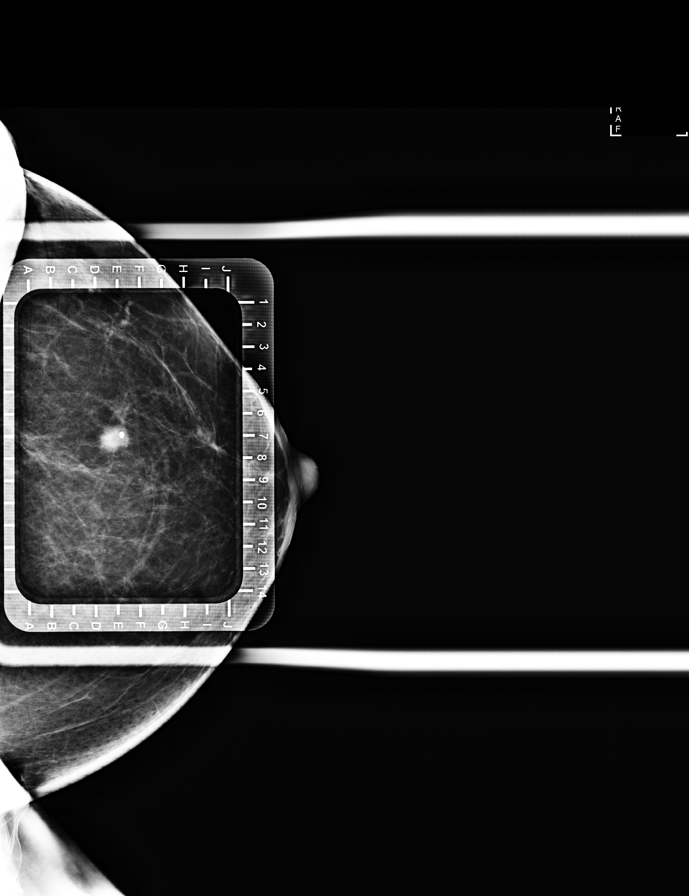

[L CC tomo (1 of 2) · tomo slice 32/63.0]
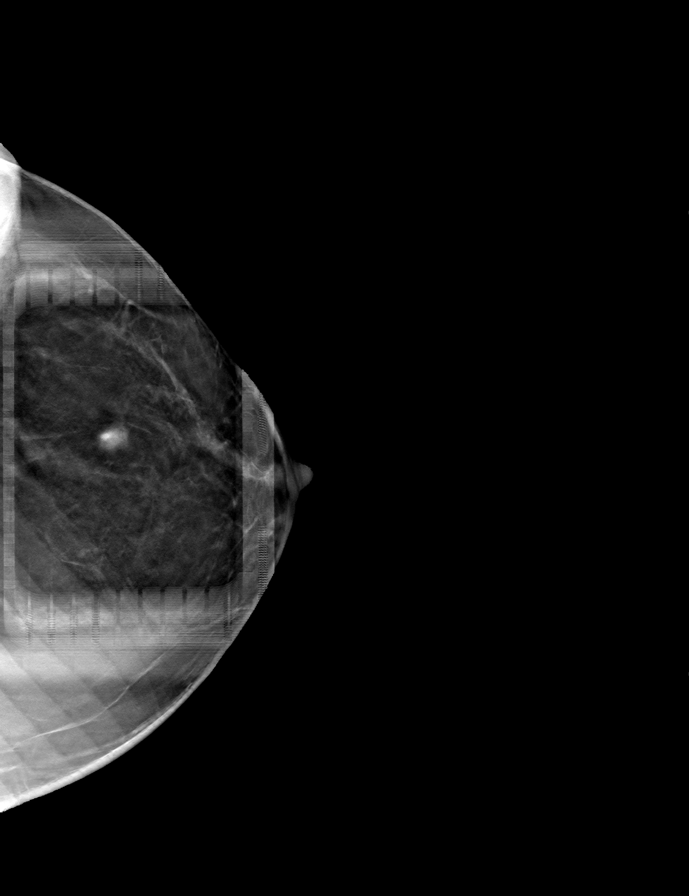

[L CC tomo (2 of 2) · tomo slice 29/58.0]
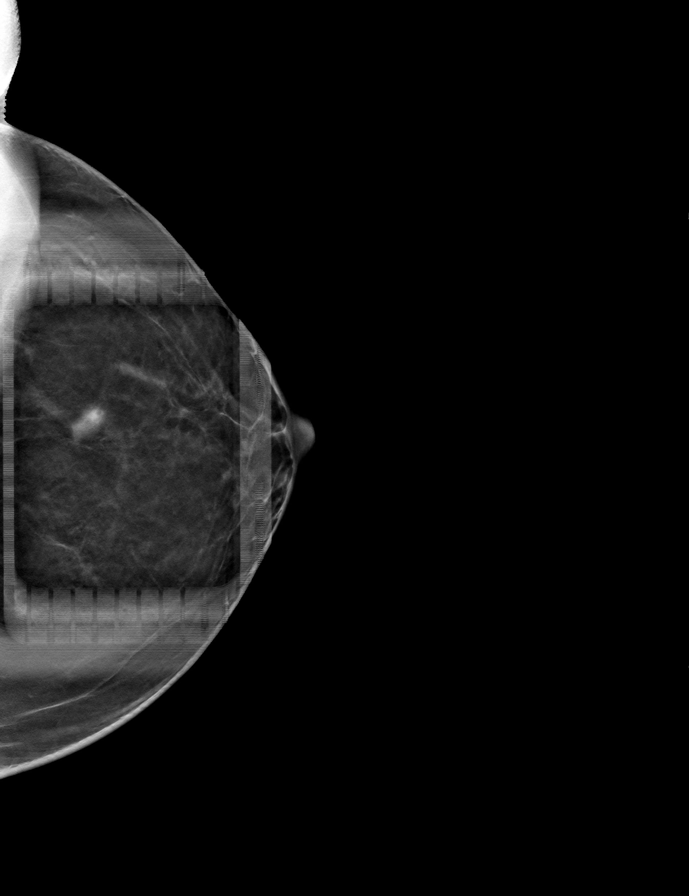

[8 of 16 positions shown; findings below may reference images not displayed]

A wire localization using digital mammography guidance was performed for the marker clip located in the left breast at 12 o'clock middle depth.  This was described on the previous mammography report.
The skin was prepped in the usual manner.  Local anesthetic was administered to the access site.  The localization was approached from the craniocaudal aspect.  A MR Yat was inserted adjacent to
the marker under digital mammography guidance.  A sterile dressing was applied to the access site.  Post placement digital mammographic imaging demonstrates the tip traverses adjacent to the marker.
The post-localization mammogram images were saved to CD and given to the patient. The patient was discharged to the surgical suite in stable condition.
IMPRESSION: Wire localization for the marker clip in the left breast at 12 o'clock middle depth was successful with no apparent post procedure complications.
Is Billz M.D.
mc/penrad:12/18/2017 [DATE]
[id number]
Implanted Device GUDID information:
GUDID: [id number], Type: BxMarker, Breast: Left, Location: 12 o'clock

## 2018-06-19 IMAGING — PT PET CT SKULL BASE TO THIGH_RESTAGING
1 of 3 series · 1 of 25 positions shown · non-contrast
Comparison: [HOSPITAL] PET/CT from 11/25/2017 and 03/18/2017.

Height: 66 inches. Weight: 151.0 pounds.
INDICATION: Right upper lung invasive adenocarcinoma, predominantly solid with focal lepidic change, diagnosed by biopsy on 10/17/2017. Patient was treated with right upper lobectomy. Previously diagnosed left breast DCIS in [DATE], treated with lumpectomy but no additional chemotherapy or radiation therapy. History is notable for tonsillectomy, COPD, and hepatitis C treated in 0311. Patient identifies as a current occasional smoker. FDG PET CT restaging desired.
TECHNIQUE: The patient's serum glucose was 146 mg/dL at time of the study.  The patient was intravenously injected with 10.99 mCi F-18 Fluorodeoxyglucose in a left antecubital fossa vein. The patient rested quietly for 69 minutes and then received attenuation corrected PET/CT imaging with Time of Flight Protocol from the skull base to mid thigh. PET, CT, and fused images were reviewed at the reading station. SUV is corrected based on lean body mass. Images are adequate for review.

[Series 4: ct pet 4.0 b30f · axial · 4.0mm · 0.98mm/px · 1 of 278 slices shown]
[im 278/278  brain]
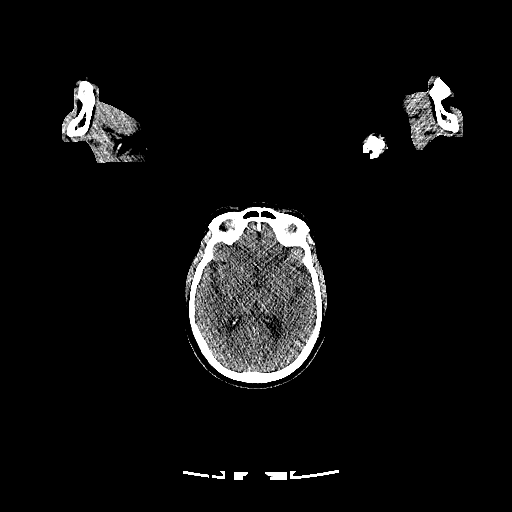

[1 of 25 positions shown; findings below may reference images not displayed]

FINDINGS: HEAD/NECK: 

PET: There is normal distribution of the radiopharmaceutical.

CT:  Metal dental filling artifact is seen within the mouth. Mild cervical spine arthritis and calcification of the bilateral carotid bulbs is similar in appearance.

THORAX: 

PET: The background mediastinal maximum SUV is 1.6. Low-grade FDG activity surrounding fluid collection at the medial upper right thorax has shown resolution of previous fluid with small low-grade FDG activity and a solid nodule abutting the pleural space showing maximum SUV of 1.9 and greatest diameter of 16 mm with a thickness of 8 mm. This appears to be benign postoperative change. No malignant FDG activity is seen within either breast.

CT: Surgical changes from right upper lobectomy are noted.  Surgical clips at the upper right hilum are stable in appearance.

ABDOMEN/PELVIS:

PET:  The background liver mean SUV is 1.5. PERCIST Threshold is 2.5. No malignant FDG activity is seen within the abdomen or pelvis.

CT: Atherosclerotic peripheral vascular disease within the abdominal aorta and its branches is noted without aneurysm. Tiny-low density foci within the gallbladder is suspicious for small cholesterol gallstones. These are unchanged in appearance compared with the prior study. Mild colonic diverticulosis is seen without acute diverticulitis. Calcified phleboliths are seen in the parametrial soft tissues of the lower pelvis. No other abnormalities are identified.

SKELETAL:

PET: Normal FDG activity is demonstrated within the skeleton.

CT: Superior endplate compression fractures are noted at T12 and L3 and suggest underlying osteoporosis in this patient. Arthritic changes of the cervical spine, posterior elements of the lumbar spine, joints of shoulders, and joints of the pelvis appear age-related.
IMPRESSION: 1. Today's PET/CT is compared to [HOSPITAL] PET/CT from 11/25/2017 and 03/18/2017. Patient is felt to be in complete metabolic remission of left breast cancer and right lung cancer following left breast lumpectomy and right upper lobectomy. Benign-appearing scar following resorption of seroma at the medial upper right thorax shows low-grade FDG activity.

2.  Small low-density foci within the gallbladder suspicious for cholesterol gallstones. Mild colonic diverticulosis is seen. Atherosclerotic peripheral vascular disease is noted without aneurysm.

3. Superior endplate compression fractures at T12 and L3, suspicious for underlying osteoporosis. L3 compression fracture is new compared with the prior study. Arthritic changes of the cervical spine and lumbar spine and osteoarthritis of joints of the shoulders and pelvis appear age-related. These are unchanged compared with the prior study.

PERCIST reporting definitions and therapy response criteria, please click link below.

[URL]

## 2018-09-17 IMAGING — MG MAMMO DIAG BIL W/CAD TOMO
6 of 12 series · 6 of 36 positions shown · non-contrast
Comparison: none

HISTORY: Patient is 67 years old and is seen for diagnostic evaluation of personal breast
TECHNIQUE: Bilateral 2-D digital diagnostic mammogram was performed followed by 3-D

[L CC]
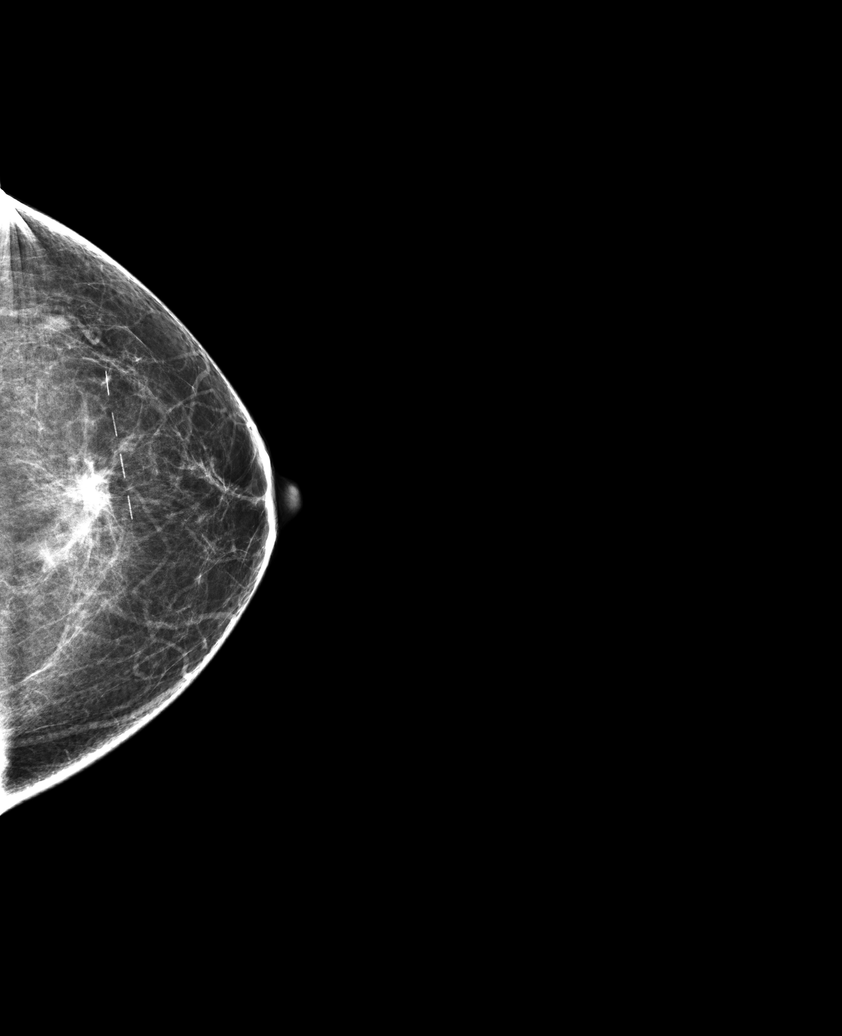

[L XCCL]
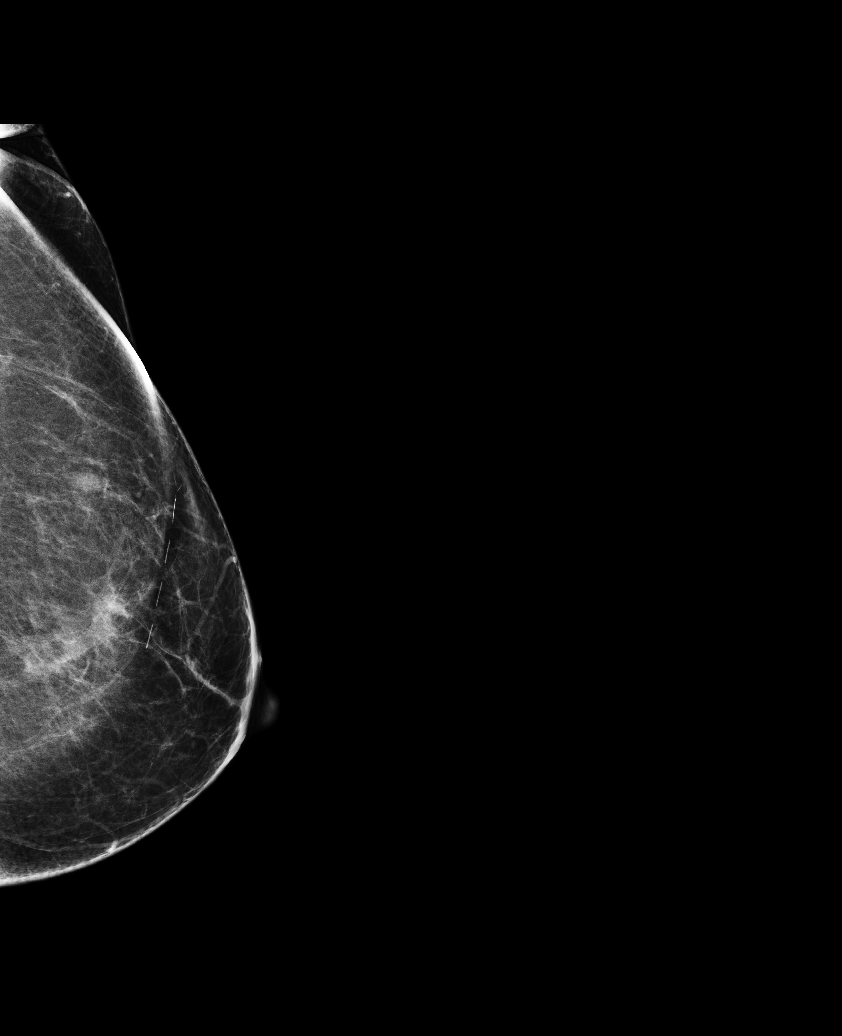

[L MLO]
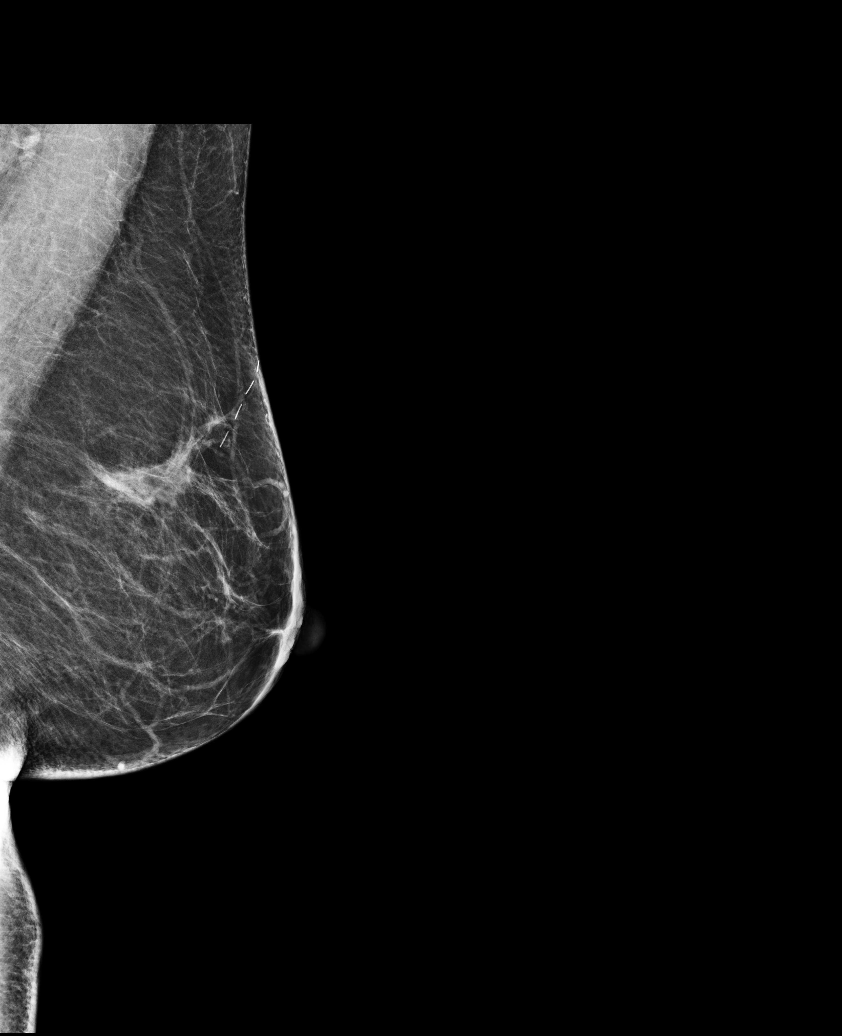

[L ML]
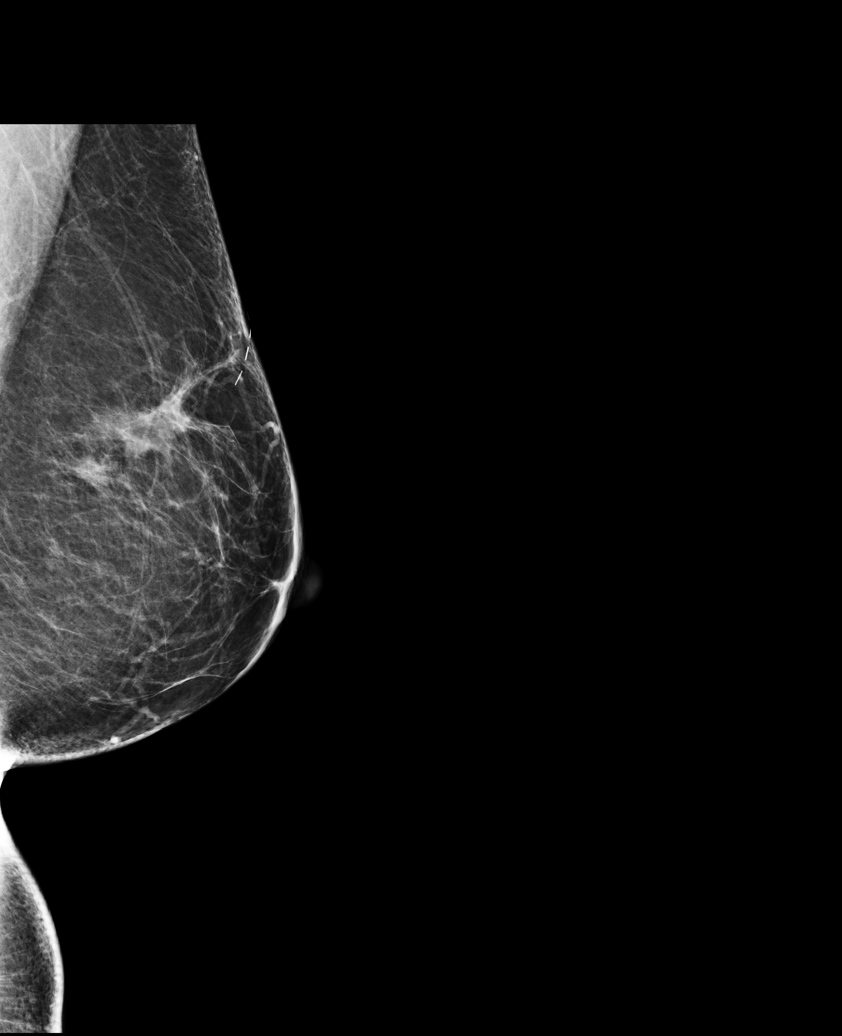

[R MLO]
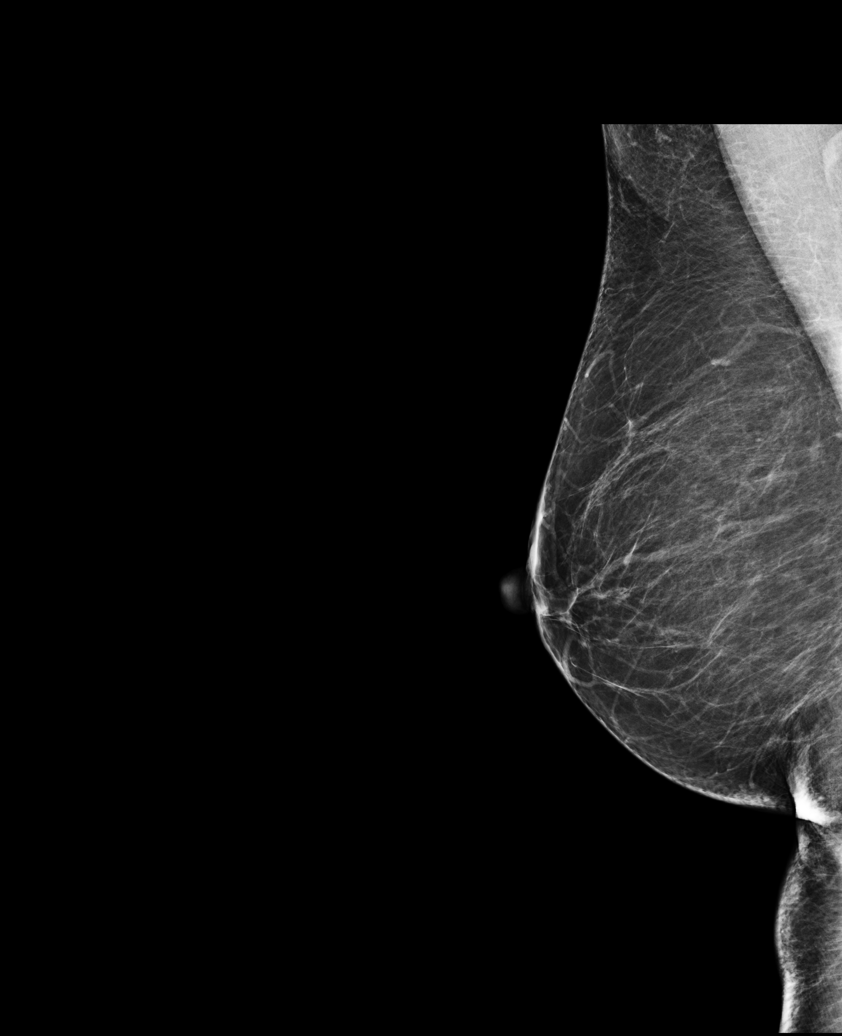

[R CC]
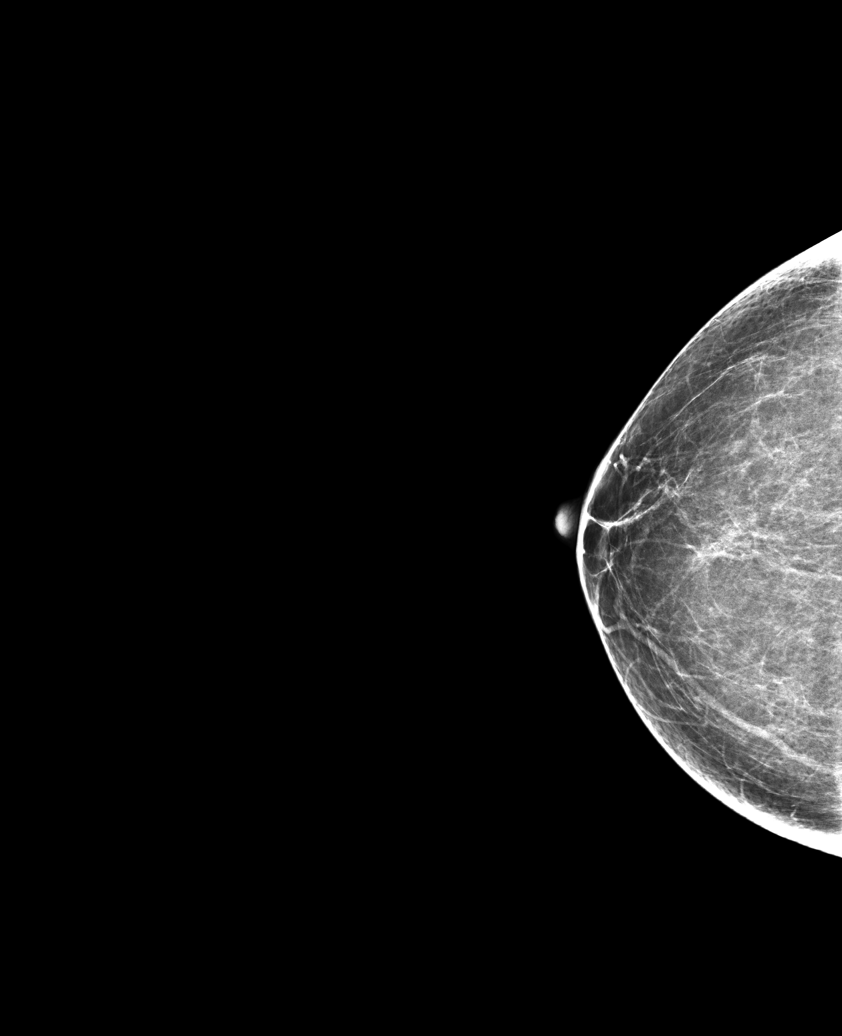

[6 of 36 positions shown; findings below may reference images not displayed]

cancer history in the left breast. Stereotactic core needle biopsy on 09/17/2017 demonstrated DCIS. Lumpectomy on 12/09/2017 showed no residual malignancy. Patient states she had no chemotherapy or radiation. The patient has no family

history of breast cancer.

FILMS COMPARED:

The present examination has been compared to prior imaging studies.
tomosynthesis. Real-time targeted ultrasound of the left breast was performed. 

Current study was also evaluated with a computer aided detection (CAD) system.

MAMMOGRAM FINDINGS:

The breasts are almost entirely fatty. There are benign post surgical changes in the left breast at 12 o'clock.

Finding 1:  There is a new oval 11 mm mass with indistinct margins in the left breast at 2 o'clock, posterior depth.

Finding 2:  There is a new 4 mm asymmetry seen in the CC view only in the lateral

left breast at middle depth. This is obscured on the lateral projection by overlapping scar tissue.

In the right breast, no suspicious masses, calcifications or other abnormalities

are seen.

ULTRASOUND FINDINGS:

Left breast ultrasound demonstrates benign postsurgical changes in the left breast at 12 o'clock. Otherwise, no suspicious masses, shadowing, or other sonographic abnormality identified. Specifically, there is no sonographic correlate to the new 11 mm mass seen in the left breast at 2 o'clock or for the 4 mm asymmetry seen in the lateral aspect of the left breast.
IMPRESSION: Finding 1:  New 11 mm mass in the left breast at 2 o'clock is suspicious. A stereotactically guided

biopsy is recommended.

Finding 2:  New asymmetry in the lateral left breast at middle depth is mildly

suspicious. A stereotactically guided biopsy is recommended. Patient is aware that the biopsy may be canceled if the lesion cannot be visualized on the stereotactic machine.

Findings and recommendations discussed with the patient. She plans to schedule the biopsy. The patient received a copy of the results at the end of the examination.

BI-RADS Category 4: Suspicious

## 2018-09-17 IMAGING — US US BREAST LT LTD
1 series · 13 of 17 positions shown · non-contrast
Comparison: none

HISTORY: Patient is 67 years old and is seen for diagnostic evaluation of personal breast
TECHNIQUE: Bilateral 2-D digital diagnostic mammogram was performed followed by 3-D

[Series 1: us breast left ltd · 13 of 17 slices shown]
[im 1/17]
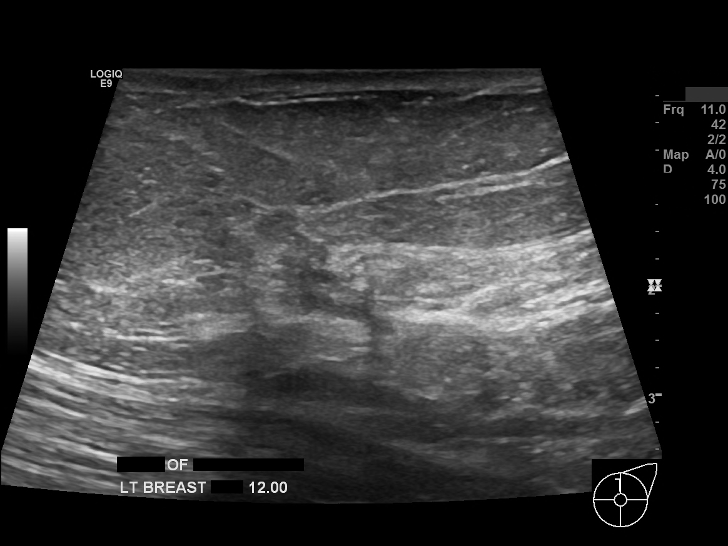
[im 2/17]
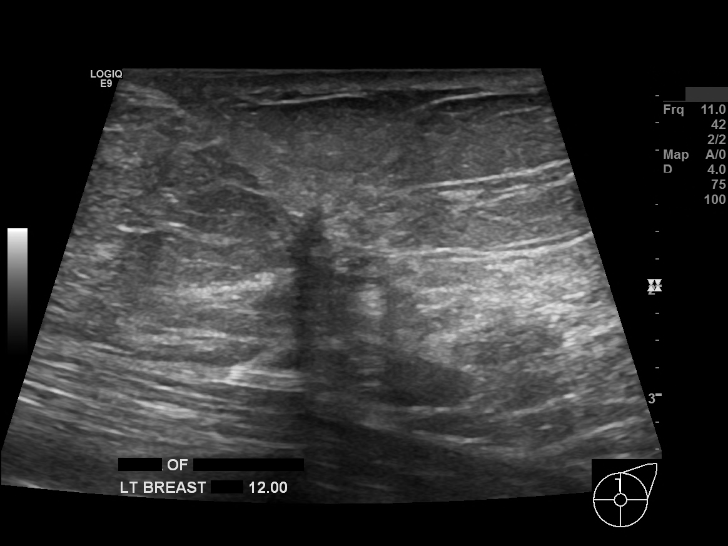
[im 4/17]
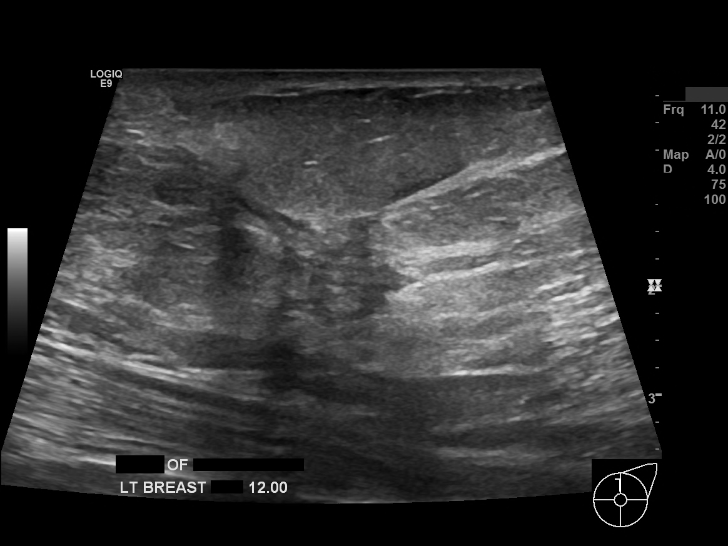
[im 5/17]
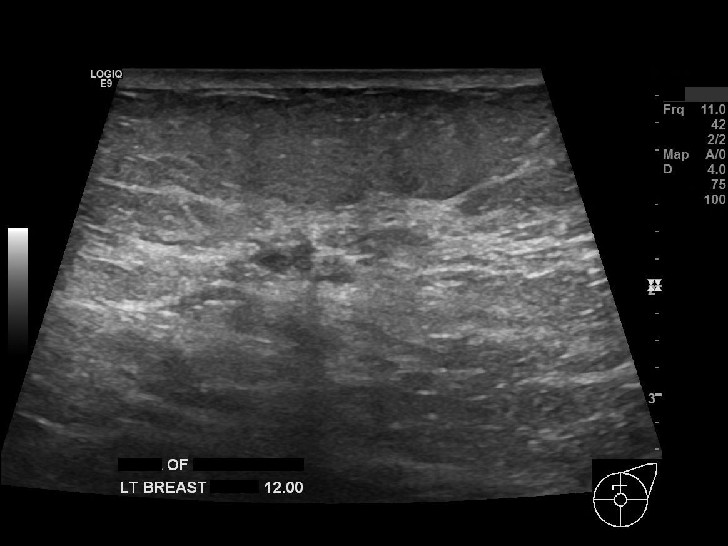
[im 6/17]
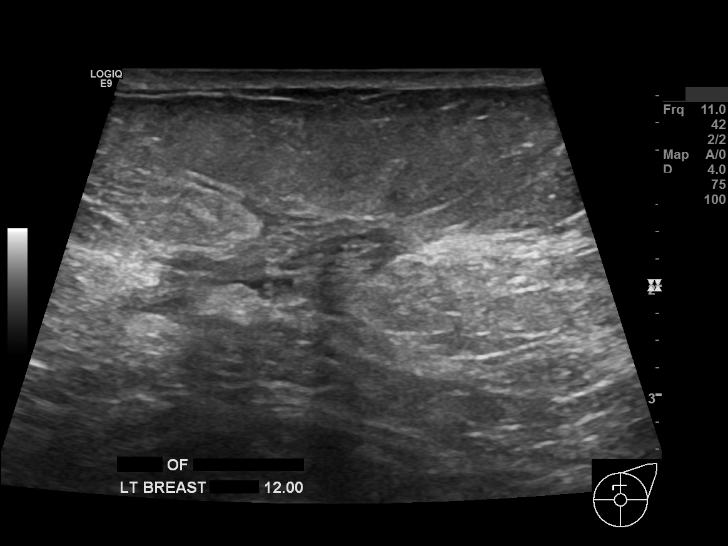
[im 8/17]
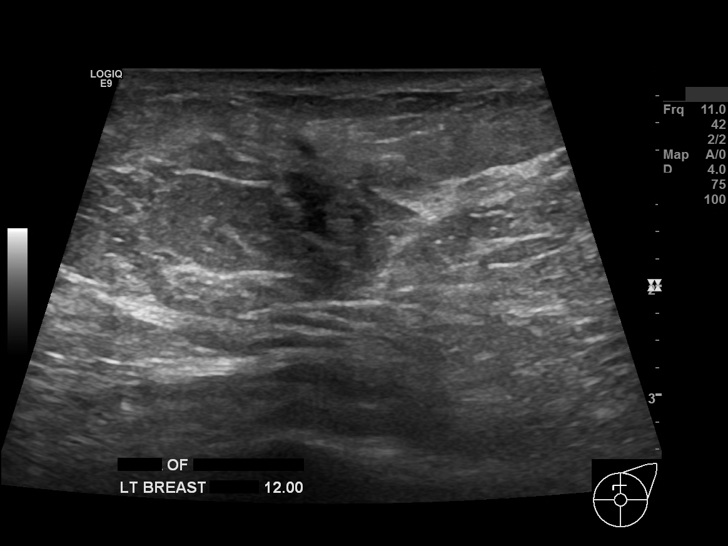
[im 9/17]
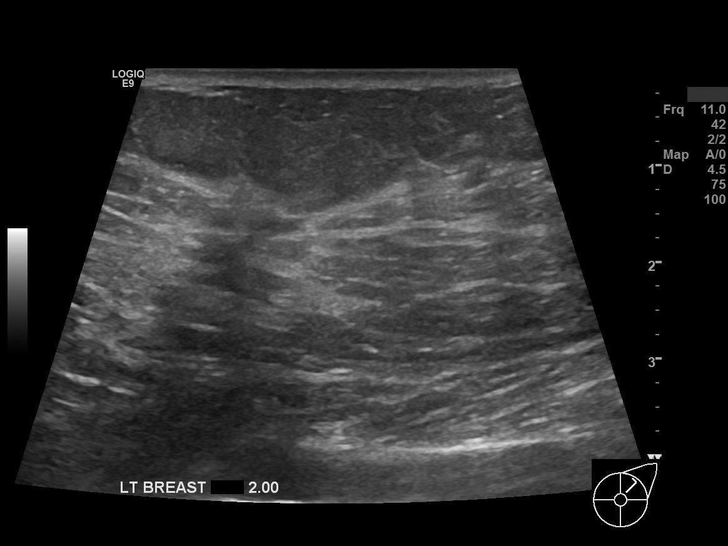
[im 10/17]
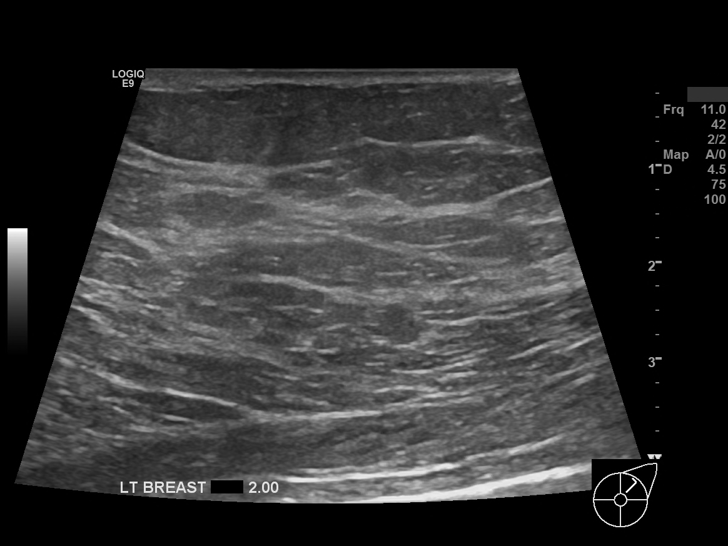
[im 12/17]
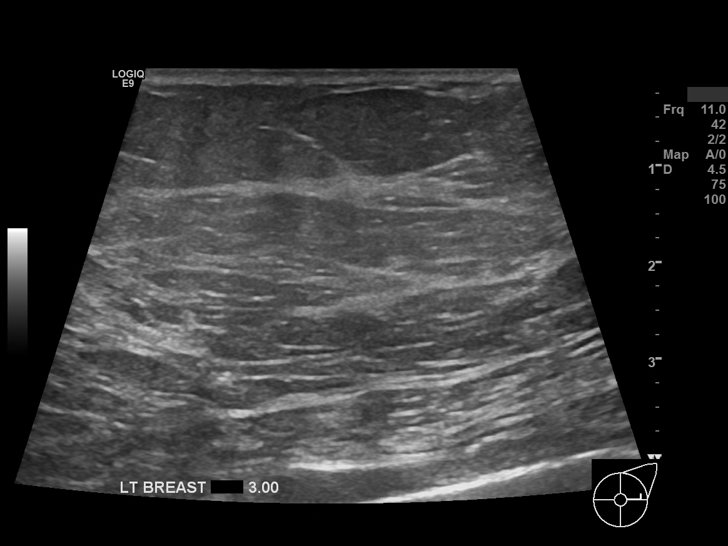
[im 13/17]
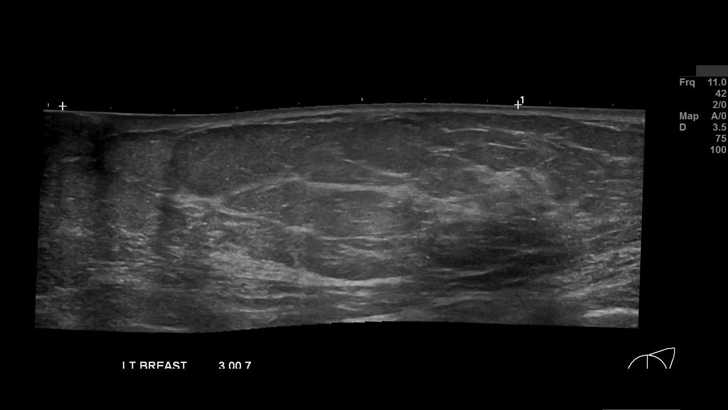
[im 14/17]
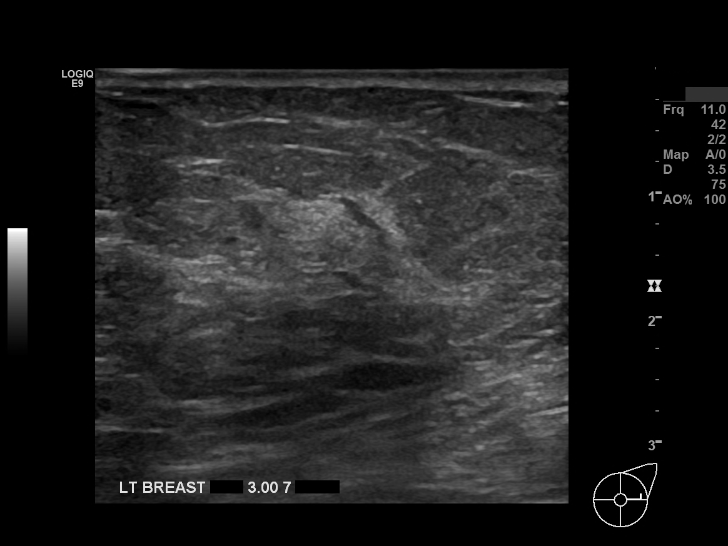
[im 16/17]
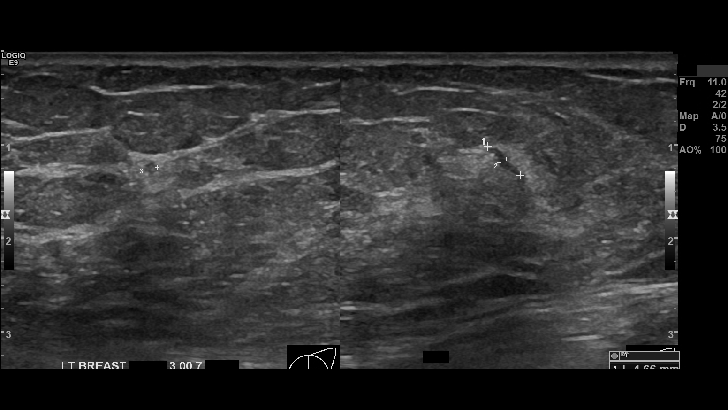
[im 17/17]
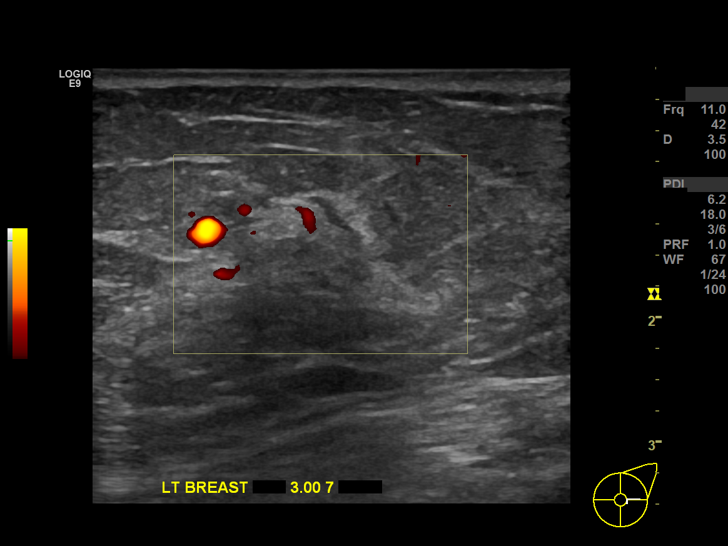

[13 of 17 positions shown; findings below may reference images not displayed]

cancer history in the left breast. Stereotactic core needle biopsy on 09/17/2017 demonstrated DCIS. Lumpectomy on 12/09/2017 showed no residual malignancy. Patient states she had no chemotherapy or radiation. The patient has no family

history of breast cancer.

FILMS COMPARED:

The present examination has been compared to prior imaging studies.
tomosynthesis. Real-time targeted ultrasound of the left breast was performed. 

Current study was also evaluated with a computer aided detection (CAD) system.

MAMMOGRAM FINDINGS:

The breasts are almost entirely fatty. There are benign post surgical changes in the left breast at 12 o'clock.

Finding 1:  There is a new oval 11 mm mass with indistinct margins in the left breast at 2 o'clock, posterior depth.

Finding 2:  There is a new 4 mm asymmetry seen in the CC view only in the lateral

left breast at middle depth. This is obscured on the lateral projection by overlapping scar tissue.

In the right breast, no suspicious masses, calcifications or other abnormalities

are seen.

ULTRASOUND FINDINGS:

Left breast ultrasound demonstrates benign postsurgical changes in the left breast at 12 o'clock. Otherwise, no suspicious masses, shadowing, or other sonographic abnormality identified. Specifically, there is no sonographic correlate to the new 11 mm mass seen in the left breast at 2 o'clock or for the 4 mm asymmetry seen in the lateral aspect of the left breast.
IMPRESSION: Finding 1:  New 11 mm mass in the left breast at 2 o'clock is suspicious. A stereotactically guided

biopsy is recommended.

Finding 2:  New asymmetry in the lateral left breast at middle depth is mildly

suspicious. A stereotactically guided biopsy is recommended. Patient is aware that the biopsy may be canceled if the lesion cannot be visualized on the stereotactic machine.

Findings and recommendations discussed with the patient. She plans to schedule the biopsy. The patient received a copy of the results at the end of the examination.

BI-RADS Category 4: Suspicious

## 2018-09-17 IMAGING — OT DXA BONE DENSITY
2 series · 2 of 2 positions shown · non-contrast
Comparison: none

REASON FOR EXAM: Postmenopausal screening.

[Series 1: — · 1 of 1 slices shown (1 of 2)]
[im 1/1]
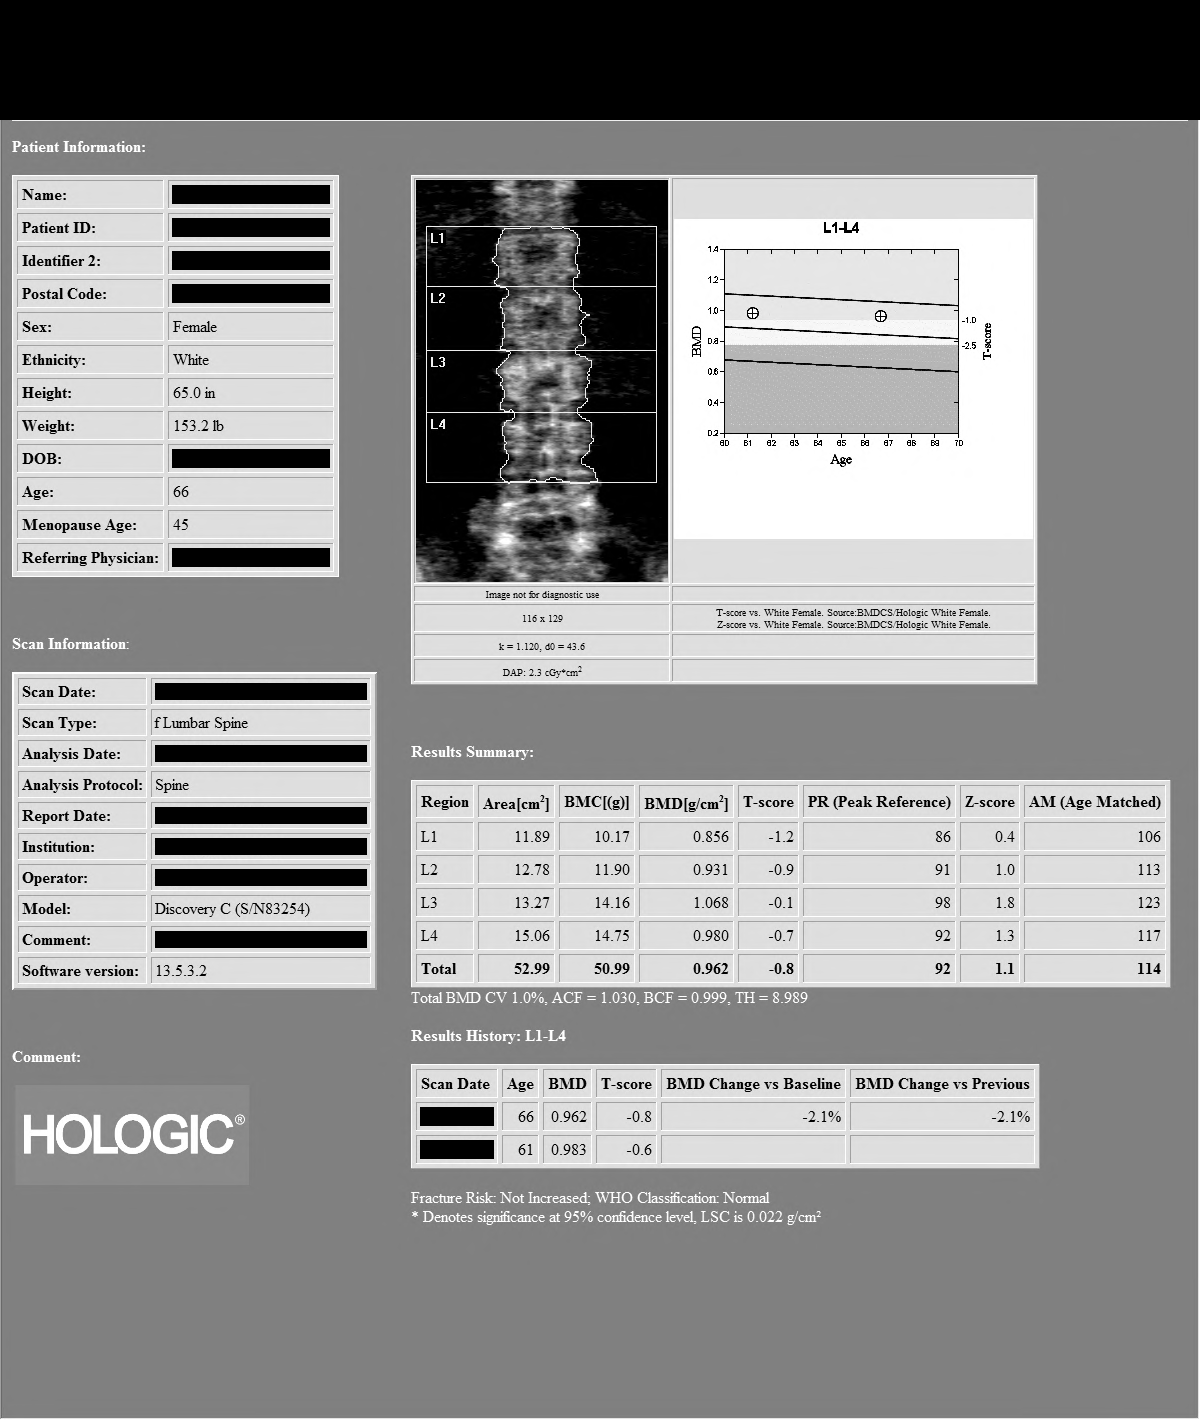

[Series 2: — · left · 1 of 1 slices shown (2 of 2)]
[im 1/1]
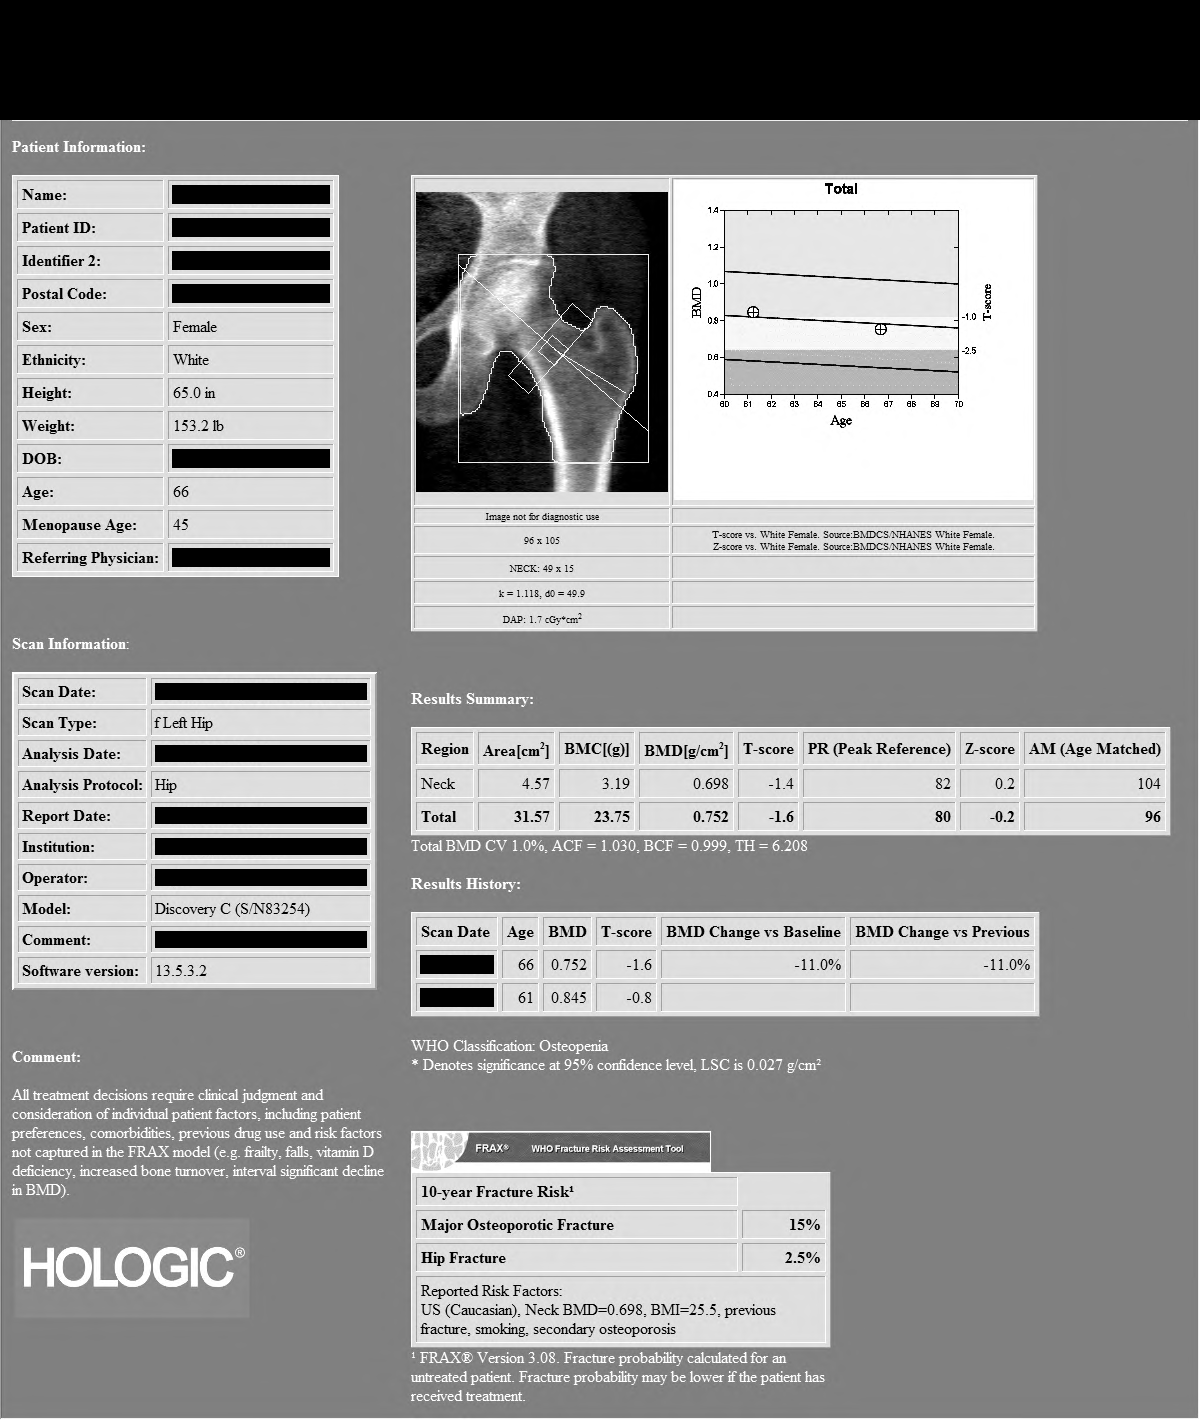

[2 of 2 positions shown; findings below may reference images not displayed]

IMPRESSION: As defined by World Health Organization, the patient meets the criteria for OSTEOPENIA based on hip T-score.

PATIENT DEMOGRAPHICS:  67-year-old white female.
RISK FACTORS:  Adult nontraumatic fractures. Patient smokes. Secondary risk factor of chronic liver disease and diabetes.

PRIOR EXAMS:  DXA from [HOSPITAL] dated 04/01/2013.

METHOD:  Scans of the spine and hip were performed using dual energy X-ray densitometry (DXA) with the [employer] C (S/IQOHXF) system.
FINDINGS: 1.    Review of scanogram images shows no factor invalidating scan results.  

2.    The lumbar spine exam using L1-L4 regions shows average Bone Mineral Density is 0.962 gm/cm2 of Hydroxyapatite.  The T-score (comparing patient with a young adult group) is 0.8 standard deviations BELOW mean. The Z-score (comparing patient with an age-matched group) is 1.1 standard deviations ABOVE mean.

COMPARED TO PRIOR DXA, spine bone density was 0.983 gm/cm2.  This is an interval decrease of 0.021 gm/cm2 or -2.1 %. Technologist least significant change in the spine is 0.030 gm/cm2. This is not a statistically significant interval decrease.

3.  The left hip exam using total region of interest shows average Bone Mineral Density is 0.752 gm/cm2 of Hydroxyapatite. The T-score (comparing patient with a young adult group) is 1.6 standard deviations BELOW mean. The Z-score (comparing patient with an age-matched group) is 0.2 standard deviations BELOW mean.

COMPARED TO PRIOR DXA, hip bone density was 0.845 gm/cm2.  This is an interval decrease of 0.093 gm/cm2 or -11 %. Technologist least significant change in the hip is 0.032 gm/cm2. This is a statistically significant interval decrease.

According to the World Health Organization risk assessment tool (FRAX) for osteopenia only, which uses the femoral neck T score and includes other patient risk factors for fracture, the patient has a 10-year absolute risk of hip fracture of 2.5% and 10-year absolute risk fracture for any major fracture of 15% .

RECOMMENDATIONS: In addition to assuring that the patient is taking supplemental calcium and vitamin D which the patient has indicated she IS taking, regular exercise to patient tolerance is recommended. The patient IS NOT taking prescribed medication for prevention of bone loss.

According to criteria established by The National Osteoporosis Foundation, the patient DOES NOT meet the current indications for prescribed medical therapy.  

The National Osteoporosis Foundation now recommends followup DXA scanning every two years in patients at risk regardless of whether the patient is undergoing pharmacological treatment.

World Health Organization criteria for diagnosis, please see link below.

[URL]

## 2018-10-06 IMAGING — MG MAMMO DIAG LT W/TOMO
4 series · 4 of 12 positions shown · non-contrast
Comparison: none

INDICATION: 67 years-old Female referred for stereotactic core needle biopsy of left breast masses.

[L MLO]
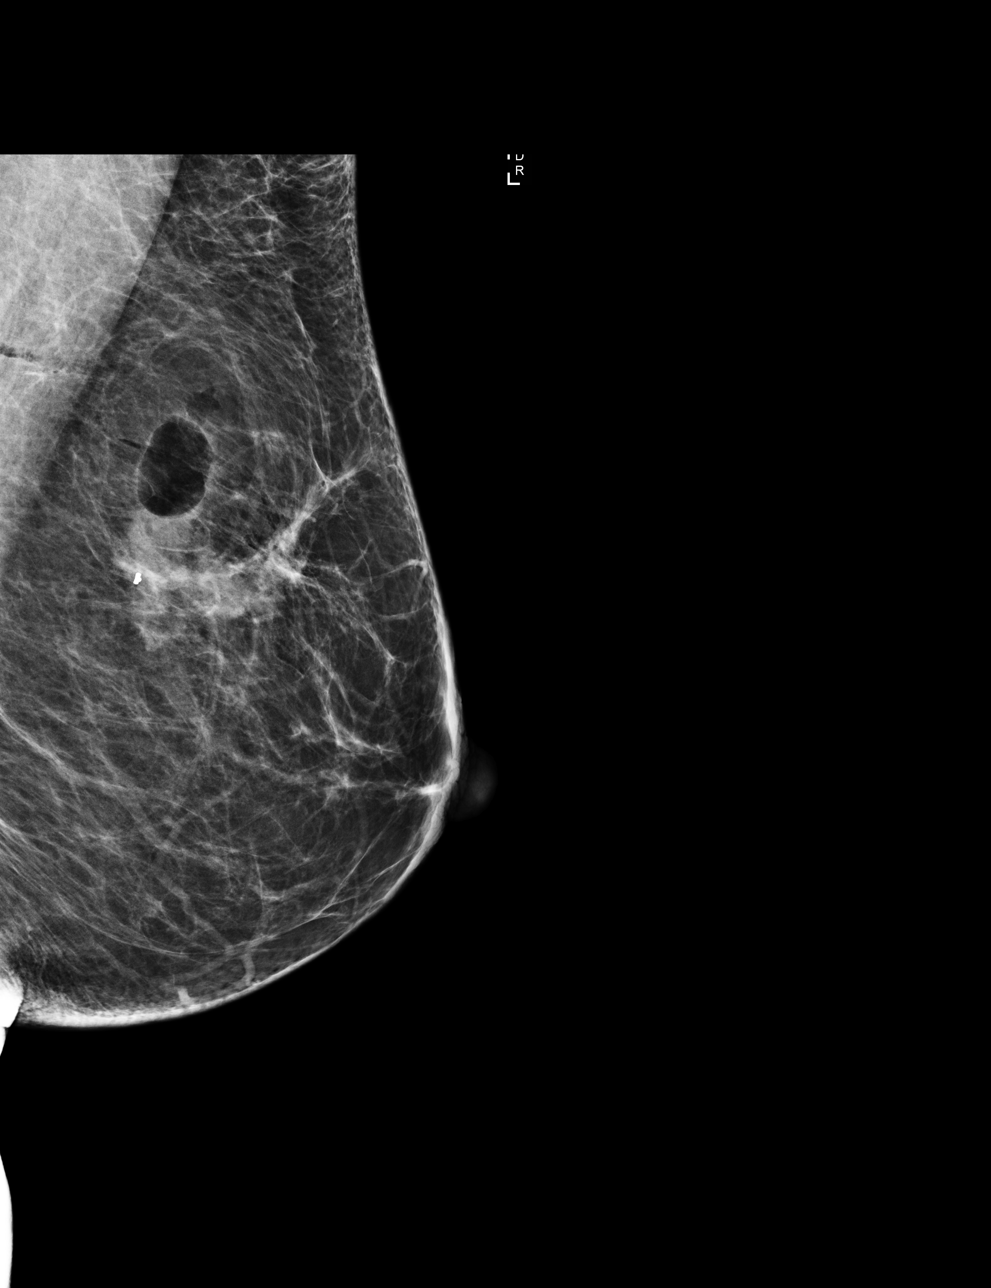

[L CC]
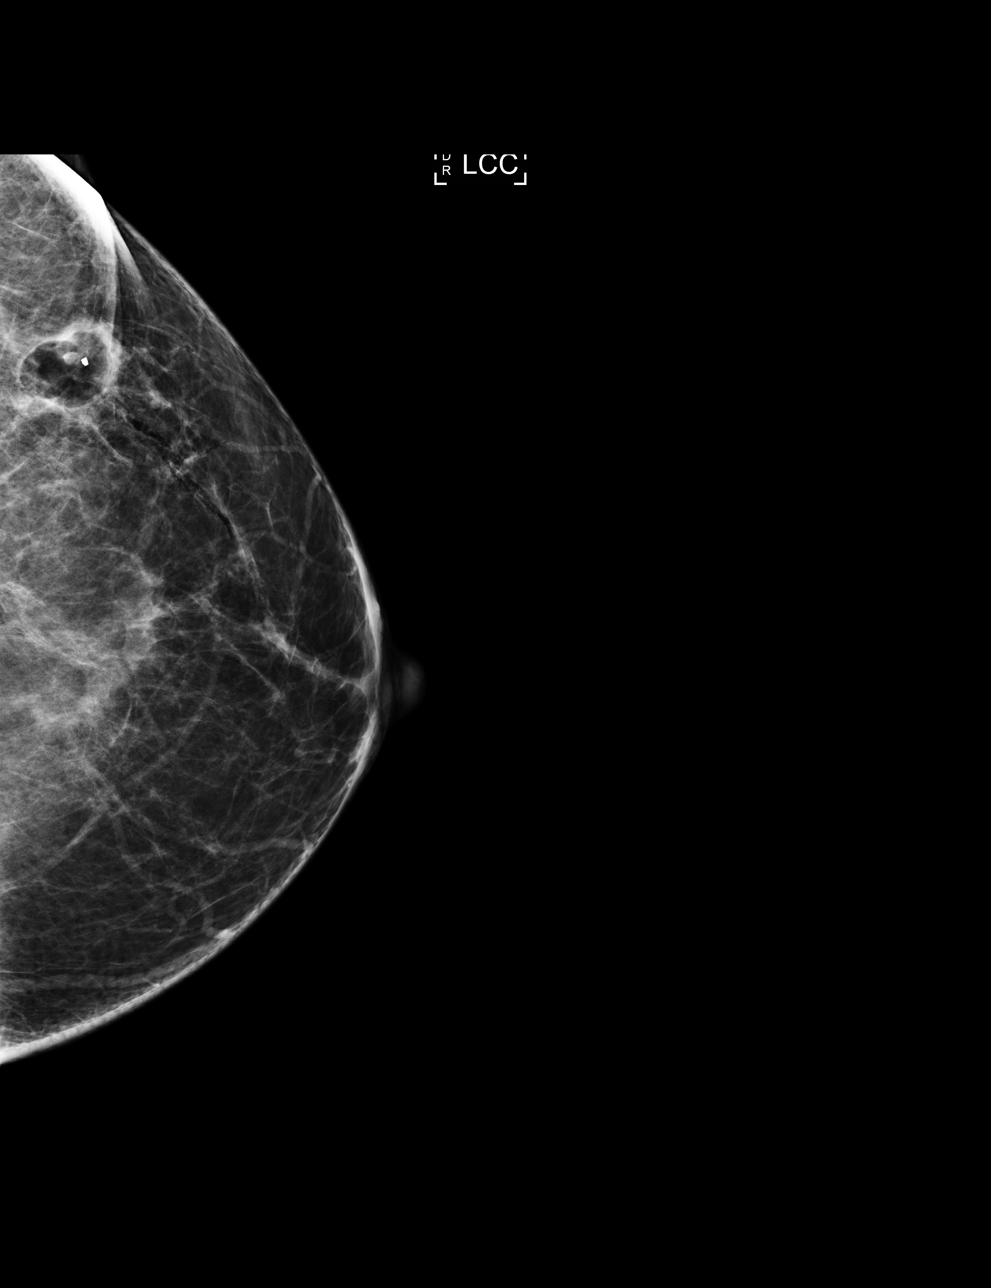

[L CC tomo · tomo slice 44/87.0]
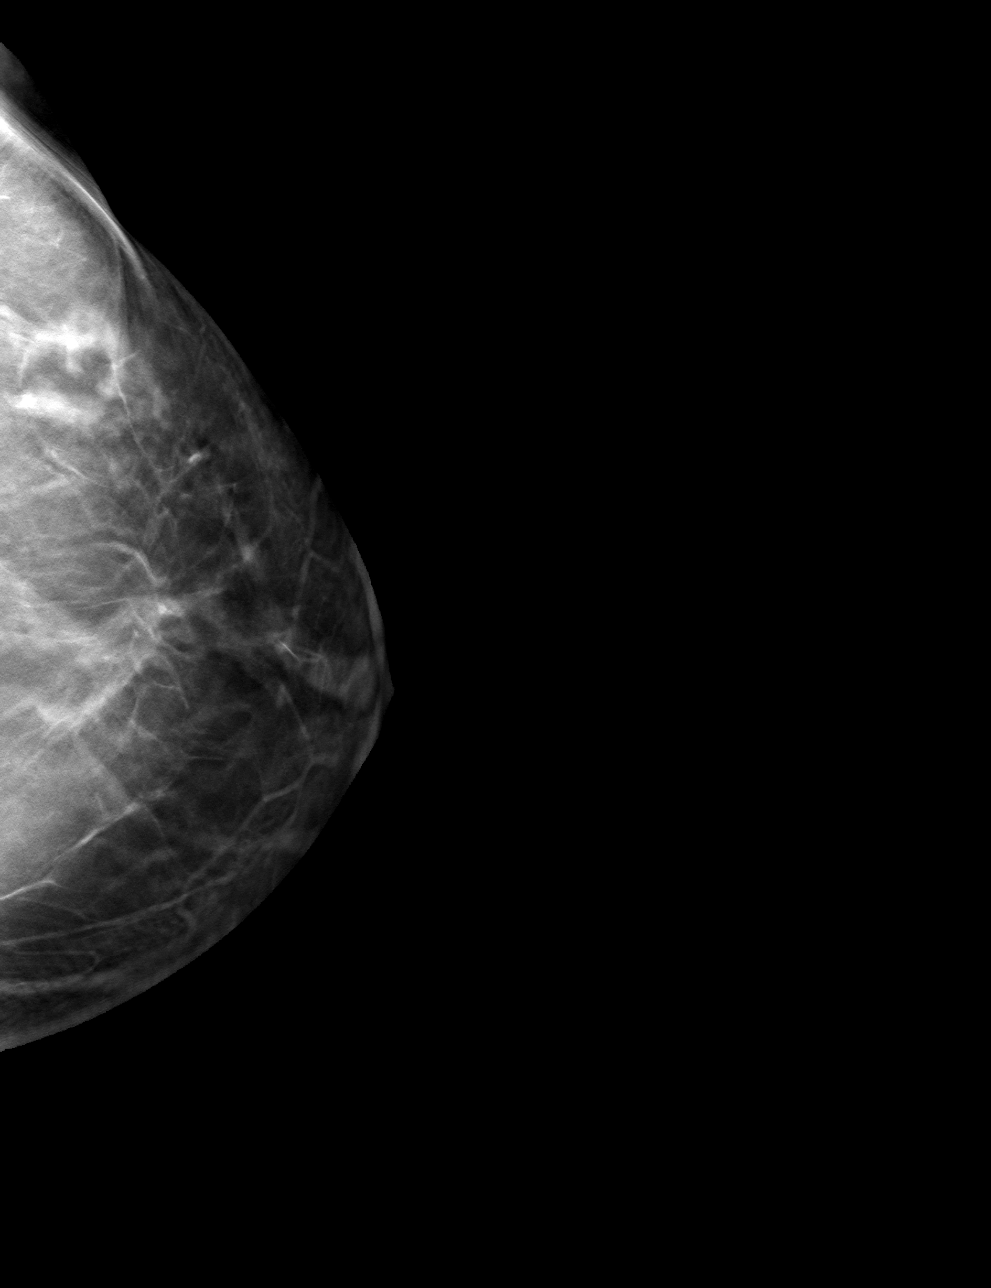

[L MLO tomo · tomo slice 39/77.0]
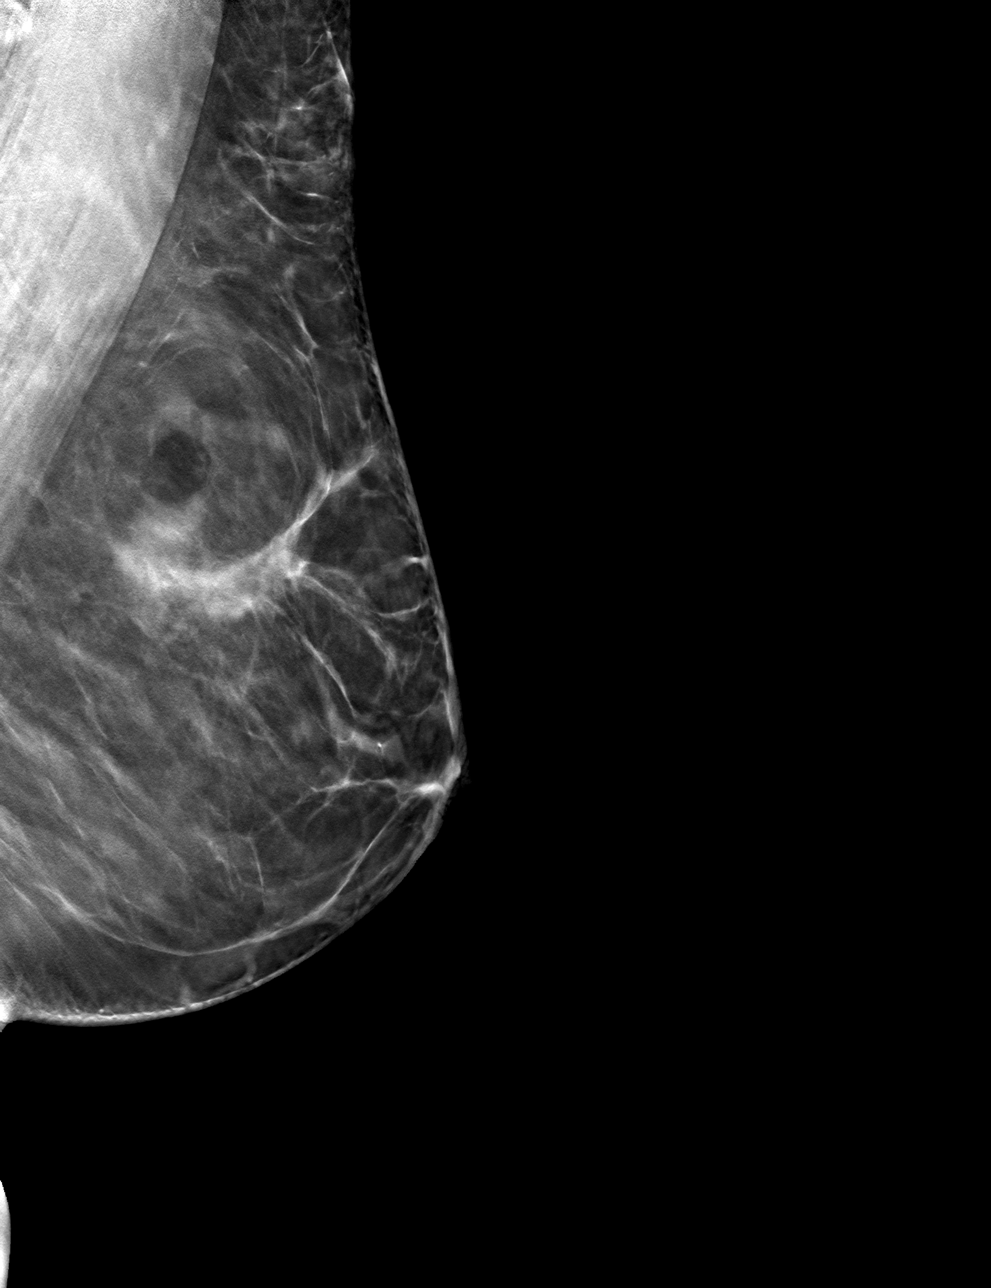

[4 of 12 positions shown; findings below may reference images not displayed]

PROCEDURE:

Patient consent: Prior to the procedure risks, complications, alternatives and a description of the procedure were discussed. All questions were answered.  Informed consent was obtained and signed.  

Procedure description:

A stereotactic guided biopsy was performed for the 9 mm oval indistinct mass in the left breast at 2 o'clock, posterior depth.  This was described in the prior mammogram report. The skin was prepped in the usual manner. The local area was anesthetized with 1% lidocaine.  A skin nick was made in the breast.  The abnormality was approached superiorly using an upright digital mammography unit.  A 9 gauge Suros-Eviva vacuum-assisted biopsy needle was placed adjacent to the abnormality under computer guidance and confirmatory stereotactic mammography images were obtained to document needle placement.  Once the needle was documented to be in the correct location, multiple specimens were obtained.     A clip was inserted into the biopsy cavity. Hemostasis was achieved with manual compression. Sterile skin strips were applied to the access site.  

The patient was scheduled for 2 stereotactic biopsies today for a left breast mass at 2 o'clock and for an asymmetry in the left lateral breast. On today's imaging, the asymmetry in the left lateral breast did not persist, suggesting overlapping benign fibroglandular tissue. Therefore, the second biopsy of the left lateral asymmetry was canceled.

Post procedure digital mammographic imaging demonstrates the clip at the appropriate biopsy site location.    The specimens were sent to the laboratory for pathological analysis. The patient was given appropriate post care instructions and discharged in good condition.
IMPRESSION: MALIGNANT

1. Stereotactic guided biopsy of the 9 mm oval indistinct mass in the left breast at 2 o'clock was successful with no apparent post procedure complications.  

2. Pathology indicates invasive mammary carcinoma. Pathology results are concordant with imaging findings.  Recommend surgical and oncologic management. An MRI is also strongly recommended given possible additional mammographic asymmetry in the left 2 o'clock breast that was unable to be biopsied today due to inconspicuity.

3. A voicemail was left with the referring oncologist nurse. The biopsy results and follow-up recommendations were discussed with the patient. The patient reports no complications on postbiopsy day 1.

## 2018-10-06 IMAGING — MG Tomo Biopsy, LLM
7 series · 8 of 15 positions shown · non-contrast
Comparison: none

INDICATION: 67 years-old Female referred for stereotactic core needle biopsy of left breast masses.

[L LM]
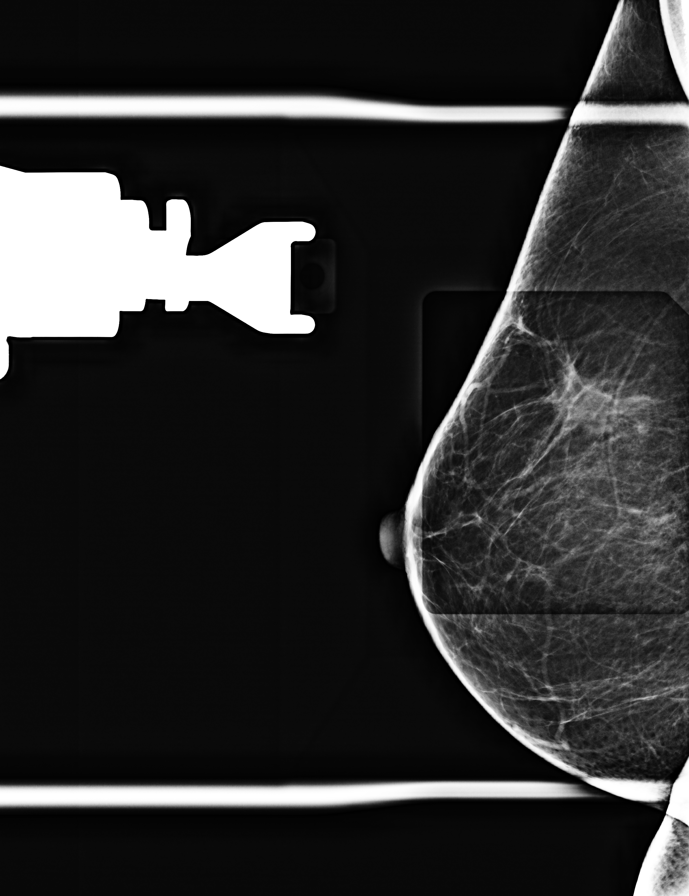

[L CC (1 of 4)]
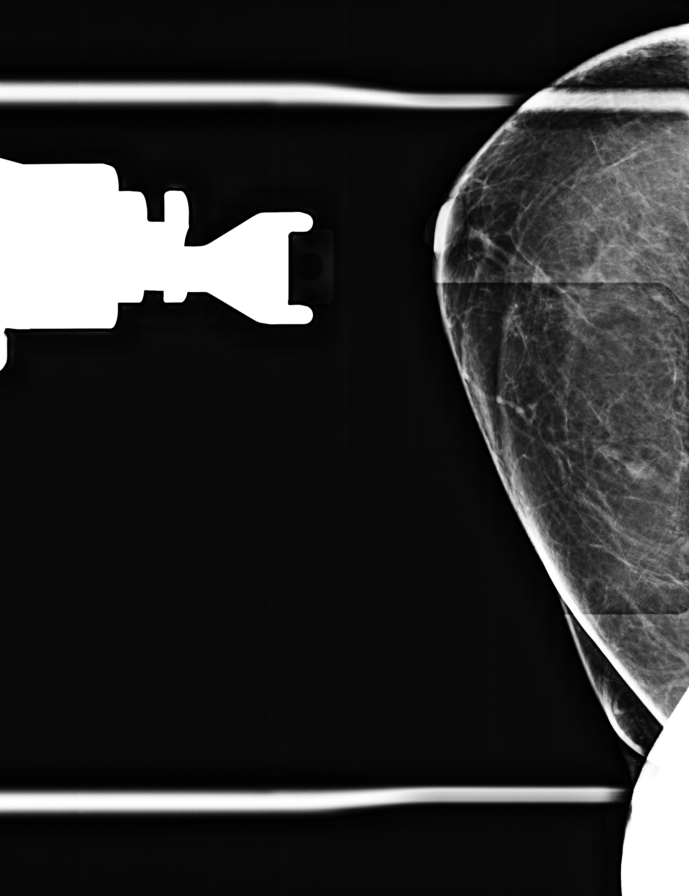

[L CC (2 of 4)]
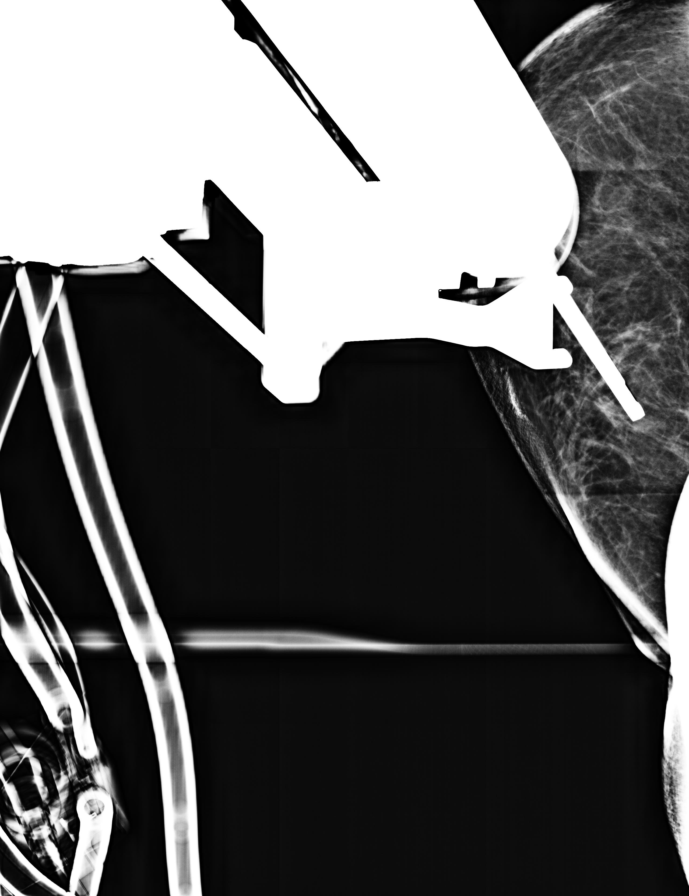

[L CC (3 of 4)]
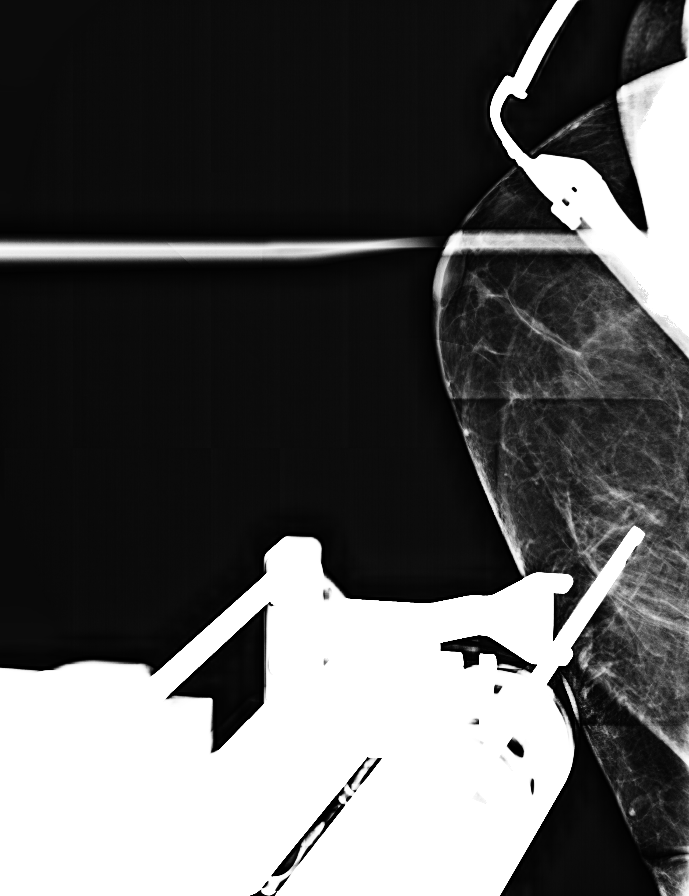

[L CC (4 of 4)]
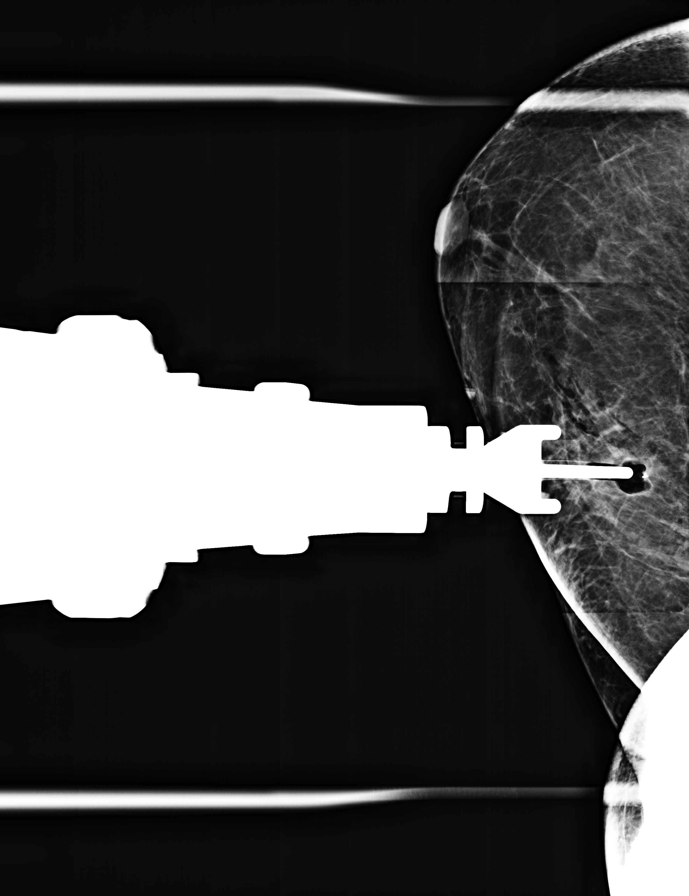

[L LM tomo · 2 of 62 frames shown]
[frame 21/62]
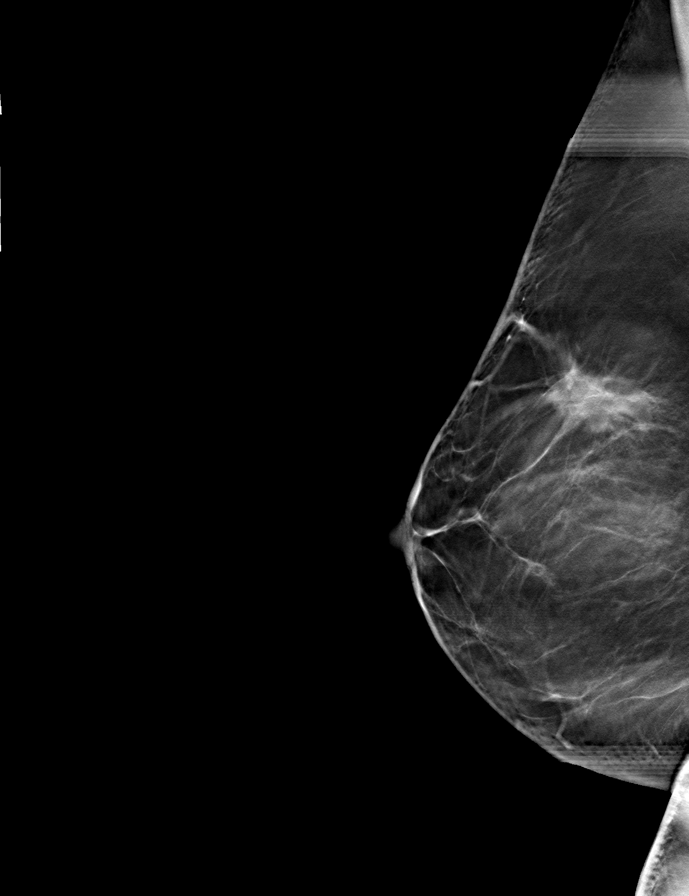
[frame 31/62]
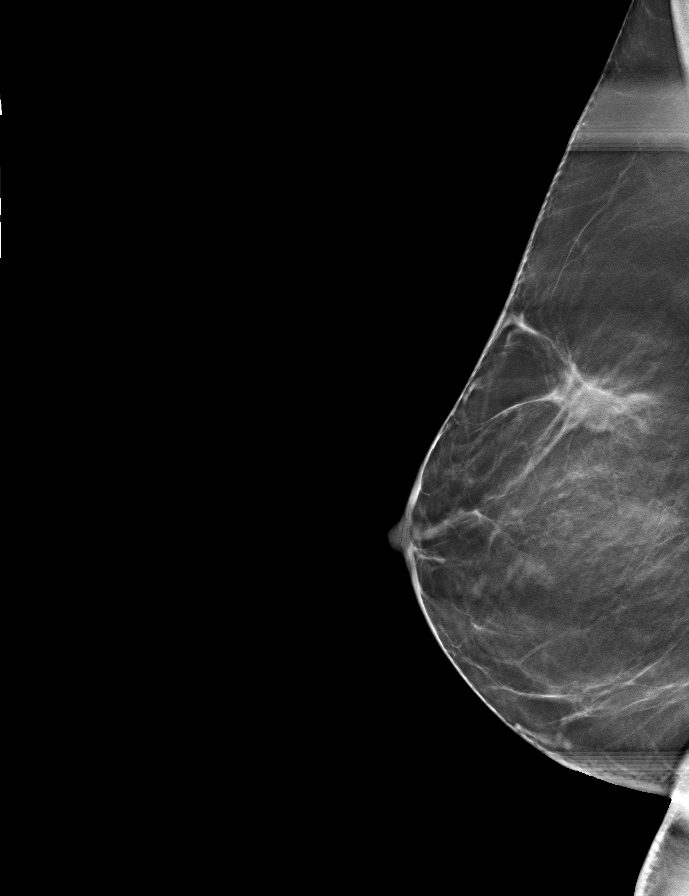

[L CC tomo · tomo slice 40/79.0]
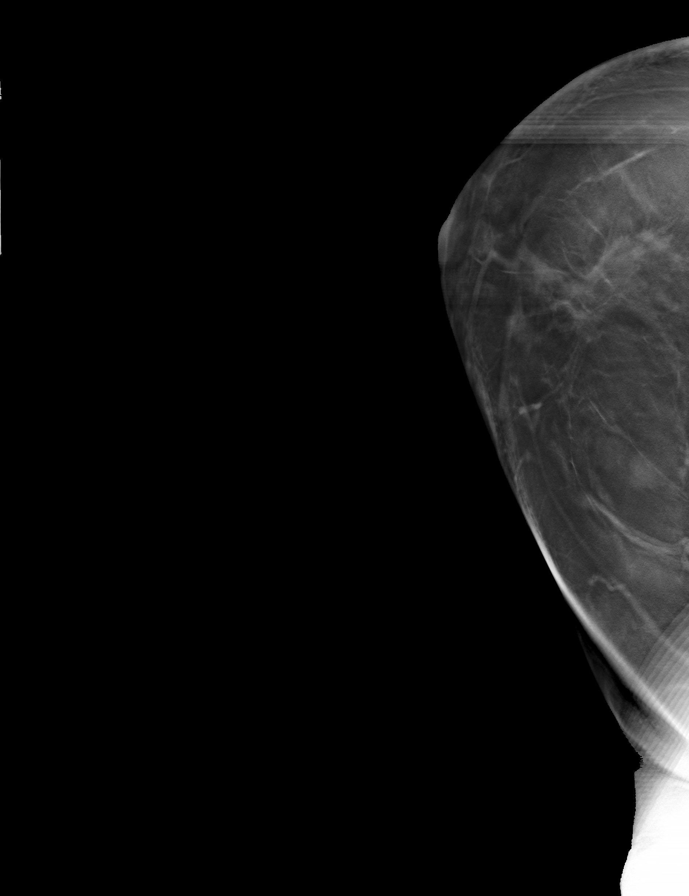

[8 of 15 positions shown; findings below may reference images not displayed]

PROCEDURE:

Patient consent: Prior to the procedure risks, complications, alternatives and a description of the procedure were discussed. All questions were answered.  Informed consent was obtained and signed.  

Procedure description:

A stereotactic guided biopsy was performed for the 9 mm oval indistinct mass in the left breast at 2 o'clock, posterior depth.  This was described in the prior mammogram report. The skin was prepped in the usual manner. The local area was anesthetized with 1% lidocaine.  A skin nick was made in the breast.  The abnormality was approached superiorly using an upright digital mammography unit.  A 9 gauge Suros-Eviva vacuum-assisted biopsy needle was placed adjacent to the abnormality under computer guidance and confirmatory stereotactic mammography images were obtained to document needle placement.  Once the needle was documented to be in the correct location, multiple specimens were obtained.     A clip was inserted into the biopsy cavity. Hemostasis was achieved with manual compression. Sterile skin strips were applied to the access site.  

The patient was scheduled for 2 stereotactic biopsies today for a left breast mass at 2 o'clock and for an asymmetry in the left lateral breast. On today's imaging, the asymmetry in the left lateral breast did not persist, suggesting overlapping benign fibroglandular tissue. Therefore, the second biopsy of the left lateral asymmetry was canceled.

Post procedure digital mammographic imaging demonstrates the clip at the appropriate biopsy site location.    The specimens were sent to the laboratory for pathological analysis. The patient was given appropriate post care instructions and discharged in good condition.
IMPRESSION: MALIGNANT

1. Stereotactic guided biopsy of the 9 mm oval indistinct mass in the left breast at 2 o'clock was successful with no apparent post procedure complications.  

2. Pathology indicates invasive mammary carcinoma. Pathology results are concordant with imaging findings.  Recommend surgical and oncologic management. An MRI is also strongly recommended given possible additional mammographic asymmetry in the left 2 o'clock breast that was unable to be biopsied today due to inconspicuity.

3. A voicemail was left with the referring oncologist nurse. The biopsy results and follow-up recommendations were discussed with the patient. The patient reports no complications on postbiopsy day 1.

## 2019-08-20 IMAGING — US US SOFT TISSUE UPR EXT LT
1 series · 7 of 7 positions shown · non-contrast
Comparison: None.

HISTORY: Left arm, antecubital fossa, swelling and pain. Patient has breast cancer.
TECHNIQUE: Soft tissue ultrasound of left antecubital fossa region at the area of clinical concern performed.

[Series 1: us soft tissue upr ext left · 7 of 7 slices shown]
[im 1/7]
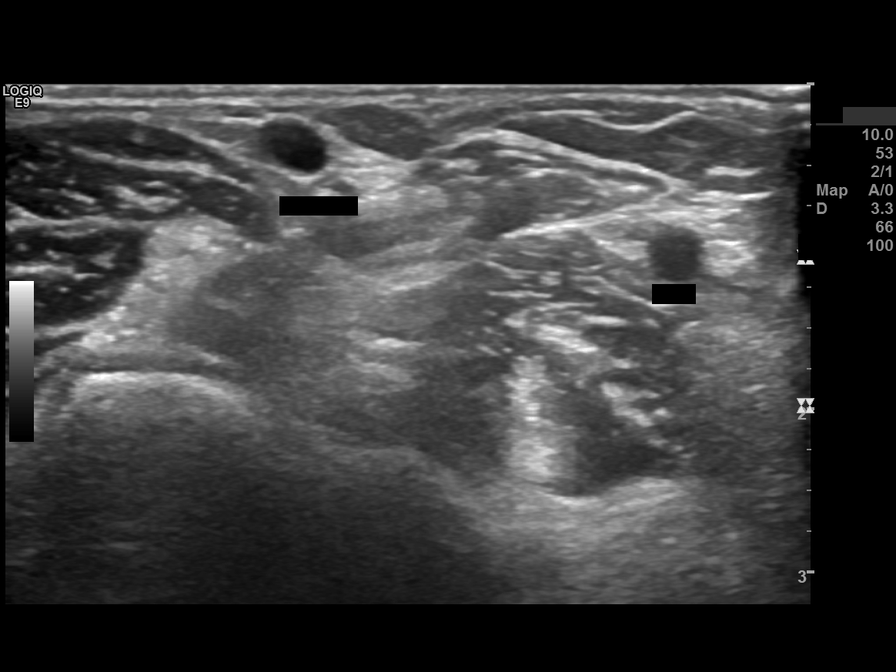
[im 2/7]
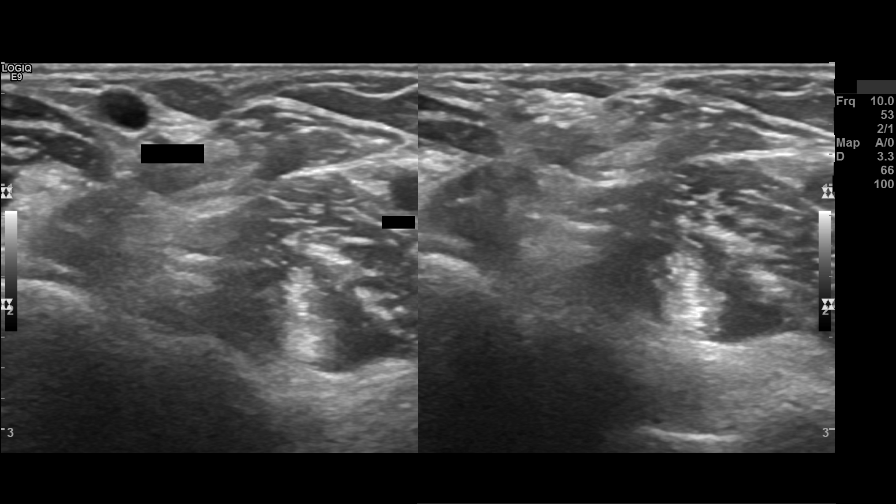
[im 3/7]
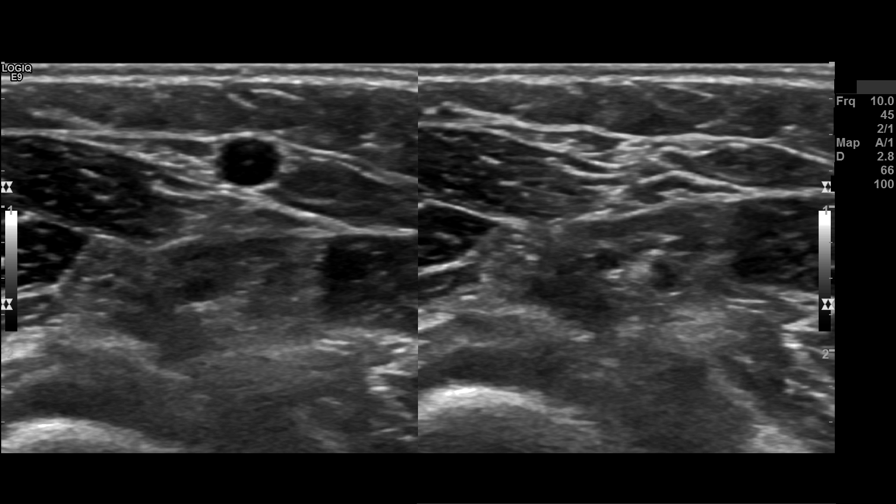
[im 4/7]
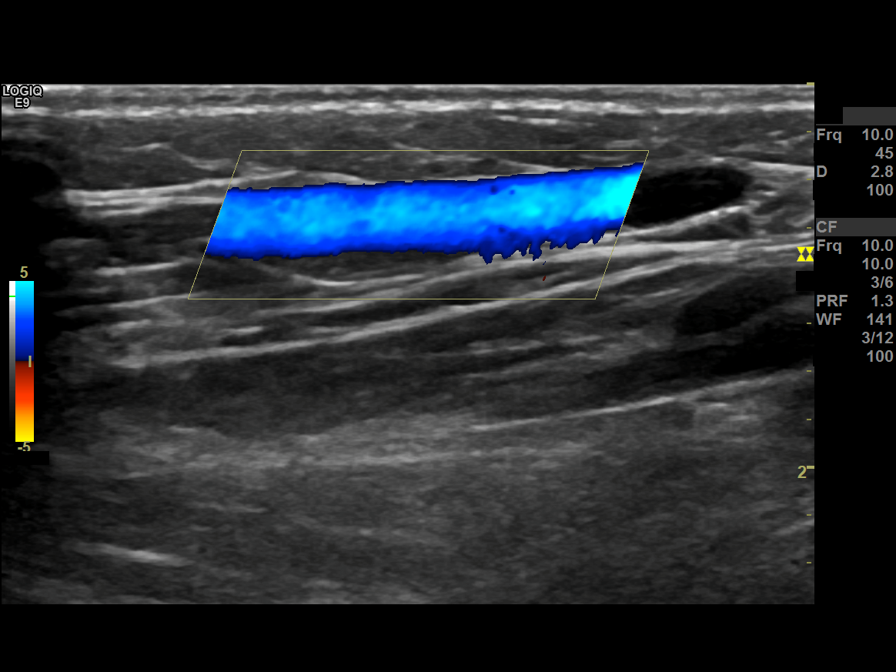
[im 5/7]
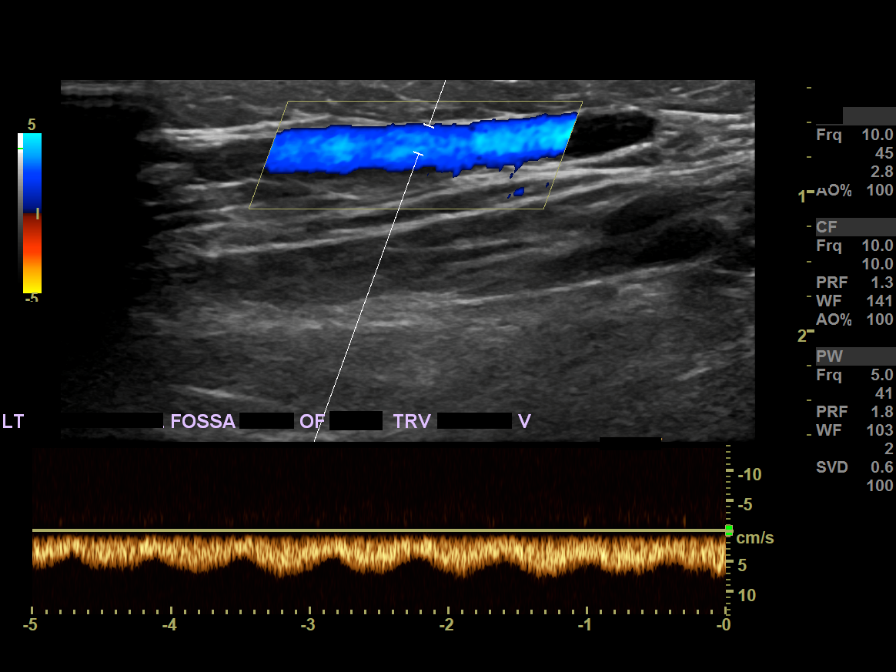
[im 6/7]
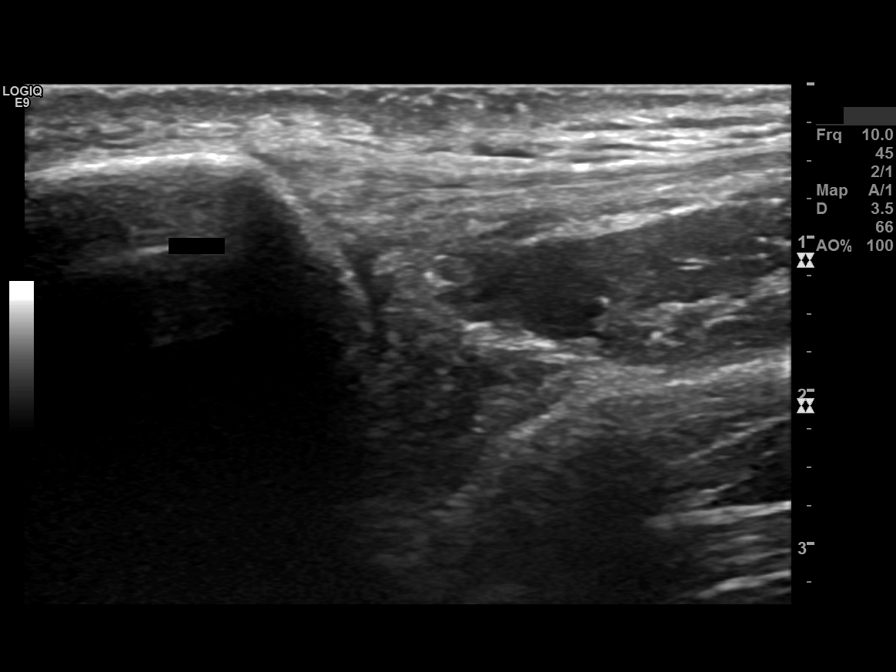
[im 7/7]
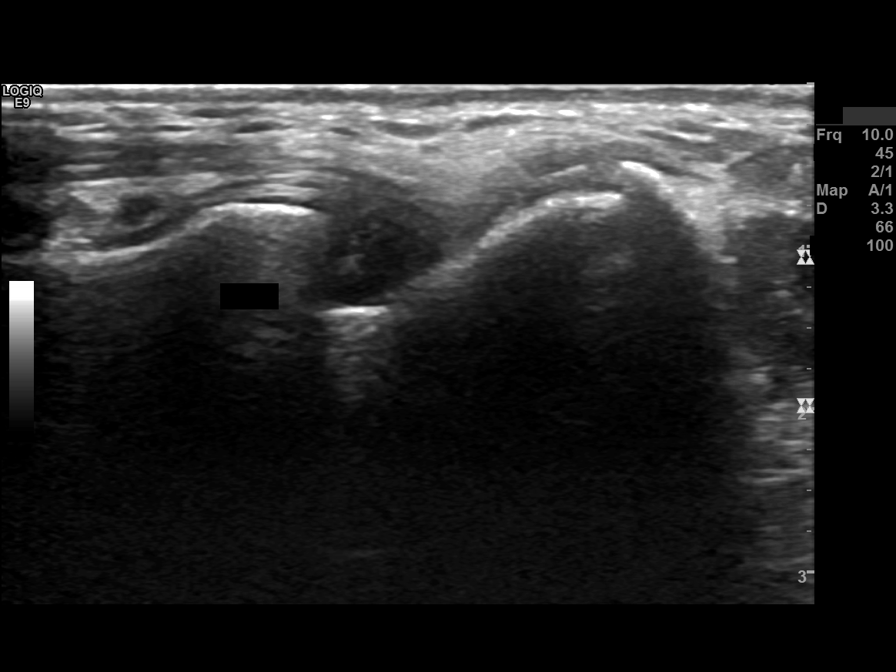

[7 of 7 positions shown; findings below may reference images not displayed]

FINDINGS: No mass, cyst or enlarged lymph node at the area of clinical concern is seen.
IMPRESSION: Negative study.

## 2019-08-20 IMAGING — US DOP VENOUS UPR EXT LT
1 series · 15 of 24 positions shown · non-contrast
Comparison: None

INDICATION: Left upper extremity pain and/or swelling
TECHNIQUE: Left upper extremity venous Doppler.

[Series 2: dop venous upr ext left · portal-venous · 15 of 30 slices shown]
[im 1/30]
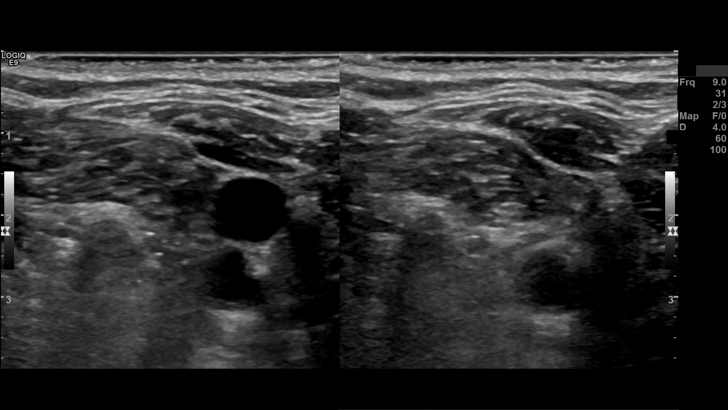
[im 3/30]
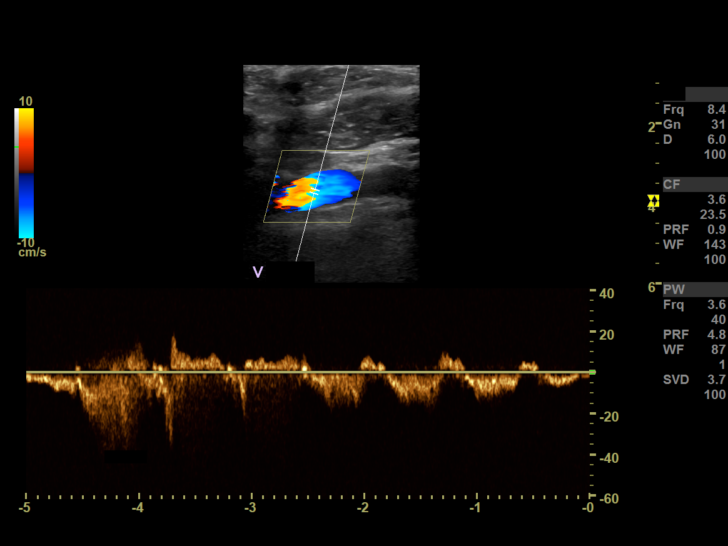
[im 6/30]
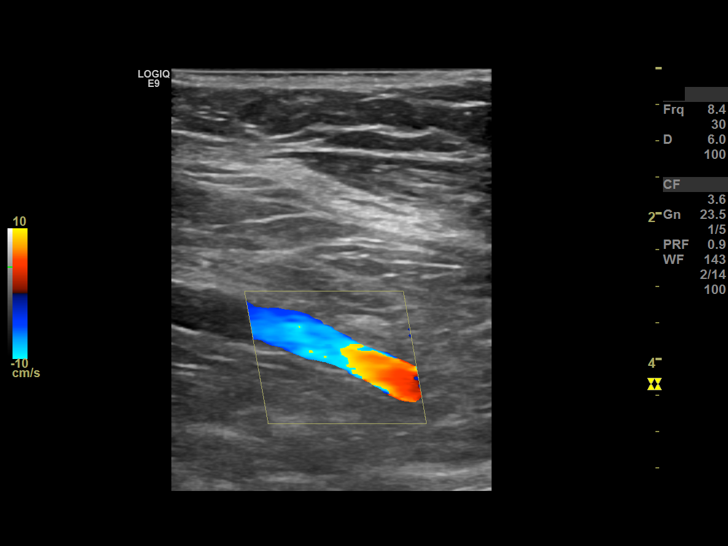
[im 7/30]
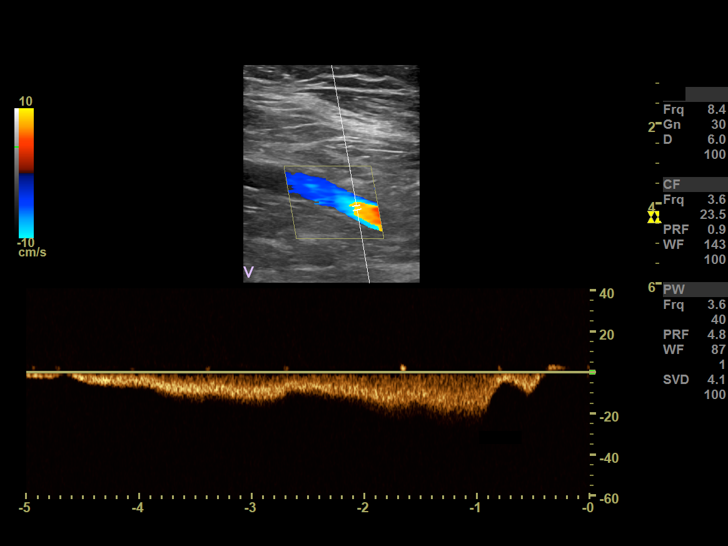
[im 9/30]
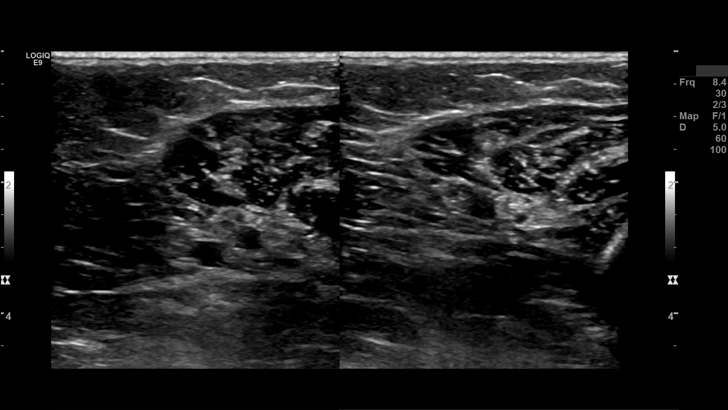
[im 11/30]
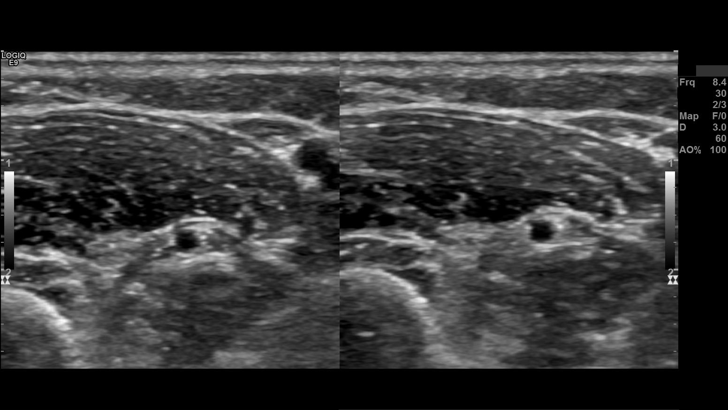
[im 13/30]
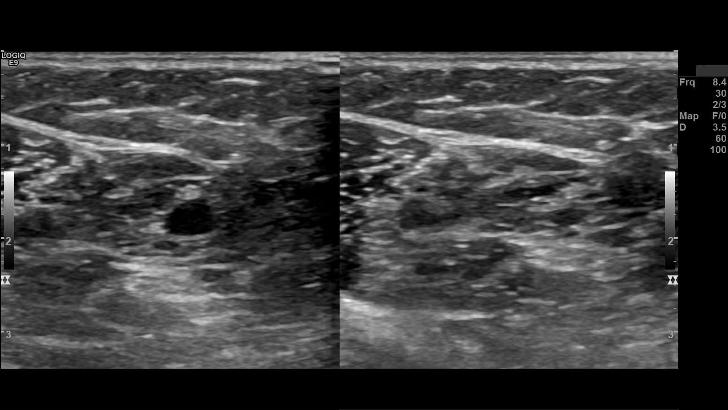
[im 16/30]
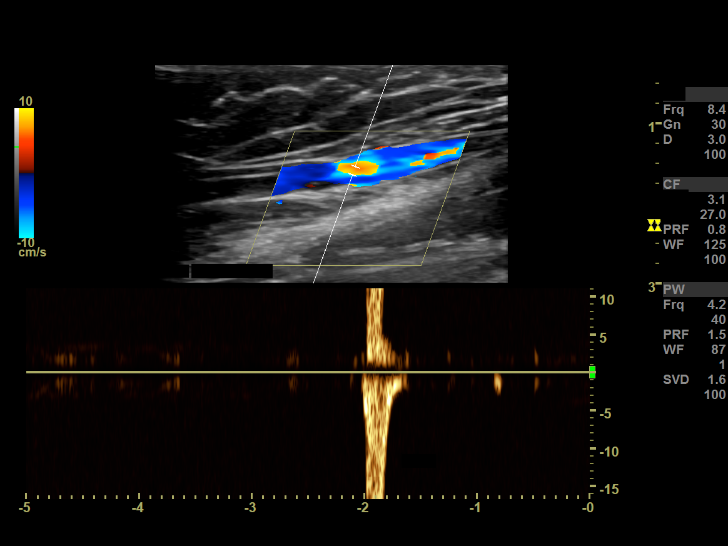
[im 17/30]
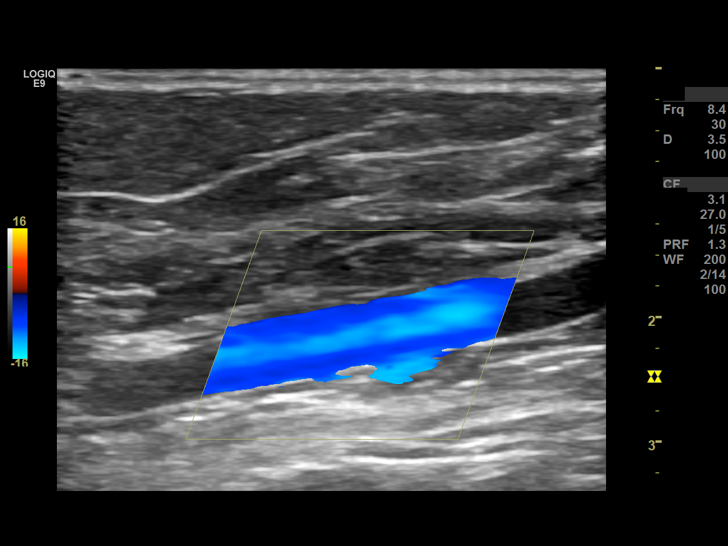
[im 19/30]
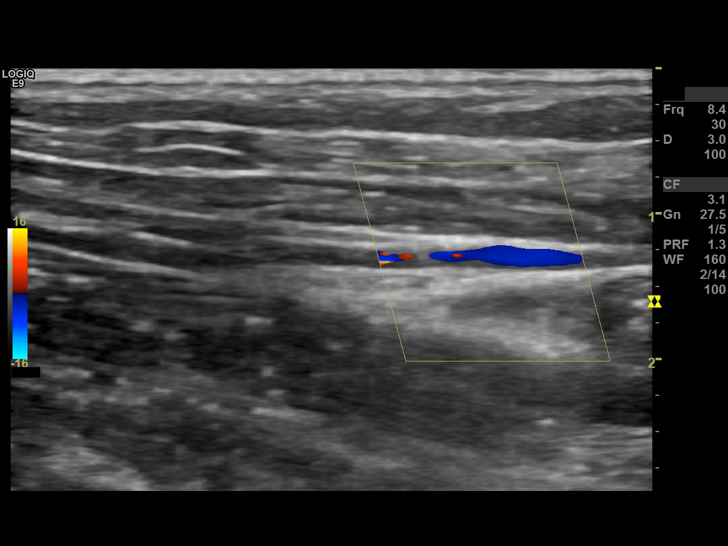
[im 21/30]
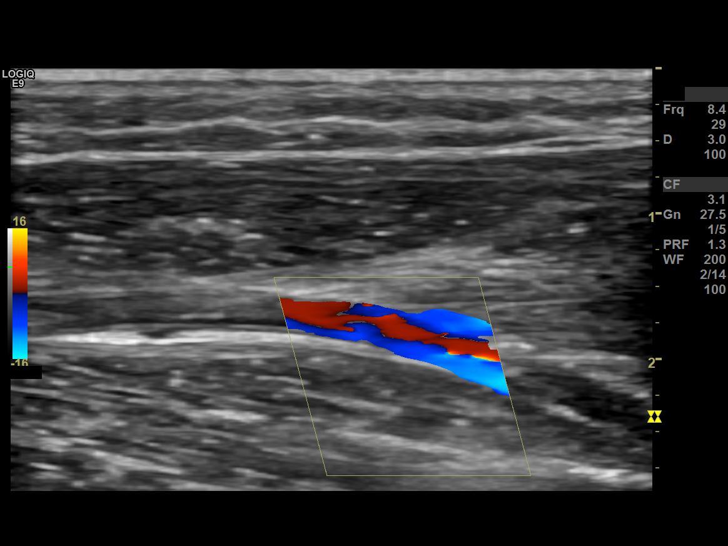
[im 23/30]
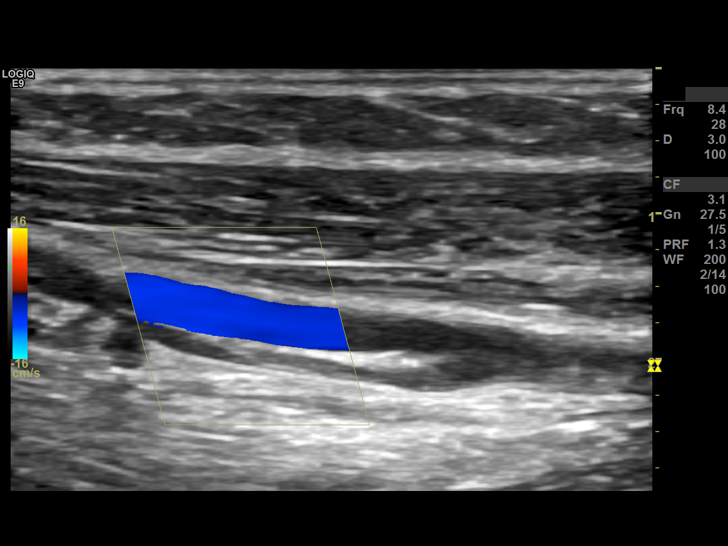
[im 26/30]
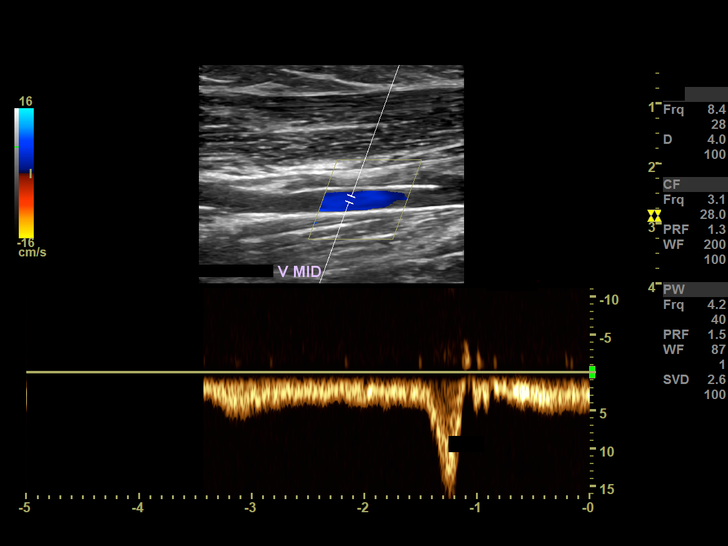
[im 27/30]
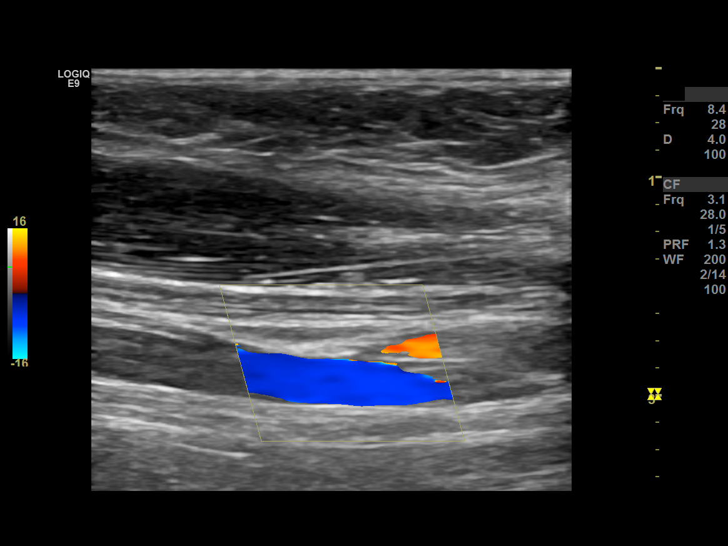
[im 30/30]
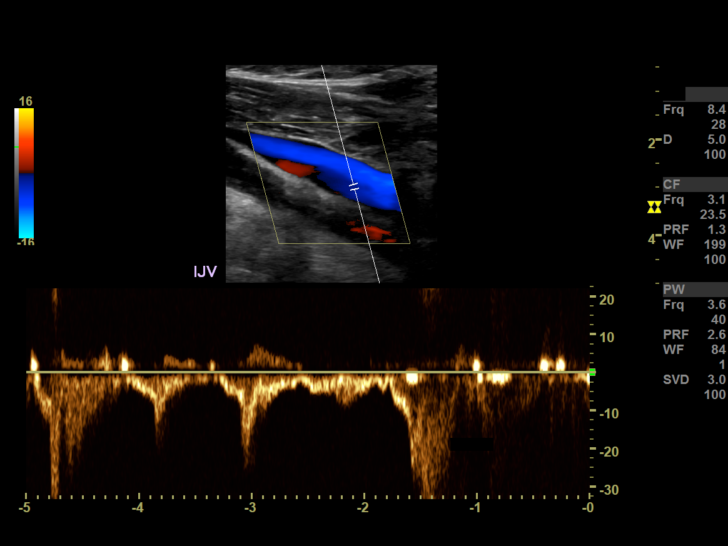

[15 of 24 positions shown; findings below may reference images not displayed]

FINDINGS: The veins of the left upper extremity including the subclavian vein, jugular vein, axillary vein, brachial vein, basilic vein and cephalic vein were interrogated were interrogated. Flow is present within the left upper extremity vessels. They demonstrate normal compressibility and augmentation of flow with appropriate maneuvers.
IMPRESSION: No evidence of left upper extremity DVT.

## 2019-08-20 IMAGING — US DOP ARTERIAL UPR EXT LT
1 series · 14 of 15 positions shown · non-contrast
Comparison: None

REASON FOR EXAM: Left arm, antecubital fossa, swelling, pain, patient has breast cancer
TECHNIQUE: Multiple gray-scale and color-flow images of the left upper extremity arteries were obtained with ultrasound.  Spectral Doppler analysis was also performed.

[Series 3: dop arterial upr ext left · arterial · 14 of 15 slices shown]
[im 1/15]
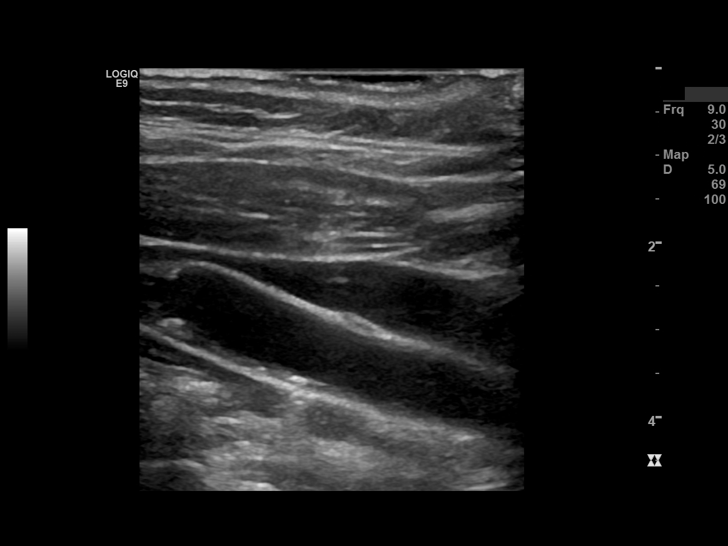
[im 2/15]
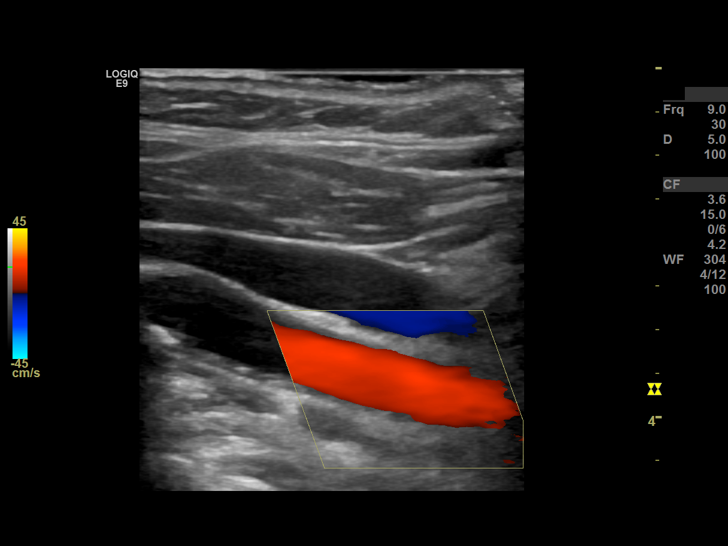
[im 3/15]
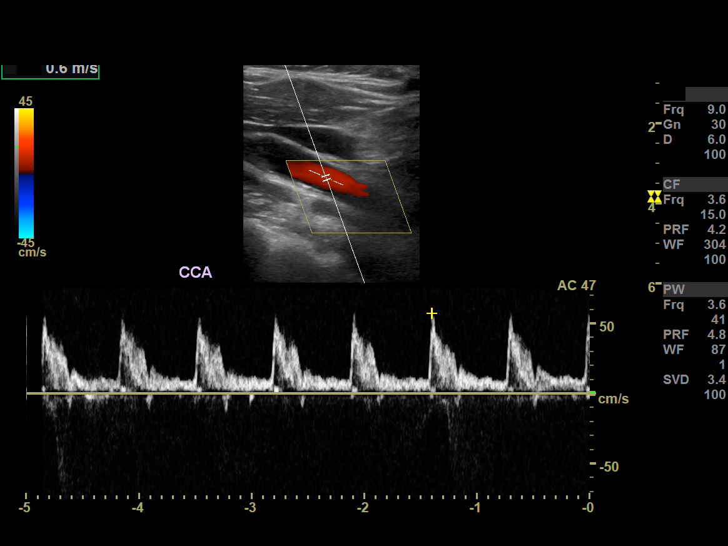
[im 4/15]
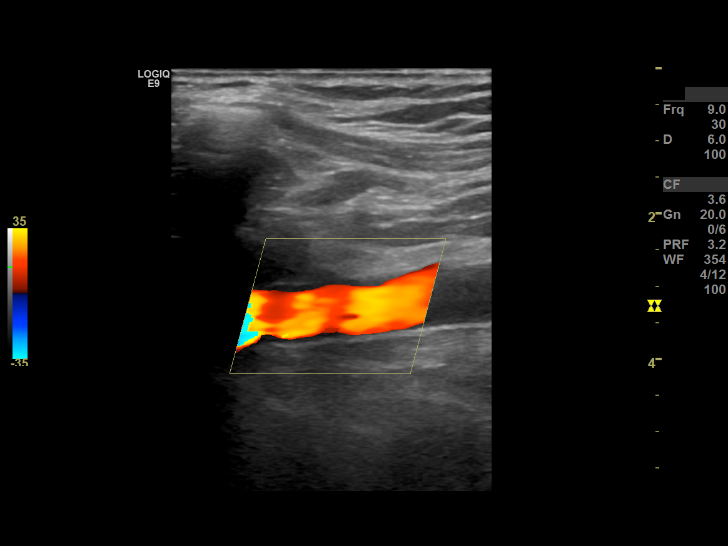
[im 5/15]
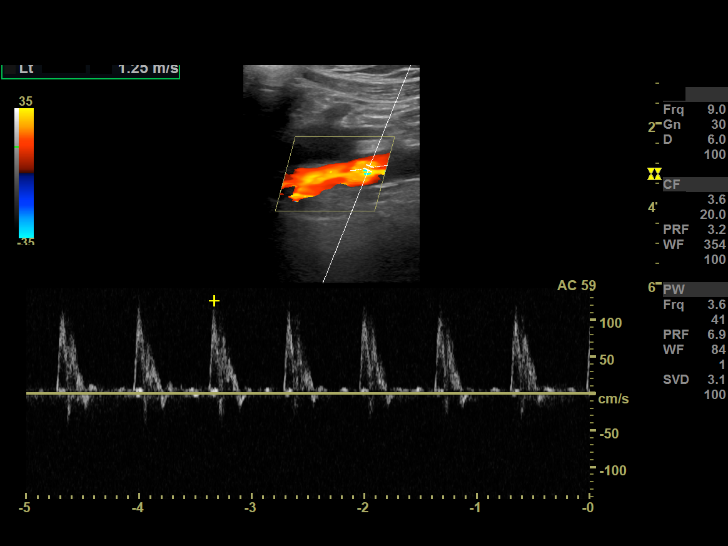
[im 6/15]
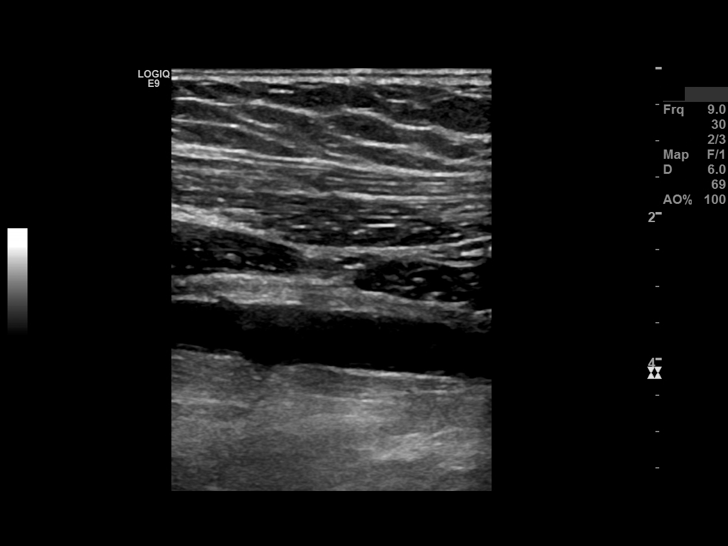
[im 7/15]
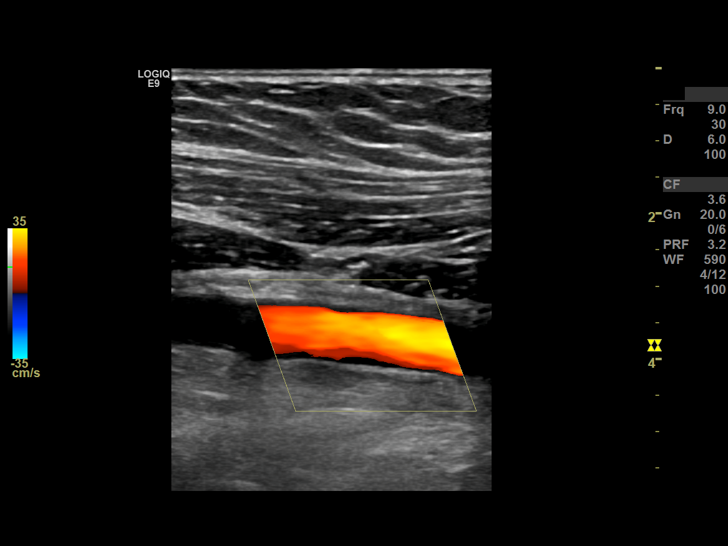
[im 9/15]
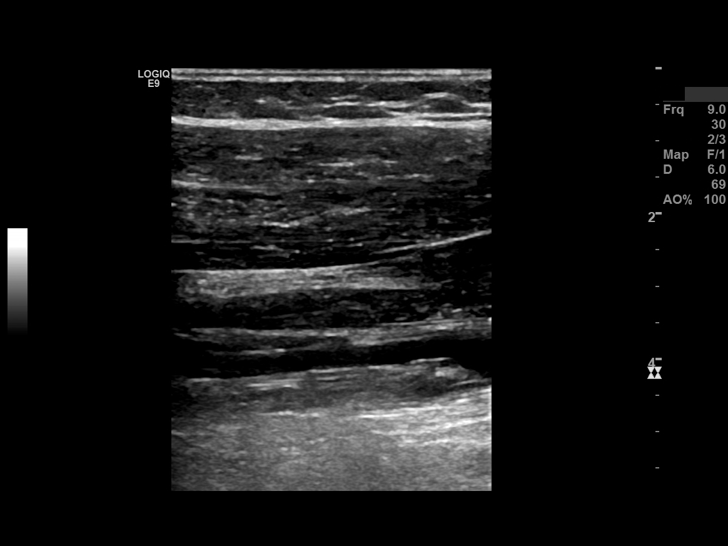
[im 10/15]
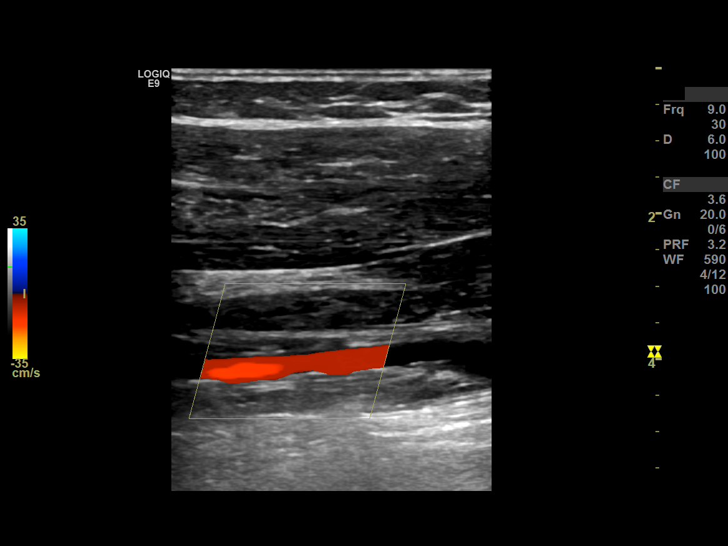
[im 11/15]
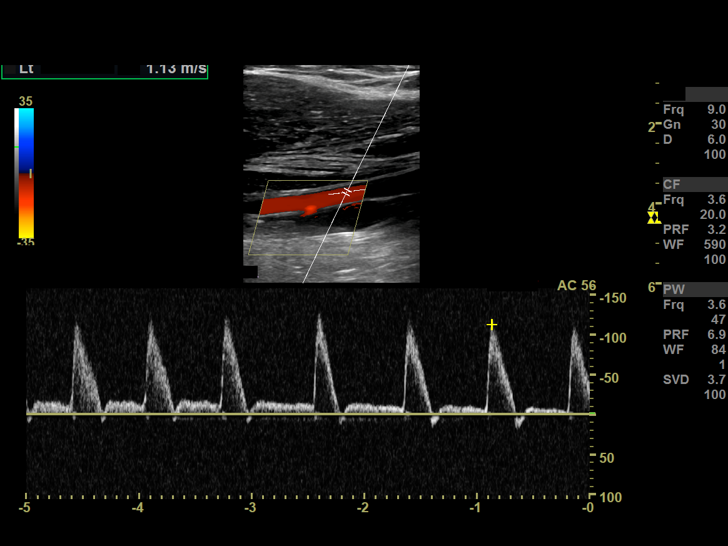
[im 12/15]
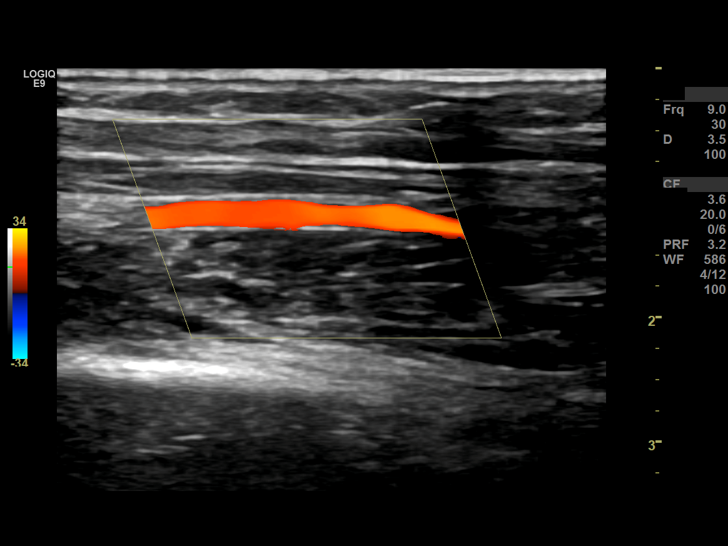
[im 13/15]
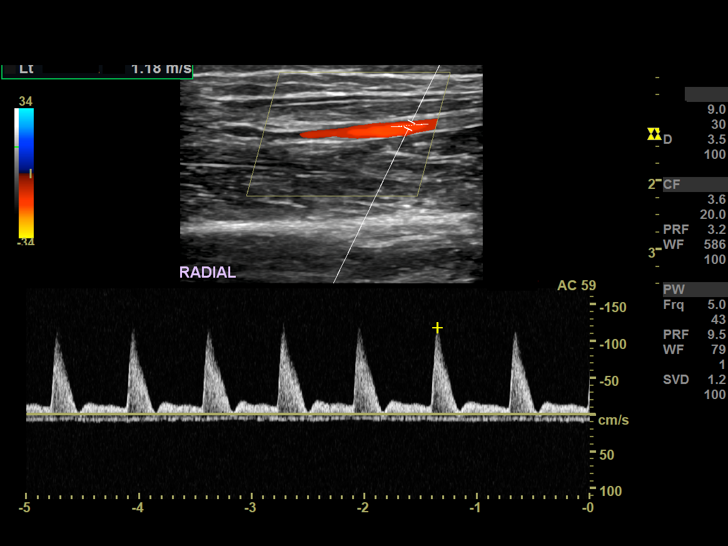
[im 14/15]
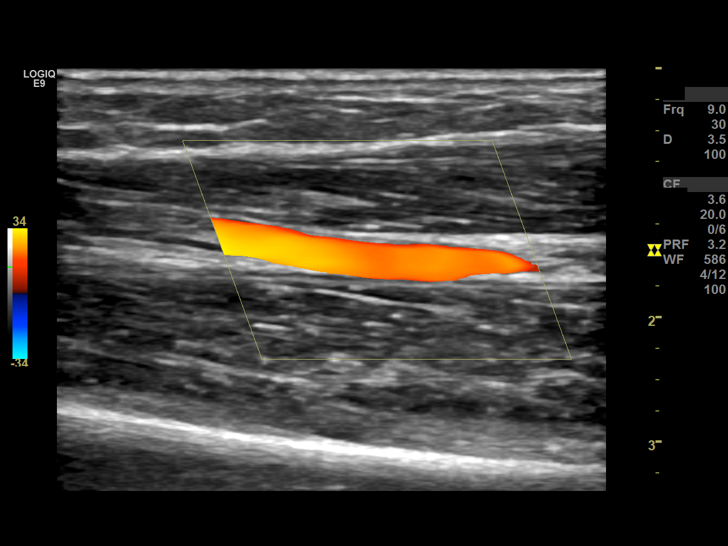
[im 15/15]
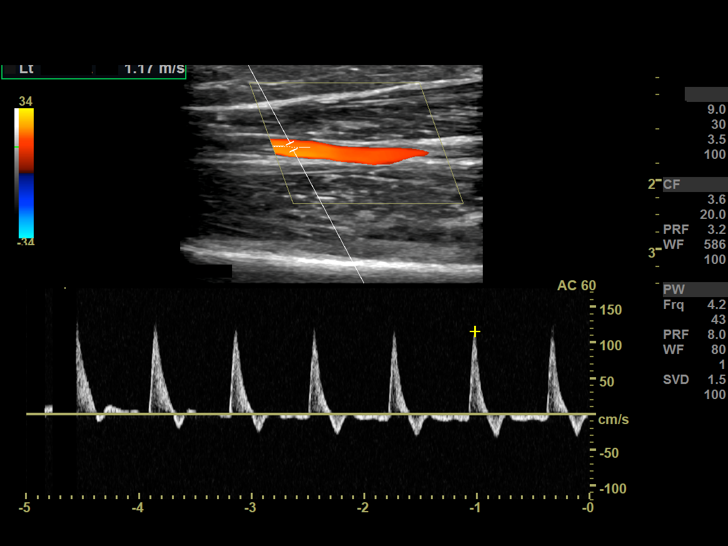

[14 of 15 positions shown; findings below may reference images not displayed]

FINDINGS: LEFT

Subclavian artery velocity: 1.25 m/s

Axillary artery velocity: 1.22 m/s

Brachial artery velocity: 1.13 m/s

Waveforms: Triphasic waveforms throughout the upper extremity arteries

Gradient: No significant velocity gradient
IMPRESSION: No hemodynamically significant stenosis of the left upper extremity arteries.

## 2019-09-13 IMAGING — CT CT CHEST/ABD/PELVIS W CON
2 of 5 series · 12 of 46 positions shown, 14 images · IV contrast (agent unspecified)
Comparison: PET/CT 03/18/2017, 11/25/2017, and 06/19/2018 and diagnostic left mammogram 10/06/2018.

HISTORY: 68-year-old female. Invasive mammary carcinoma of the left breast diagnosed [DATE]. History of right upper lung invasive adenocarcinoma diagnosed in 2995, treated with right upper lobectomy and left breast carcinoma diagnosed in 1227, treated with lumpectomy.
TECHNIQUE: CT chest, abdomen, and pelvis study was performed with IV contrast using axial images. Sagittal and coronal reconstructed images were obtained. 

IV contrast: 100 cc Ssovue-L43. Serum creatinine 0.6 mg/dL.

[Series 3: soft tissue · axial · 0.66mm/px · z∈[-1574,-1024]mm · 9 of 132 slices shown, 11 images]
[im 11/132  soft-tissue]
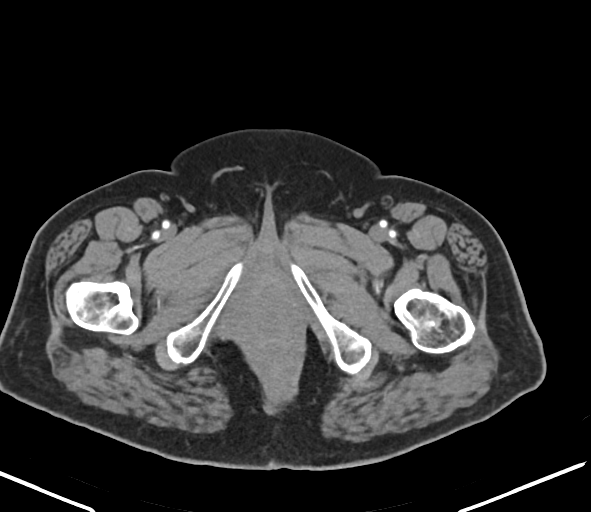
[im 11/132  bone]
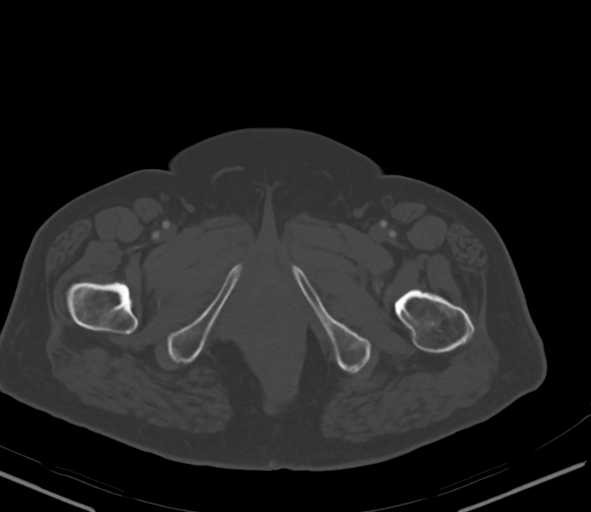
[im 31/132  soft-tissue]
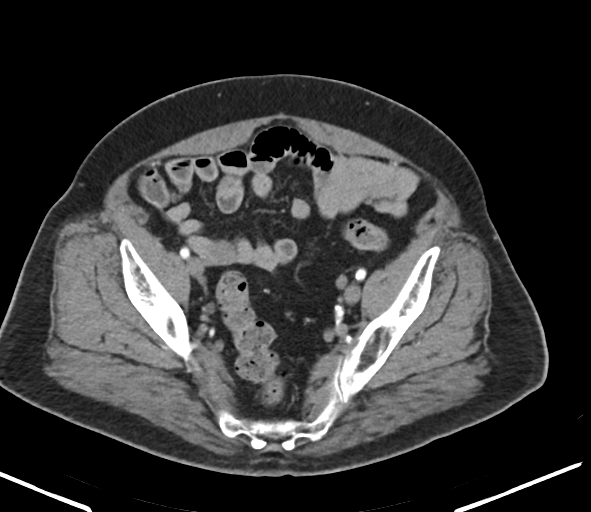
[im 41/132  soft-tissue]
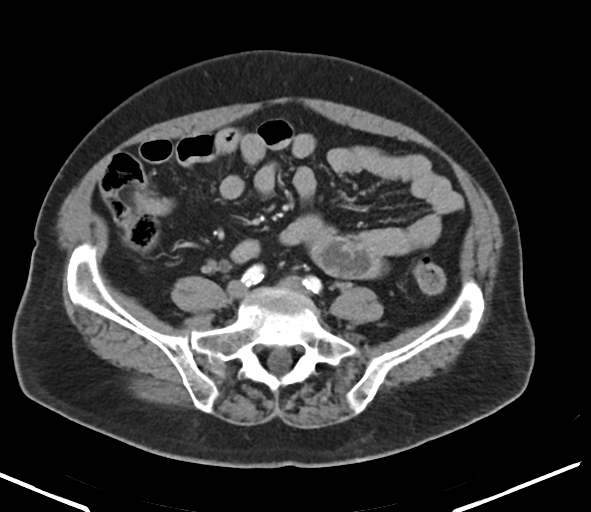
[im 51/132  soft-tissue]
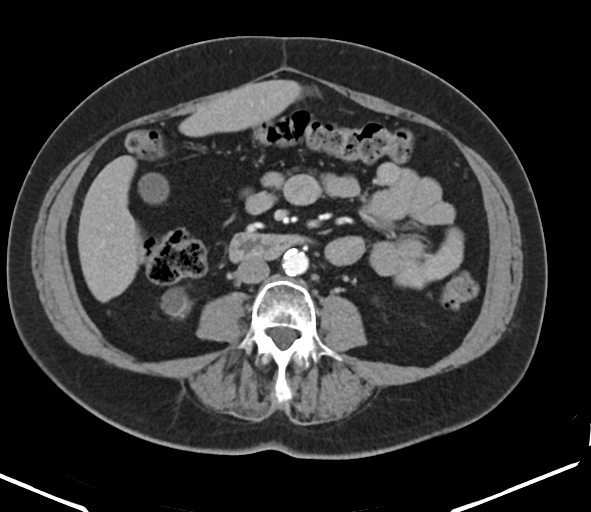
[im 71/132  soft-tissue]
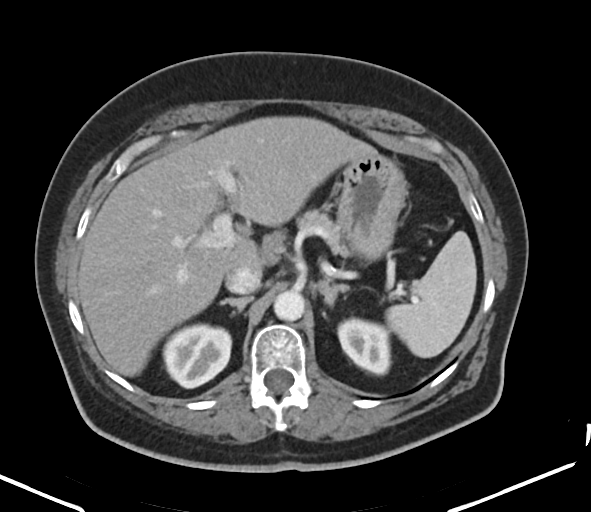
[im 81/132  soft-tissue]
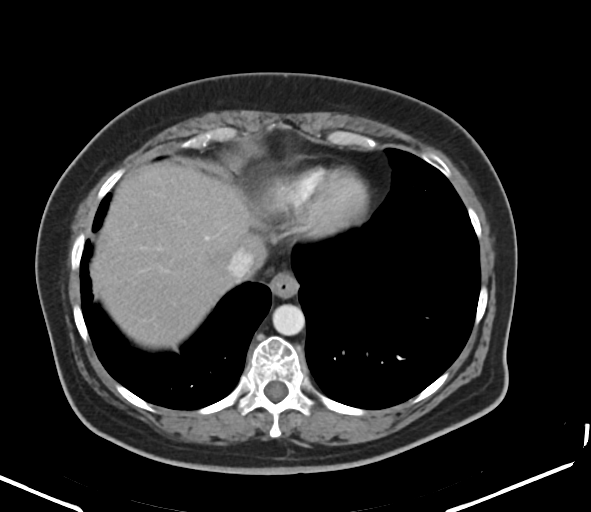
[im 91/132  soft-tissue]
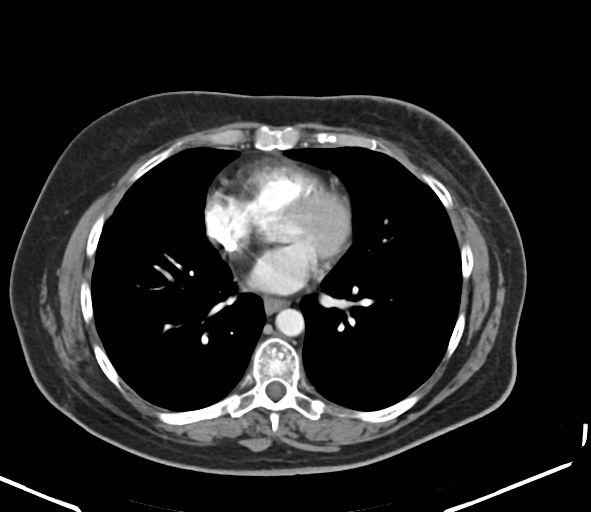
[im 111/132  soft-tissue]
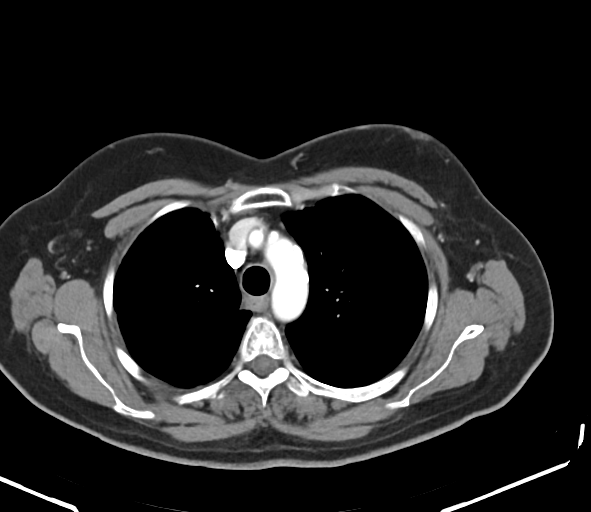
[im 121/132  soft-tissue]
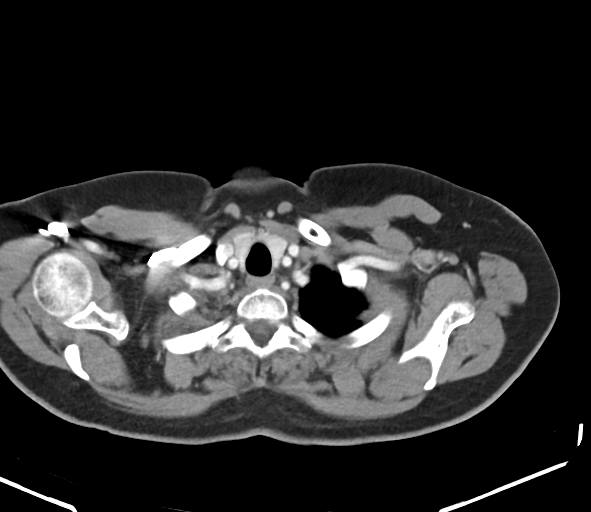
[im 121/132  bone]
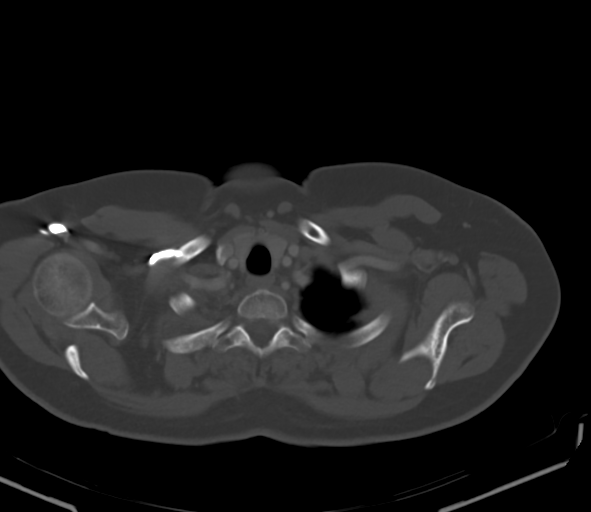

[Series 5: coronal · coronal · 0.77mm/px · 3 of 67 slices shown]
[im 23/67  soft-tissue]
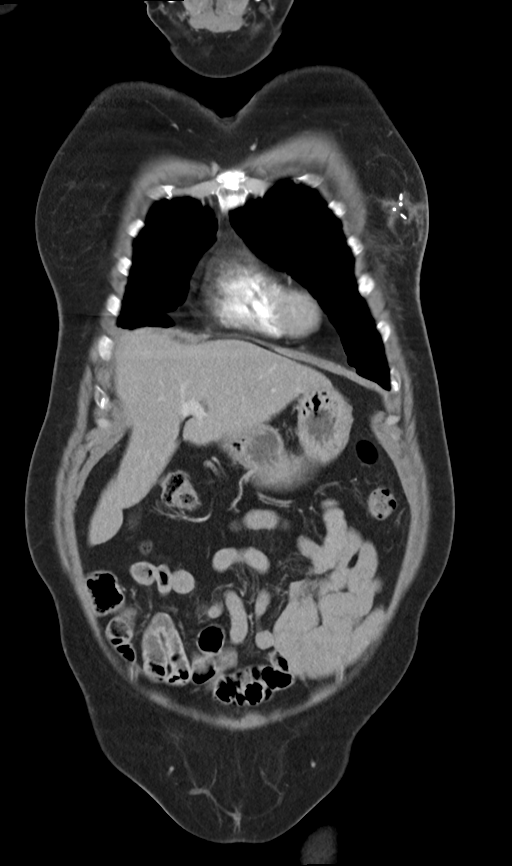
[im 30/67  soft-tissue]
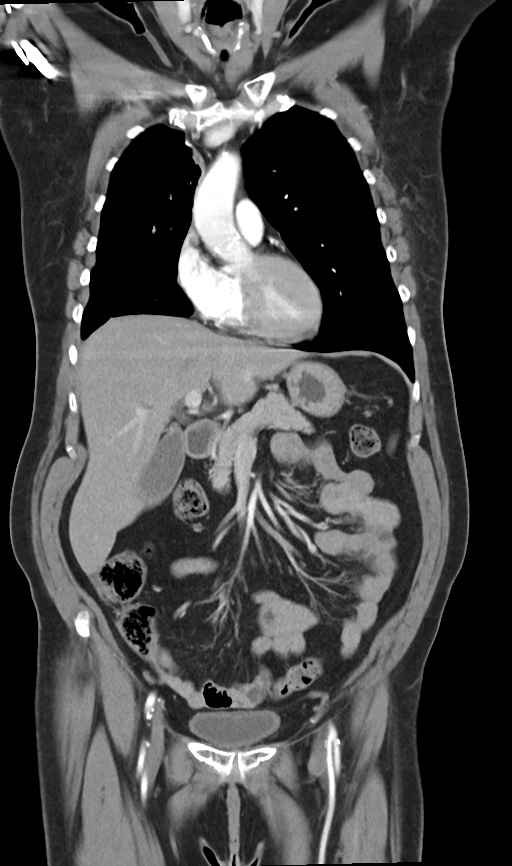
[im 37/67  soft-tissue]
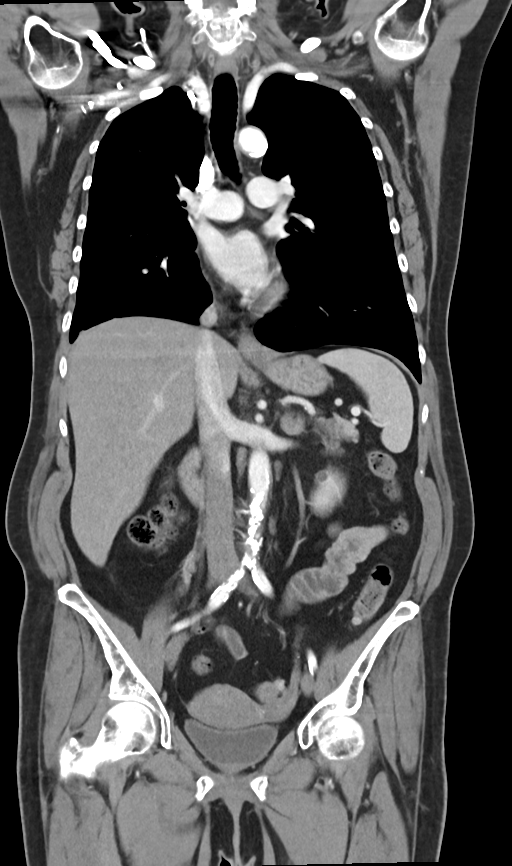

[12 of 46 positions shown; findings below may reference images not displayed]

FINDINGS: CHEST:

Visualized lower neck: Moderate atherosclerosis of the bilateral common carotid arteries, greater on the right. The thyroid is unremarkable.

Axilla: Postsurgical changes from left breast lumpectomy and left axillary node dissection. Biopsy clip in the upper outer quadrant of the left breast centered in a 9 mm nodule as seen on the previous mammogram. Adjacent surgical clips and linear soft tissue thickening in an area measuring 32 x 28 mm, likely scarring. Separate area of linear soft tissue thickening and scarring in the central left breast. No axillary or internal mammary lymphadenopathy.

Mediastinum/hila: No lymphadenopathy. Tiny hiatal hernia with circumferential mild thickening of the lower thoracic esophagus, artifact due to underdistention versus esophagitis.

Cardiovascular: Coronary artery and mild aortic calcifications. No cardiomegaly or pericardial effusion.

Pleura: No pleural effusion or thickening. No pneumothorax.

Lungs: Postsurgical changes from right upper lobectomy. No suspicious pulmonary nodule, mass, or airspace consolidation. Mild centrilobular emphysema in the bilateral upper lungs.

ABDOMEN AND PELVIS:

Liver: Enlarged, measuring 20.1 cm. Hepatic steatosis. No focal lesion.

Gallbladder/bile ducts: Sludge and/or cholelithiasis. No gallbladder wall thickening or pericholecystic edema. Normal caliber bile ducts.

Spleen: No focal lesion or splenomegaly.

Stomach: Unremarkable.

Pancreas: No mass or inflammation.

Adrenal glands: Stable fusiform thickening of the left adrenal gland, likely on the basis of hyperplasia. No focal lesion.

Kidneys and collecting system: Small bilateral renal cysts. No suspicious renal lesion, nephrolithiasis, or hydronephrosis.

Bowel: Diverticulosis in the descending and sigmoid colon. No bowel obstruction or inflammation. 

Peritoneum: Unremarkable.

Lymphadenopathy: None.

Vasculature: Moderate to severe atherosclerosis of the abdominal aorta and its branches. No aneurysm.

Pelvis: The visualized pelvic organs are within normal limits.

Visualized osseous structures: Diffuse osseous demineralization. Chronic mild compression deformity of T12 and moderate compression deformity of L3. No acute fracture or suspicious osseous lesion. 

Soft tissues: Unremarkable.
IMPRESSION: 9 mm soft tissue nodule in the upper outer quadrant of the left breast with biopsy clip, likely representing known malignancy. Adjacent linear soft tissue thickening likely represents scarring. No evidence of nodal or distant metastases.

Chronic and incidental findings, unchanged as detailed above.

Total radiation dose to patient is CTDIvol 9.44 mGy and DLP 653.98 mGy-cm.

## 2020-02-23 IMAGING — CT CT CHEST/ABD/PELVIS W CON
2 of 5 series · 13 of 46 positions shown, 15 images · non-contrast
Comparison: CT of chest, abdomen and pelvis, 09/13/2019.

HISTORY: Prior history of breast and bronchogenic carcinoma. Restaging.
TECHNIQUE: Scans of the chest, abdomen and pelvis are performed at 5 mm increments with 2 mm lung reconstructions and coronal and sagittal reconstructions following intravenous administration of 100 mL of Psovue-YHD. I-STAT creatinine is 0.6 mg/dL.

Total radiation dose to patient is CTDIvol 10.41 mGy and DLP 684.95 mGy-cm.

[Series 2: soft tissue · axial · 0.68mm/px · z∈[-1600,-1105]mm · 10 of 119 slices shown, 12 images]
[im 10/119  soft-tissue]
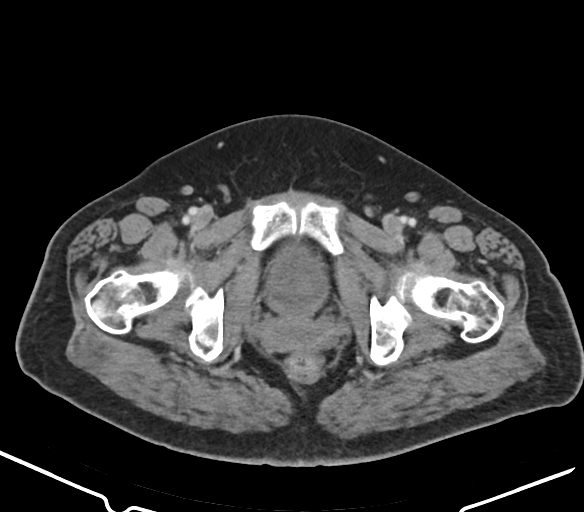
[im 10/119  bone]
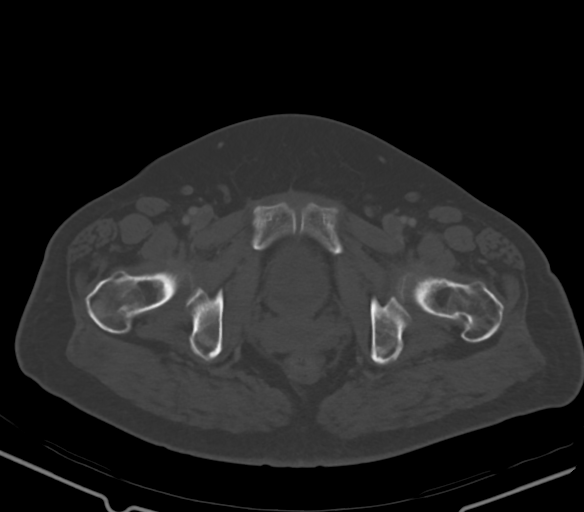
[im 19/119  soft-tissue]
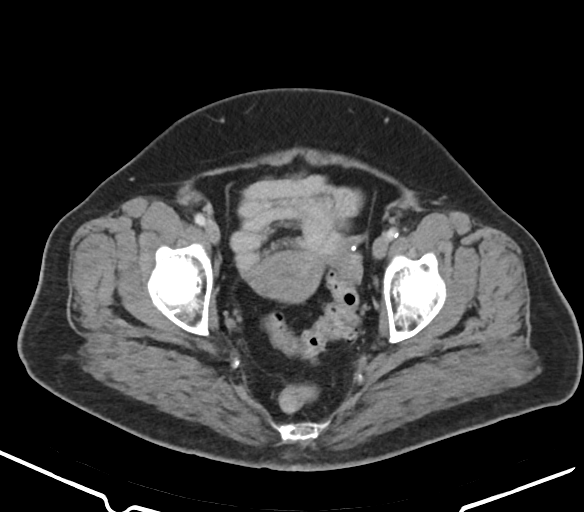
[im 37/119  soft-tissue]
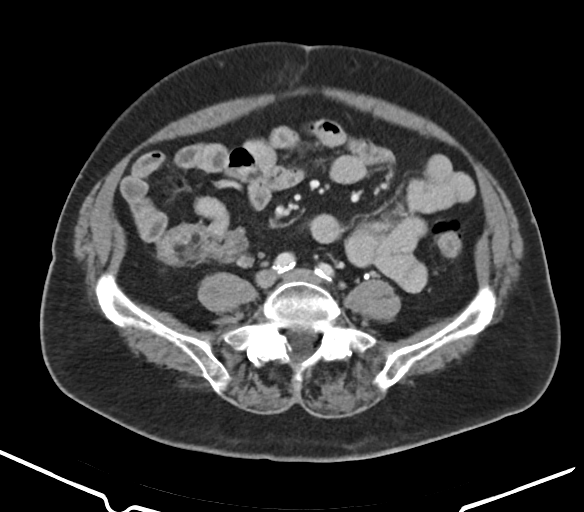
[im 46/119  soft-tissue]
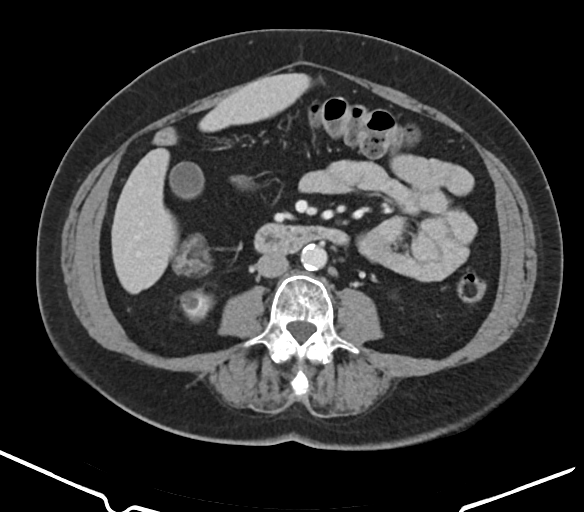
[im 55/119  soft-tissue]
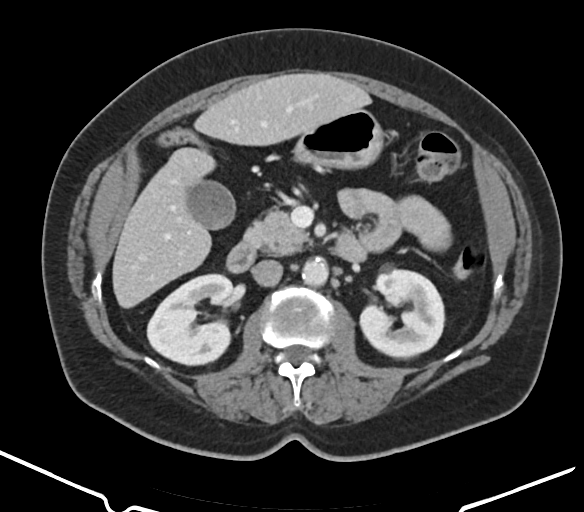
[im 64/119  soft-tissue]
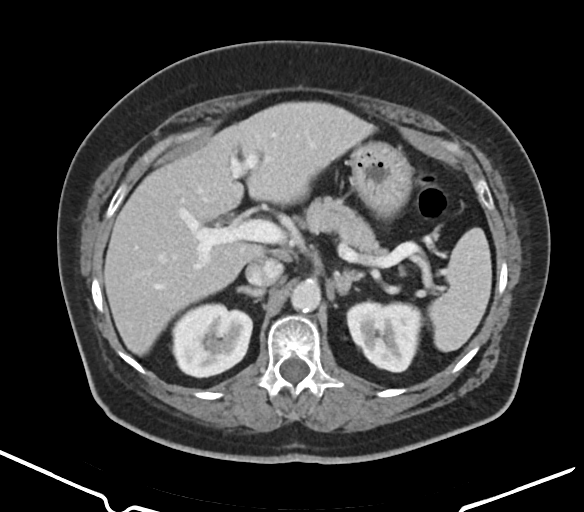
[im 73/119  soft-tissue]
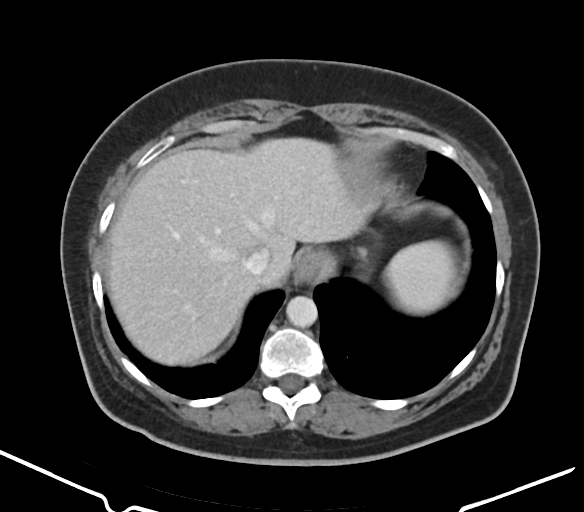
[im 91/119  soft-tissue]
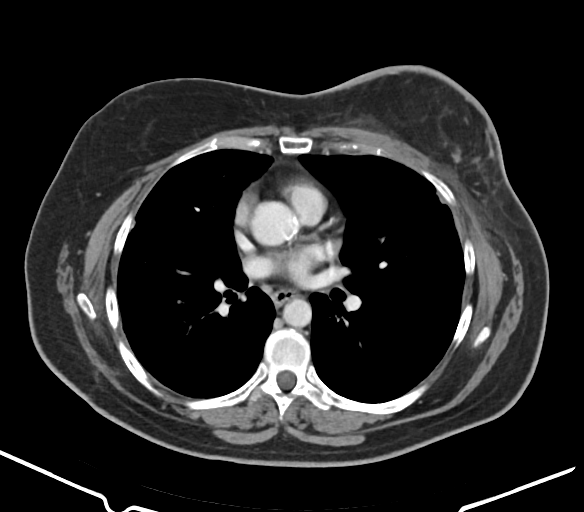
[im 100/119  soft-tissue]
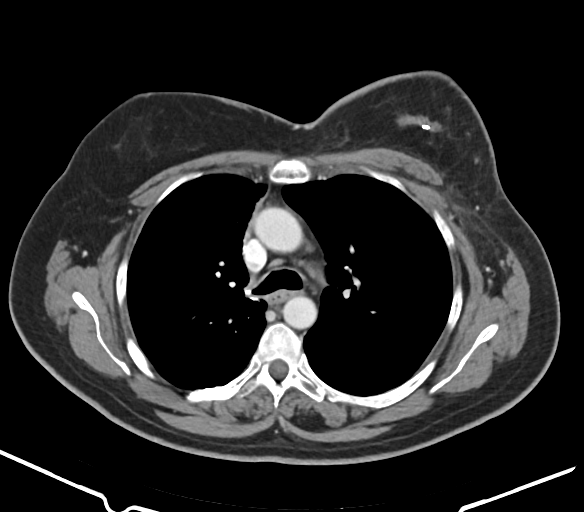
[im 100/119  bone]
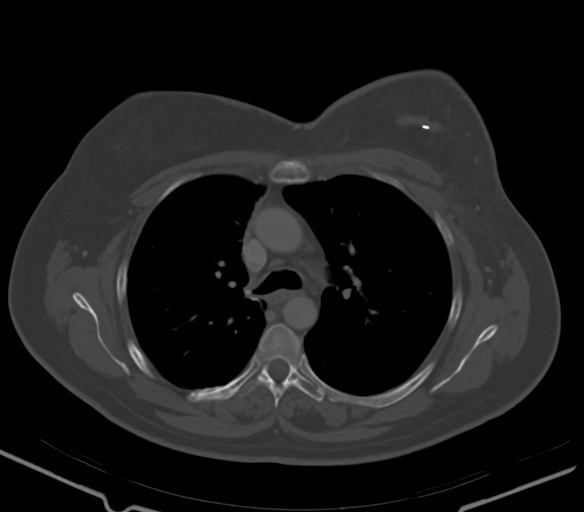
[im 109/119  soft-tissue]
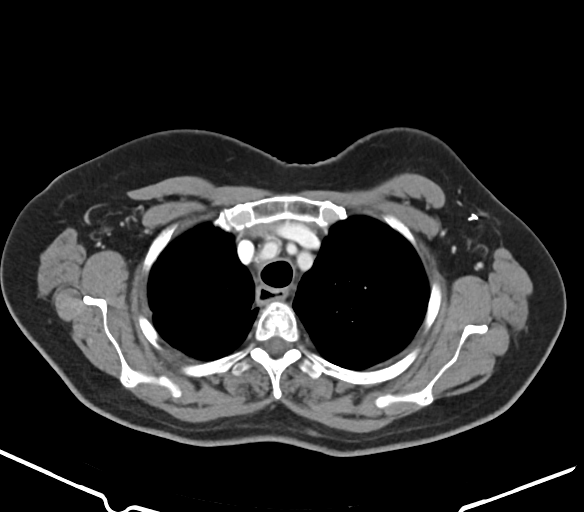

[Series 4: coronal · coronal · 0.77mm/px · 3 of 68 slices shown]
[im 23/68  soft-tissue]
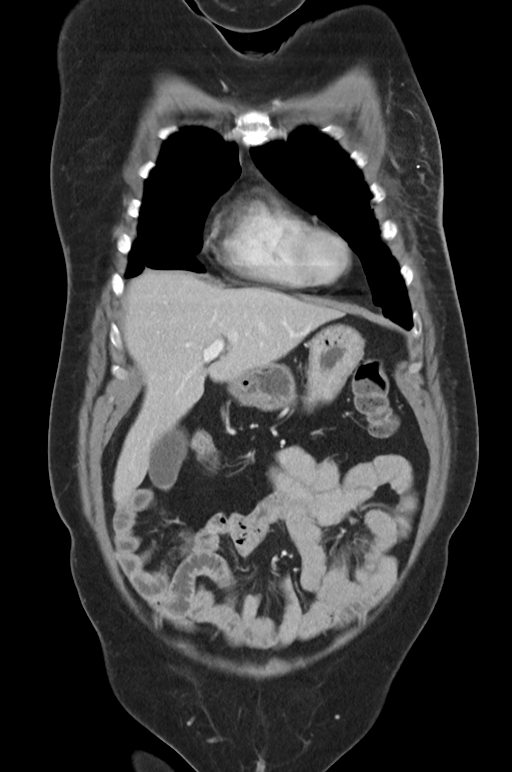
[im 30/68  soft-tissue]
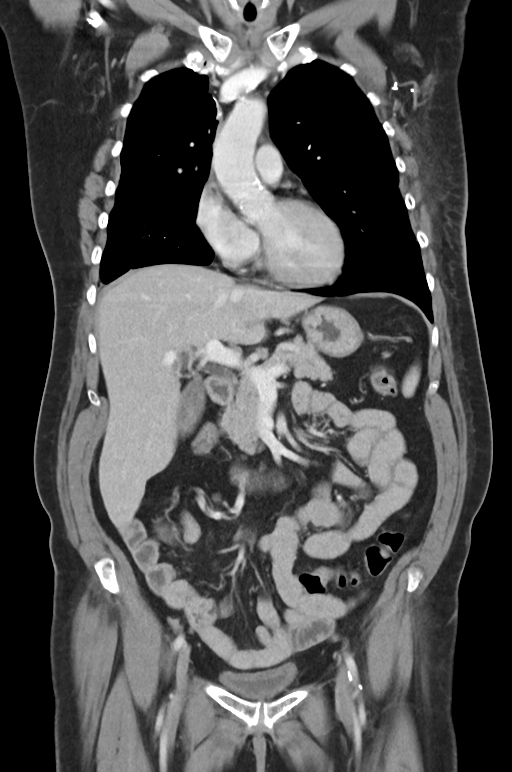
[im 38/68  soft-tissue]
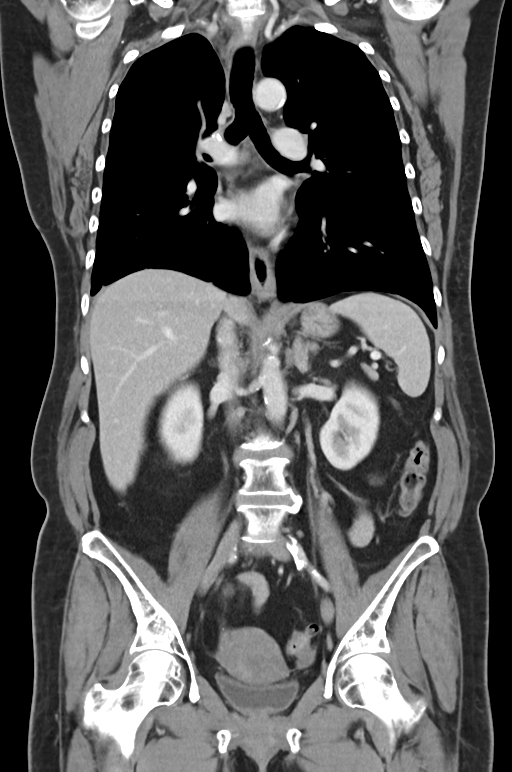

[13 of 46 positions shown; findings below may reference images not displayed]

FINDINGS: CT CHEST: Postoperative changes are seen in the left breast and axilla with increasing skin thickening over the left breast compared to 09/13/19 exam. Surgical clips at site of previous mass resection are noted and surgical change at the level of the resection has regressed compared to prior exam. No axillary or thoracic inlet lymphadenopathy is seen. Atheromatous calcifications are seen in the aorta. No mediastinal lymphadenopathy or mass is present. Heart size is normal. Small hiatal hernia is present.

Pulmonary scarring is seen in the right upper lobe status post partial right upper lobectomy with no evidence of recurrent bronchogenic carcinoma. Minimal subpleural scarring is seen in the left posterolateral lower chest which is new compared to prior exam and probably left as sequela of tangential radiation therapy. Upper lobe centrilobular pulmonary emphysema is seen in the left upper lobe. Minimal left apical scarring is stable. No pulmonary nodule, mass or pleural findings are present.

CT ABDOMEN: The liver, spleen, pancreas, adrenal glands and kidneys are normal excepting for stable small bilateral cortical renal cysts. Dense vascular calcifications are seen in the infrarenal abdominal aorta without aneurysm. There are numerous small air-containing gallstones in gallbladder along with additional mildly hyperdense gallstones in the dependent portion of the gallbladder showing no change from prior study. Stable small fat-containing umbilical hernia is seen.

CT PELVIS: The right colon including terminal ileum and appendix is normal. The remainder of colon is normal except for a few sigmoid diverticula. Normal atrophic uterus and adnexal structures are seen. Distal ureters, bladder and inguinal regions are normal. Coronal and sagittal reconstructions show no additional findings. No significant bone findings are noted other than old superior endplate L3 and L4 compression fractures which are unchanged from prior exam. No additional bone findings are present
IMPRESSION: 1. Postsurgical changes of left breast lumpectomy and axillary lymph node dissection with regressing surgical change and slight increase in skin thickening in the left breast from tangential radiation therapy. Examination shows no evidence of recurrent breast carcinoma.

2. Incidental findings include postop partial right upper lobectomy, centrilobular pulmonary emphysema in the left upper lobe, air-containing gallstones in gallbladder sigmoid diverticula, atheromatous vascular calcifications in the abdomen and pelvis, sigmoid diverticula and stable old compression fractures of superior endplate of L3 and L4 vertebrae.

## 2020-12-11 IMAGING — CT CT CHEST/ABD/PELVIS W CON
2 of 5 series · 11 of 46 positions shown, 12 images · non-contrast
Comparison: February 23, 2020

Images Obtained from [facility]
HISTORY: Neoplasm of breast, history of lung cancer.
TECHNIQUE: Axial images of the chest, abdomen and pelvis were obtained from the lung apices through the pubic symphysis following 100 ml of Dsovue-L71 intravenous contrast. Serum creatinine 0.5 mg/dL.

[Series 4: soft tissue · axial · 0.65mm/px · z∈[-1610,-1070]mm · 8 of 126 slices shown, 9 images]
[im 9/126  soft-tissue]
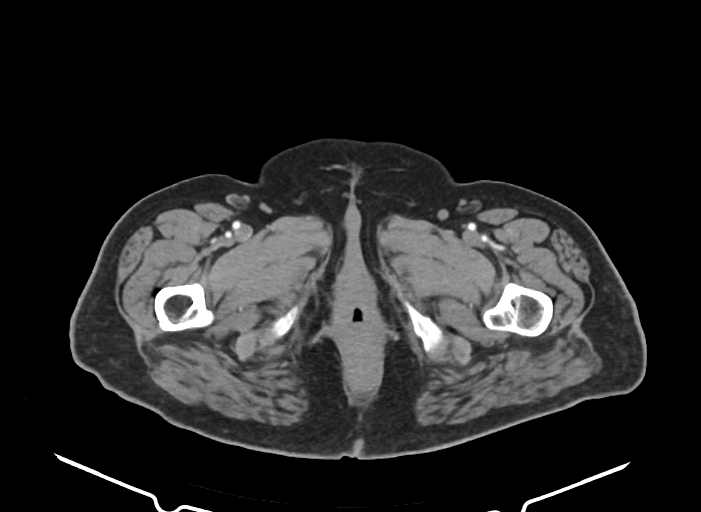
[im 9/126  bone]
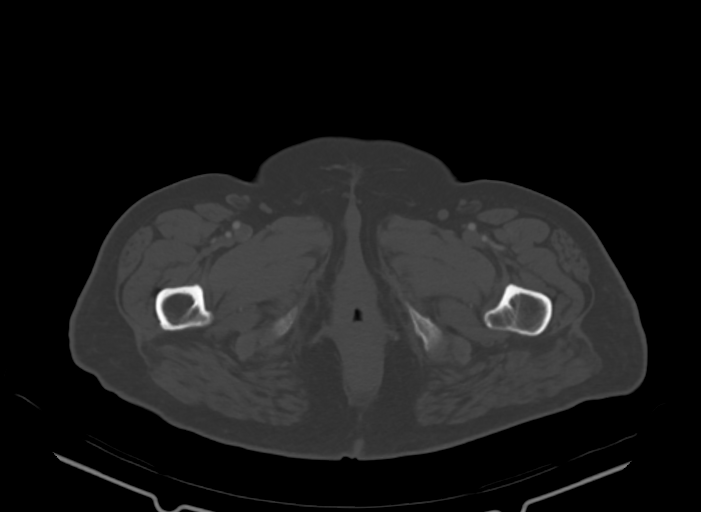
[im 27/126  soft-tissue]
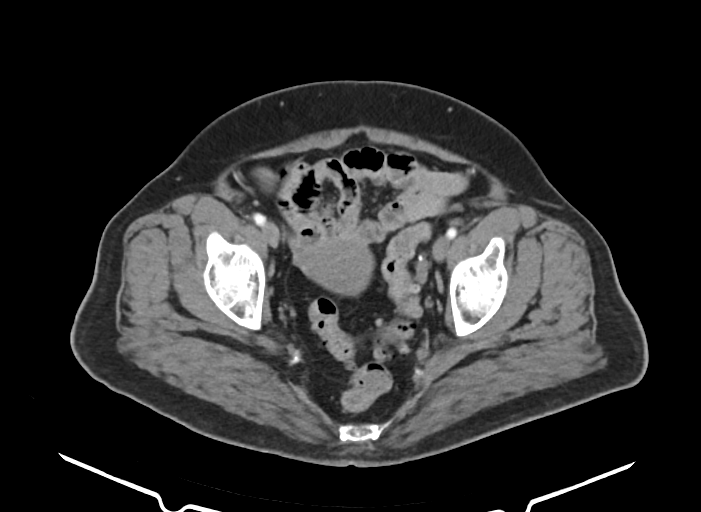
[im 45/126  soft-tissue]
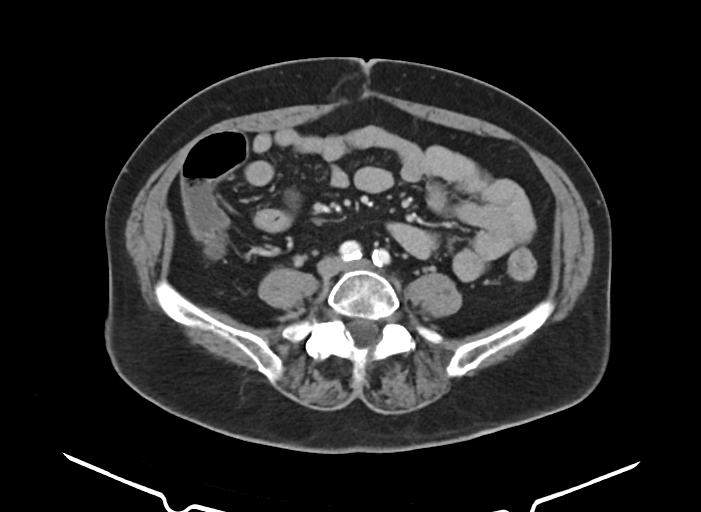
[im 54/126  soft-tissue]
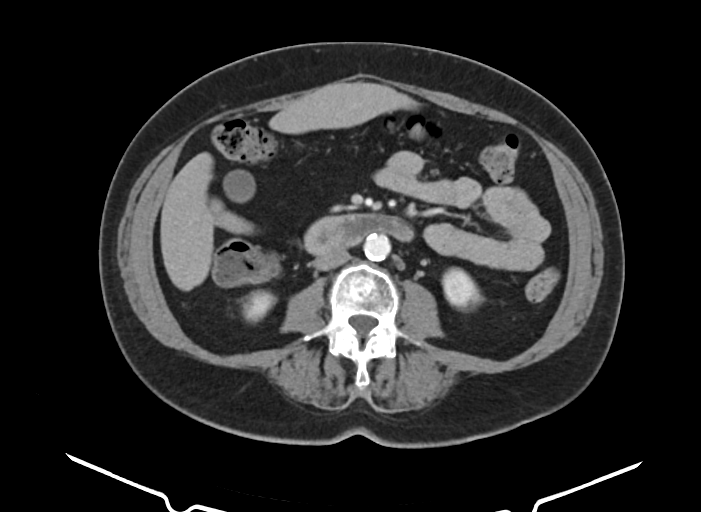
[im 72/126  soft-tissue]
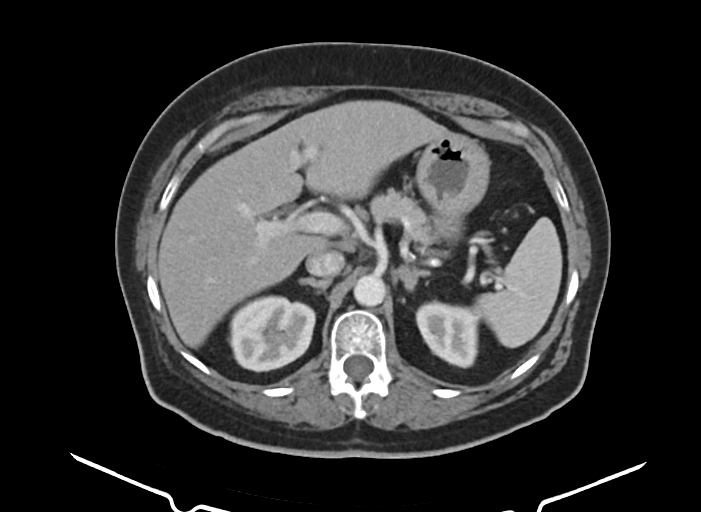
[im 81/126  soft-tissue]
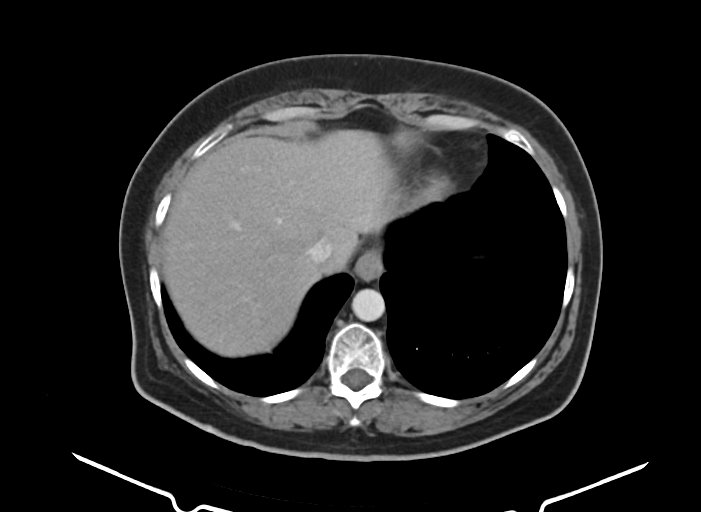
[im 99/126  soft-tissue]
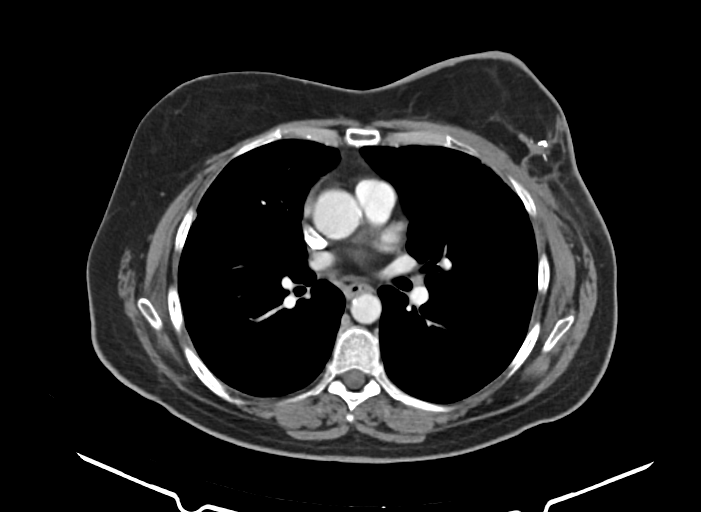
[im 117/126  soft-tissue]
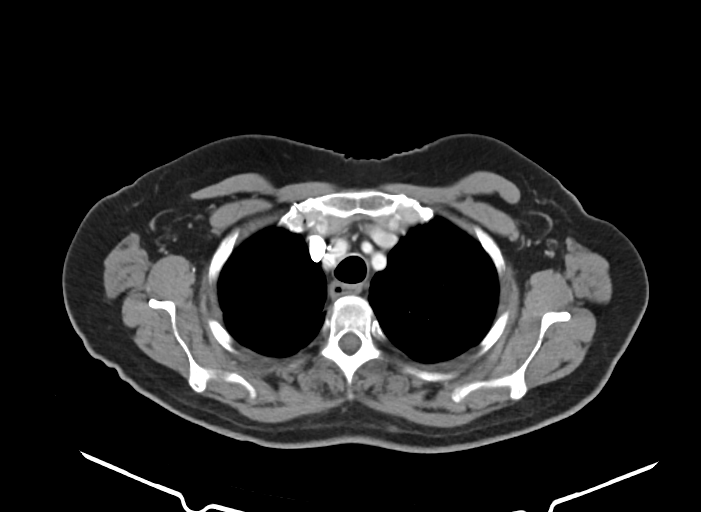

[Series 6: coronal · coronal · 0.89mm/px · 3 of 46 slices shown]
[im 16/46  soft-tissue]
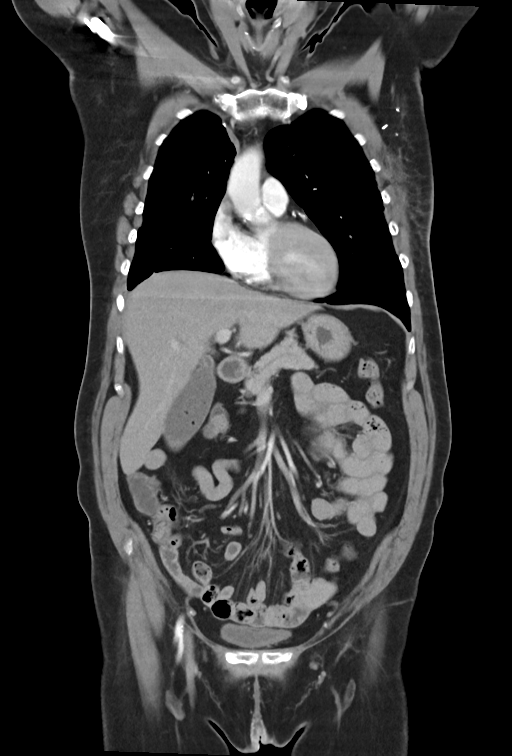
[im 21/46  soft-tissue]
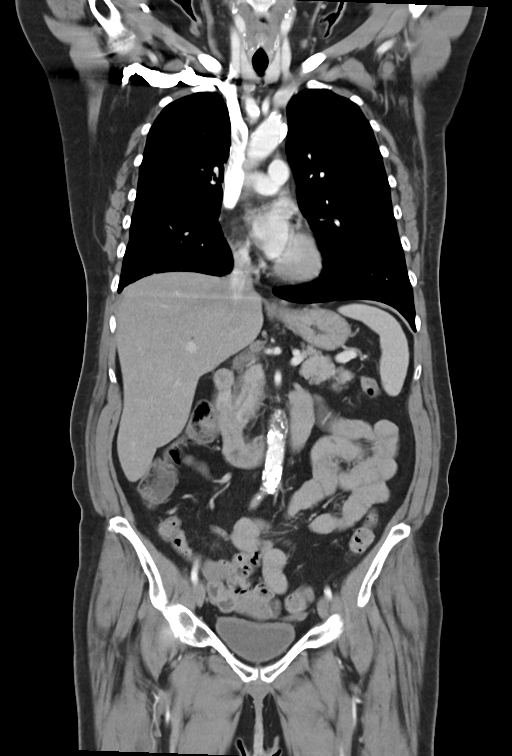
[im 26/46  soft-tissue]
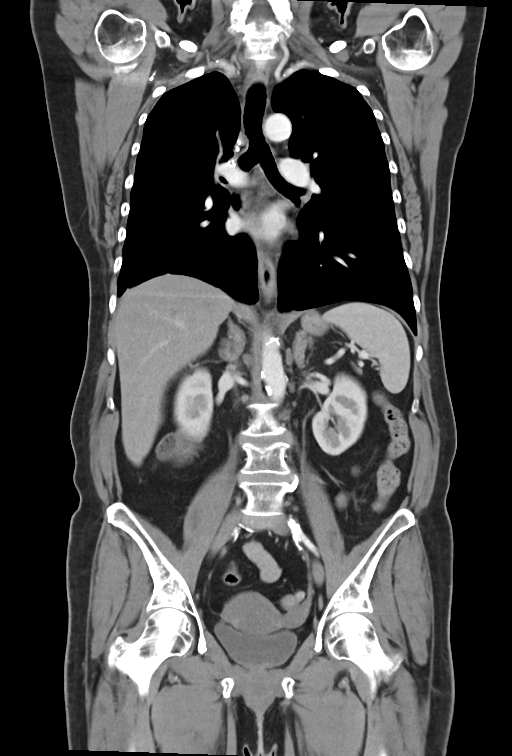

[11 of 46 positions shown; findings below may reference images not displayed]

FINDINGS: CHEST:
The central airways are patent. Postsurgical changes around the right suprahilar region are noted.
There is no focal parenchymal infiltrate or consolidation. Mild parenchymal volume loss is seen in the upper lungs with vague areas of hyperlucency noted. A few thin parenchymal strands are seen at
the bases. There are no suspicious pulmonary nodules or masses.
There are no pleural effusions.
The heart is normal in size without significant pericardial effusion. Some atherosclerotic vascular calcification is seen to the aortic arch.
There is no mediastinal, hilar, or axillary lymphadenopathy. Postsurgical changes are seen to the left axilla and breast area. There is no significant change to the postsurgical appearance of the
left breast compared to previous exam.
ABDOMEN AND PELVIS:
The liver and spleen enhance homogeneously without focal lesions. The gallbladder is well distended and contains some high density material layering in the lumen as well as some fat lucencies
suspended within the lumen consistent with cholesterol laden gallstones. The pancreas and adrenals are normal.
The kidneys uptake contrast without hydronephrosis or suspicious abnormality. A few cortical cysts are seen on the left. The retroperitoneum does not show any abnormally enlarged lymph nodes. There
is no evidence of iliac or inguinal adenopathy. Urinary bladder is moderately well-distended and grossly normal.
The uterus is unremarkable for age. Adnexal structures are unremarkable. Numerous phleboliths are seen bilaterally.
The GI tract is not obstructed. A few diverticula are scattered about the descending colon. There is no free fluid or extraluminal gas.
Musculoskeletal system shows anterior loss of height of T12 vertebral body. Central depression of the superior endplate is seen of the L3 and L4 vertebral bodies.
IMPRESSION: 1. Postsurgical changes left breast and axilla unchanged from previous exam. No CT evidence of any metastatic disease.
2. Mild panlobular emphysema. Status post probable right upper lobectomy.
3. Cholelithiasis.
4. Mild diverticulosis.
Total radiation dose to patient is CTDIvol 15.04 mGy and DLP 694.17 mGy-cm.

## 2021-03-16 ENCOUNTER — Encounter: Payer: Self-pay | Admitting: Hospice and Palliative Medicine

## 2021-03-16 ENCOUNTER — Inpatient Hospital Stay
Admission: RE | Admit: 2021-03-16 | Discharge: 2021-03-29 | DRG: 560 | Disposition: A | Discharge status: Home Health | Payer: Medicare Other | Source: Ambulatory Visit | Attending: Hospice and Palliative Medicine | Admitting: Hospice and Palliative Medicine

## 2021-03-16 DIAGNOSIS — R7401 Elevation of levels of liver transaminase levels: Secondary | ICD-10-CM | POA: Diagnosis not present

## 2021-03-16 DIAGNOSIS — Z720 Tobacco use: Secondary | ICD-10-CM

## 2021-03-16 DIAGNOSIS — Z79899 Other long term (current) drug therapy: Secondary | ICD-10-CM

## 2021-03-16 DIAGNOSIS — S32000A Wedge compression fracture of unspecified lumbar vertebra, initial encounter for closed fracture: Principal | ICD-10-CM | POA: Diagnosis present

## 2021-03-16 DIAGNOSIS — Z85118 Personal history of other malignant neoplasm of bronchus and lung: Secondary | ICD-10-CM

## 2021-03-16 DIAGNOSIS — Z853 Personal history of malignant neoplasm of breast: Secondary | ICD-10-CM

## 2021-03-16 DIAGNOSIS — E8809 Other disorders of plasma-protein metabolism, not elsewhere classified: Secondary | ICD-10-CM | POA: Diagnosis present

## 2021-03-16 DIAGNOSIS — E871 Hypo-osmolality and hyponatremia: Secondary | ICD-10-CM | POA: Diagnosis present

## 2021-03-16 DIAGNOSIS — J309 Allergic rhinitis, unspecified: Secondary | ICD-10-CM | POA: Diagnosis present

## 2021-03-16 DIAGNOSIS — X500XXD Overexertion from strenuous movement or load, subsequent encounter: Secondary | ICD-10-CM

## 2021-03-16 DIAGNOSIS — R059 Cough, unspecified: Secondary | ICD-10-CM

## 2021-03-16 DIAGNOSIS — T7840XD Allergy, unspecified, subsequent encounter: Secondary | ICD-10-CM

## 2021-03-16 DIAGNOSIS — K219 Gastro-esophageal reflux disease without esophagitis: Secondary | ICD-10-CM

## 2021-03-16 DIAGNOSIS — S32018D Other fracture of first lumbar vertebra, subsequent encounter for fracture with routine healing: Principal | ICD-10-CM

## 2021-03-16 DIAGNOSIS — E139 Other specified diabetes mellitus without complications: Secondary | ICD-10-CM

## 2021-03-16 DIAGNOSIS — Z87891 Personal history of nicotine dependence: Secondary | ICD-10-CM

## 2021-03-16 DIAGNOSIS — S22088D Other fracture of T11-T12 vertebra, subsequent encounter for fracture with routine healing: Secondary | ICD-10-CM

## 2021-03-16 DIAGNOSIS — E1165 Type 2 diabetes mellitus with hyperglycemia: Secondary | ICD-10-CM | POA: Diagnosis present

## 2021-03-16 DIAGNOSIS — J441 Chronic obstructive pulmonary disease with (acute) exacerbation: Secondary | ICD-10-CM | POA: Diagnosis not present

## 2021-03-16 DIAGNOSIS — I10 Essential (primary) hypertension: Secondary | ICD-10-CM | POA: Diagnosis present

## 2021-03-16 DIAGNOSIS — K5900 Constipation, unspecified: Secondary | ICD-10-CM | POA: Diagnosis not present

## 2021-03-16 DIAGNOSIS — E7800 Pure hypercholesterolemia, unspecified: Secondary | ICD-10-CM | POA: Diagnosis present

## 2021-03-16 DIAGNOSIS — E785 Hyperlipidemia, unspecified: Secondary | ICD-10-CM

## 2021-03-16 LAB — GLUCOSE WHOLE BLOOD - POCT: Whole Blood Glucose POCT: 192 mg/dL — ABNORMAL HIGH (ref 70–100)

## 2021-03-16 MED ORDER — INSULIN LISPRO 100 UNIT/ML SC SOLN
1.0000 [IU] | Freq: Three times a day (TID) | SUBCUTANEOUS | Status: DC
Start: 2021-03-17 — End: 2021-03-29
  Administered 2021-03-17 – 2021-03-18 (×2): 1 [IU] via SUBCUTANEOUS
  Administered 2021-03-21 – 2021-03-22 (×2): 2 [IU] via SUBCUTANEOUS
  Filled 2021-03-16 (×2): qty 6
  Filled 2021-03-16 (×2): qty 3

## 2021-03-16 MED ORDER — LISINOPRIL 10 MG PO TABS
10.0000 mg | ORAL_TABLET | Freq: Every day | ORAL | Status: DC
Start: 2021-03-17 — End: 2021-03-20
  Administered 2021-03-17 – 2021-03-20 (×4): 10 mg via ORAL
  Filled 2021-03-16 (×4): qty 1

## 2021-03-16 MED ORDER — LETROZOLE 2.5 MG PO TABS
2.5000 mg | ORAL_TABLET | Freq: Every day | ORAL | Status: DC
Start: 2021-03-17 — End: 2021-03-29
  Administered 2021-03-17 – 2021-03-29 (×13): 2.5 mg via ORAL
  Filled 2021-03-16 (×13): qty 1

## 2021-03-16 MED ORDER — GLUCAGON 1 MG IJ SOLR (WRAP)
1.0000 mg | INTRAMUSCULAR | Status: DC | PRN
Start: 2021-03-16 — End: 2021-03-29

## 2021-03-16 MED ORDER — VITAMIN D3 25 MCG (1000 UT) PO TABS
25.0000 ug | ORAL_TABLET | Freq: Every day | ORAL | Status: DC
Start: 2021-03-17 — End: 2021-03-29
  Administered 2021-03-17 – 2021-03-29 (×13): 25 ug via ORAL
  Filled 2021-03-16 (×13): qty 1

## 2021-03-16 MED ORDER — NICOTINE 14 MG/24HR TD PT24
1.0000 | MEDICATED_PATCH | Freq: Every day | TRANSDERMAL | Status: DC
Start: 2021-03-17 — End: 2021-03-29
  Administered 2021-03-17 – 2021-03-29 (×13): 1 via TRANSDERMAL
  Filled 2021-03-16 (×13): qty 1

## 2021-03-16 MED ORDER — ACETAMINOPHEN 500 MG PO TABS
1000.0000 mg | ORAL_TABLET | Freq: Four times a day (QID) | ORAL | Status: DC | PRN
Start: 2021-03-16 — End: 2021-03-29
  Administered 2021-03-17 – 2021-03-18 (×2): 1000 mg via ORAL
  Filled 2021-03-16 (×2): qty 2

## 2021-03-16 MED ORDER — DEXTROSE 10 % IV BOLUS
25.0000 g | INTRAVENOUS | Status: DC | PRN
Start: 2021-03-16 — End: 2021-03-29

## 2021-03-16 MED ORDER — OXYCODONE HCL 5 MG PO TABS
10.0000 mg | ORAL_TABLET | ORAL | Status: DC | PRN
Start: 2021-03-16 — End: 2021-03-29
  Administered 2021-03-17 – 2021-03-29 (×46): 10 mg via ORAL
  Filled 2021-03-16 (×45): qty 2

## 2021-03-16 MED ORDER — MELATONIN 3 MG PO TABS
3.0000 mg | ORAL_TABLET | Freq: Every evening | ORAL | Status: DC | PRN
Start: 2021-03-16 — End: 2021-03-29
  Filled 2021-03-16 (×2): qty 1

## 2021-03-16 MED ORDER — ONDANSETRON HCL 4 MG/2ML IJ SOLN
4.0000 mg | Freq: Three times a day (TID) | INTRAMUSCULAR | Status: DC | PRN
Start: 2021-03-16 — End: 2021-03-29

## 2021-03-16 MED ORDER — INSULIN LISPRO 100 UNIT/ML SC SOLN
1.0000 [IU] | Freq: Every evening | SUBCUTANEOUS | Status: DC
Start: 2021-03-16 — End: 2021-03-29
  Administered 2021-03-16 – 2021-03-20 (×5): 1 [IU] via SUBCUTANEOUS
  Filled 2021-03-16 (×5): qty 3

## 2021-03-16 MED ORDER — DEXTROSE 5% IV BOLUS
250.0000 mL | INTRAVENOUS | Status: DC | PRN
Start: 2021-03-16 — End: 2021-03-29

## 2021-03-16 MED ORDER — BISACODYL 10 MG RE SUPP
10.0000 mg | Freq: Every day | RECTAL | Status: DC | PRN
Start: 2021-03-16 — End: 2021-03-29
  Administered 2021-03-26 – 2021-03-27 (×2): 10 mg via RECTAL
  Filled 2021-03-16 (×2): qty 1

## 2021-03-16 MED ORDER — HEPARIN SODIUM (PORCINE) 5000 UNIT/ML IJ SOLN
5000.0000 [IU] | Freq: Three times a day (TID) | INTRAMUSCULAR | Status: DC
Start: 2021-03-16 — End: 2021-03-29
  Administered 2021-03-16 – 2021-03-29 (×38): 5000 [IU] via SUBCUTANEOUS
  Filled 2021-03-16 (×38): qty 1

## 2021-03-16 MED ORDER — OXYCODONE HCL 5 MG PO TABS
5.0000 mg | ORAL_TABLET | ORAL | Status: DC | PRN
Start: 2021-03-16 — End: 2021-03-29
  Administered 2021-03-16 – 2021-03-28 (×4): 5 mg via ORAL
  Filled 2021-03-16 (×7): qty 1

## 2021-03-16 MED ORDER — CALCIUM CITRATE-VITAMIN D 315-250 MG-UNIT PO TABS
1.0000 | ORAL_TABLET | Freq: Every day | ORAL | Status: DC
Start: 2021-03-17 — End: 2021-03-29
  Administered 2021-03-17 – 2021-03-28 (×12): 1 via ORAL
  Filled 2021-03-16 (×12): qty 1

## 2021-03-16 MED ORDER — DEXTROSE 50 % IV SOLN
25.0000 g | INTRAVENOUS | Status: DC | PRN
Start: 2021-03-16 — End: 2021-03-29

## 2021-03-16 MED ORDER — ROSUVASTATIN CALCIUM 10 MG PO TABS
40.0000 mg | ORAL_TABLET | Freq: Every evening | ORAL | Status: DC
Start: 2021-03-16 — End: 2021-03-19
  Administered 2021-03-16 – 2021-03-18 (×3): 40 mg via ORAL
  Filled 2021-03-16 (×3): qty 4

## 2021-03-16 MED ORDER — ONDANSETRON HCL 4 MG PO TABS
4.0000 mg | ORAL_TABLET | Freq: Three times a day (TID) | ORAL | Status: DC | PRN
Start: 2021-03-16 — End: 2021-03-29

## 2021-03-16 MED ORDER — INSULIN GLARGINE 100 UNIT/ML SC SOLN
10.0000 [IU] | Freq: Every evening | SUBCUTANEOUS | Status: DC
Start: 2021-03-16 — End: 2021-03-29
  Administered 2021-03-16 – 2021-03-28 (×13): 10 [IU] via SUBCUTANEOUS
  Filled 2021-03-16 (×13): qty 10

## 2021-03-16 MED ORDER — TIOTROPIUM BROMIDE MONOHYDRATE 2.5 MCG/ACT IN AERS
2.0000 | INHALATION_SPRAY | Freq: Every morning | RESPIRATORY_TRACT | Status: DC
Start: 2021-03-17 — End: 2021-03-29
  Administered 2021-03-17 – 2021-03-29 (×13): 2 via RESPIRATORY_TRACT
  Filled 2021-03-16 (×2): qty 1

## 2021-03-16 MED ORDER — METFORMIN HCL 500 MG PO TABS
500.0000 mg | ORAL_TABLET | Freq: Two times a day (BID) | ORAL | Status: DC
Start: 2021-03-17 — End: 2021-03-28
  Administered 2021-03-17 – 2021-03-28 (×23): 500 mg via ORAL
  Filled 2021-03-16 (×23): qty 1

## 2021-03-16 MED ORDER — FLEET ENEMA 7-19 GM/118ML RE ENEM
1.0000 | ENEMA | Freq: Every day | RECTAL | Status: DC | PRN
Start: 2021-03-16 — End: 2021-03-29

## 2021-03-16 NOTE — H&P (Addendum)
IRF Post-Admission Assessment  History and Physical    Reason for admission: Ambulation and activities of daily living dysfunction due to compression spine fractures L1 and T11.    Date of admission: 03/16/2021    PRECAUTIONS: Falls    History of present illness:   This 70 y.o. year old right hand-dominant female:    [name] is a 70 year-old female with past medical history significant for diabetes mellitus with hyperglycemia, hypertension hypercholesterolemia, COPD, lung cancer, breast cancer, nicotine dependence who presents to the [facility] ED with c/o right-sided lower back pain that has been ongoing since April. Patient mentioned that she incurred an injury to her back when she tries to pick up a heavy suitcase sometime in April. She was diagnosed with Lumbar sprain by then. Patient came to the ED today because she was having severe pain that she could not tolerate. She mentioned that she has been taking Tylenol and Motrin and a muscle relaxant that was prescribed by her PCP but has stopped taking the muscle relaxant because she is supposed to take it food and has been eating much. Per attending MRI of the lumbar spine showed degenerative changes. There are multiple vertebral bodies with abnormal edema within enhancement including T10, T11, L1, and L2 vertebral bodies. There is evidence for a superior endplate fracture involving L1 vertebral body with approximately 15% vertebral body height loss. There is also a superior endplate fracture involving T11. No large associated soft tissue mass or abnormal enhancement is identified in the remaining vertebral bodies. The findings could represent multiple acute osteopenic compression fractures. Although less likely pathologic fractures cannot be excluded, especially involving the L1 vertebral body.  On admission to the hospital she was had hyponatremia which has since resolved. She is from [place] and is in [place] visiting her daughter. She lives in a 1 level  home with 1 step to enter and no stairs inside. Prior to her recent decline she was independent for all ADLs and mobility    Past Medical History:   Past Medical History:   Diagnosis Date    Breast cancer     COPD (chronic obstructive pulmonary disease)     Diabetes mellitus     Hypertension     Lung cancer        Allergies:   Allergies   Allergen Reactions    Erythromycin Itching and Rash           Family History: No family history on file.    Functional history and social history:    Prior to admission, patient was independent with her ambulation and ADLs.  Marital Status: divorced  Does patient have children?: Yes   How many children does the patient have?: 3  Where do the children reside?: 2 in [place] near patient's home and 1 Local   Employment status: Retired  Recreational activities/hobbies: socializing  Pre-hospital living environment: Lives in a 1 level home with 1 step to enter and no stairs inside      Current functional status at the time of admission to acute inpatient rehabilitation:  Weight-bearing status:  no restrictions     Mobility  Rolling: Contact guard/minimal assistance  Supine to sit: Contact guard/minimal assistance  Sit to stand: Contact guard/minimal assistance  Transfers out of bed: Contact guard/minimal assistance  Transfers to toilet: Contact guard/minimal assistance  Ambulation: Contact guard/minimal assistance  Ambulation Distance: 60 feet  Assistive device used: [employer]  Pattern: pts gait is unsteady and slow, pt also fatigues  early     Activities of Daily Living: requires overall min to mod A with self care per OT 03/14/2021    Review of systems:  Pertinent positives are:  pain in back, generalized weakness, c/o constipation.   A full review of systems was obtained and was otherwise negative.    Medications:      Scheduled Meds: PRN Meds:        Continuous Infusions:         Medication Review  A complete drug regimen review was completed: Yes  Were any drug issues found during review?:  No         Physical Examination:   There were no vitals taken for this visit.  Estimated body mass index is 28.29 kg/m as calculated from the following:    Height as of an earlier encounter on 03/16/21: 1.651 m (5\' 5" ).    Weight as of an earlier encounter on 03/16/21: 77.1 kg (170 lb).    General appearance: Appears well.  In no acute distress. Normal body habitus. Appears stated age.  Eyes:Conjunctivae non-injected.  HENT:Symmetric facies. Moist mucous membranes.  CV:+S1S2  Pulm:Lungs are clear to auscultation bilaterally.  WFU:XNAT. Non-tender. Normoactive bowel sounds.  Skin:No visible rashes or breakdown.  Neuro: Speech fluent and appropriate..      Manual muscle strength testing:  Muscle group Right Left   Shoulder abductors 4 4   Elbow flexors 5 5   Elbow extensors 5 5   Wrist extensors 5 5   Middle finger DIP flexors 5 5   Little finger abductors 5 5   Hip flexors 4 4   Knee extensors 5 5   Ankle dorsiflexors 5 5   Ankle plantarflexors 5 5   Great toe extensors 5 5     Psych: Alert Pleasant and cooperative Oriented x 3    INPATIENT REHABILITATION FACILITY - PATIENT ASSESSMENT INSTRUMENT QUALITY INDICATORS       Section C.  Cognitive Patterns.    C0100. Should Brief Interview for Mental Status (C0200-C0500) be conducted? (3-day assessment period) Attempt to conduct interview with all patients.      0. No (patient is rarely/never understood) Skip to C0900. Memory/Recall Ability   1. Yes Continue to C0200. Repetition of Three Words   Yes     Brief Interview for Mental Status (BIMS)    C0200. Repetition of Three Words      Ask patient: "I am going to say three words for you to remember. Please repeat the words after I have said all three. The words are: sock, blue and bed. Now tell me the three words."    Number of words repeated by patient after first attempt:   3. Three   2. Two   1. One   0. None     Three    After the patient's first attempt say: "I will repeat each of the three words with a cue and ask you  about them later: sock, something to wear; blue, a color; bed, a piece of furniture." You may repeat the words up to two more times             Brief Interview for Mental Status (BIMS) - Continued    C0300. Temporal Orientation: Year, Month, Day      A. Ask patient: "Please tell me what year it is right now." Patient's answer is:   3. Correct   2. Missed by 1 year   1. Missed by 2 to  5 years   0. Missed by more than 5 years or no answer     Correct       B. Ask patient: "What month are we in right now?" Patient's answer is:   2. Accurate within 5 days   1. Missed by 6 days to 1 month  0. Missed by more than 1 month or no answer     Accurate within 5 days       C. Ask patient: "What day of the week is today?" Patient's answer is:   1. Correct   0. Incorrect or no answer     Correct     C0400. Recall    Ask patient: "Let's go back to the first question. What were those three words that I asked you to repeat?" If unable to remember a word, give cue (i.e., something to wear; a color; a piece of furniture) for that word.     A. Recalls "sock?"   2. Yes, no cue required  1. Yes, after cueing ("something to wear")  0. No, could not recall     Yes, no cue required     B. Recalls "blue?"   2. Yes, no cue required   1. Yes, after cueing ("a color")  0. No, could not recall     Yes, no cue required     C. Recalls "bed?"  2. Yes, no cue required   1. Yes, after cueing ("a piece of furniture")   0. No, could not recall     Yes, no cue required   C0500. BIMS Summary Score.      Select "Yes", if the patient was unable to complete the interview.  Select "No", if the patient was able to complete the interview.   No     C0600. Should the Staff Assessment for Mental Status [phone]) be Conducted?      0. No (patient was able to complete Brief Interview for Mental Status)   1. Yes (patient was unable to complete Brief Interview for Mental Status) Continue to C0900. Memory/Recall Ability     No (patient was able to complete Brief  Interview for Mental Status)                                      Labs:   No results for input(s): GLUCOSEWHOLE in the last 24 hours.                       Results       ** No results found for the last 24 hours. **               Radiology: all results from this admission  No results found.      ASSESSMENT:     Ambulation and activities of daily living dysfunction  L1 and T11 compression fractures    COPD    Hypertension    Hypercholesterolemia    Diabetes mellitus    History of lung cancer    History of breast cancer    History of nicotine dependence    DVT prophylaxis: Ambulation and heparin 5000 units subcu every 8 hours    There is no problem list on file for this patient.      [x]  Requires an intensive inpatient rehabilitation program with multidisciplinary therapies, rehab nursing, and close physician management.    [x]  The  following co-morbidities may complicate rehabilitation:  Hypoalbuminemia    [x]  Is at risk for the following complications:  Injurious falls, Pneumonia, Urinary tract infection, Venous thromboembolic disease, Arrhythmia, and Myocardial infarction        Individualized Plan of Care:    - REHAB: Begin comprehensive and intensive inpatient rehab program, including:  Neuropsychology consult as needed;  Physical therapy 60-120 min daily, 5-6 times per week for 14 days, Occupational therapy 60-120 min daily, 5-6 times per week for 14 days, Therapeutic recreation, Case management, and Rehabilitation nursing    Will work in an interdisciplinary manner to address the following impairments and issues:   Mobility, ADLs, Cognitive impairments, Impaired strength, Impaired ROM, Impaired endurance, Adherence to precautions, Caregiver training, Medication management, Adjustment to disability, Leisure skills, Community support and resources, Impaired coordination, Impaired balance, and Coping strategies    Requires 24h rehabilitation nursing to address:  Medication teaching    Anticipate a discharge  to:  Home with assistance    Estimated length of stay: 14 days      Goals for discharge:  Bed mobility Modified independent with LRAD   Transfers Modified independent with LRAD   Locomotion Modified independent with LRAD   Upper body dressing Modified independent with LRAD   Lower body dressing Modified independent with LRAD   Bathing Modified independent with LRAD   Toileting Modified independent with LRAD   Communication Use compensatory strategies to express needs and wants     Swallow Tolerate least restrictive oral diet without signs or symptoms of aspiration     Cognition Use compensatory strategies appropriately to compensate       Rehab potential: fair    Prognosis: fair    Potential limitations:Challenging home environment and Limited caregiver support      Review of Pre-admission assessment  [x]  I have reviewed the nurse liaison's pre-admission assessment in [employer].   I do not note any significant changes at this time and agree with patient's appropriateness for intensive inpatient rehab program.      Signed by: Virl Cagey, MD    [facility]      If there are questions or concerns about the content of this note or information contained within the body of this dictation they should be addressed directly with the author for clarification.

## 2021-03-16 NOTE — Preadmission Screening Note (Signed)
Physical Medicine and Rehabilitation  Clinical Liaison Preadmission Screening    Patient Name:  [name] [name]       Medical Record Number: [id number]     Probable Impairment Group Code: [id number] - Debility (Non-Cardiac, Non-Pulmonary)    Demographics  Date of Birth: 18-Jun-1951  Age: 70 y.o.  Sex: Female   Contact Person: [name] [name] Daughter [phone]    Past Medical History:   Diagnosis Date    Breast cancer     COPD (chronic obstructive pulmonary disease)     Diabetes mellitus     Hypertension     Lung cancer        Past Surgical History:   Procedure Laterality Date    BREAST LUMPECTOMY         Prior Level of Functioning:  Mobility  Rolling: Independent (without device)  Supine to sit: Independent (without device)  Sit to stand: Independent (without device)  Transfers out of bed: Independent (without device)  Transfers to toilet: Independent (without device)  Ambulation: Independent (without device)    Activities of Daily Living  Eating: Independent (without device)  Grooming: Independent (without device)  Bathing upper body: Independent (without device)  Bathing lower body: Independent (without device)  Dressing upper body: Independent (without device)  Dressing lower body: Independent (without device)  Toileting: Independent (without device)    CARE Tool Items:   Self-Care: Independent  Indoor Mobility (Ambulation): Independent  Stairs: Independent  Cognition: Independent  Devices: None of the given options  Swallowing/Nutritional Status: Regular food  Has the patient had two or more falls in the past year or any falls with injury in the past year?: No  Did the patient have major surgery during the 100 days prior to admission?: No    Social History  Marital Status: divorced  Does patient have children?: Yes   How many children does the patient have?: 3  Where do the children reside?: 2 in [place] near patient's home and 1 Local   Employment status: Retired  Recreational activities/hobbies:  socializing  Pre-hospital living environment: Lives in a 1 level home with 1 step to enter and no stairs inside  Language: English  Hand dominance: right      The following information was gathered for consideration and maintenance in the medical record to substantiate medical necessity for an IRF level of care.    This assessment is being completed by [name] Abbot, RN. [name] [name] is currently at [name] [name] [phone]. [name] [name] is being referred and recommended by their physician,   [name] [name], to be assessed both medically and functionally in regard to their premorbid functional capacity to determine whether they can benefit from a rehabilitation level of care offered by our facility. The following is information regarding the medical complexity and clinical risk factors that need to be considered for the appropriate management of [name] [name]'s care and recovery.     Impairment Group Code: 0016 - Debility (Non-Cardiac, Non-Pulmonary)    Etiologic Diagnoses:   - S32.010A - Wedge compression fracture of first lumbar vertebra, initial encounter for closed fracture   - S22.080A - Wedge compression fracture of T11-T12 vertebra, initial encounter for closed fracture      Comorbid Conditions: Diabetes mellitus, Hypertension, and Pain    History of Present Illness: [name] [name] is a 70 year-old female with past medical history significant for hypertension hypercholesterolemia, COPD, diabetes, lung cancer, breast cancer, nicotine dependence who presents to the [name] with  c/o right-sided lower back pain that has been ongoing since April. Patient mentioned that she incurred an injury to her back when she tries to pick up a heavy suitcase sometime in April. She was diagnosed with Lumbar sprain by then. Patient came to the [name] today because she was having severe pain that she could not tolerate. She mentioned that she has been taking Tylenol and Motrin and a  muscle relaxant that was prescribed by her PCP but has stopped taking the muscle relaxant because she is supposed to take it food and has been eating much. Per attending MRI of the lumbar spine showed degenerative changes. There are multiple vertebral bodies with abnormal edema within enhancement including T10, T11, L1, and L2 vertebral bodies. There is evidence for a superior endplate fracture involving L1 vertebral body with approximately 15% vertebral body height loss. There is also a superior endplate fracture involving T11. No large associated soft tissue mass or abnormal enhancement is identified in the remaining vertebral bodies. The findings could represent multiple acute osteopenic compression fractures. Although less likely pathologic fractures cannot be excluded, especially involving the L1 vertebral body.  On admission to the hospital she was had hyponatremia which has since resolved. She is from [place] and is in [place] visiting her daughter. She lives in a 1 level home with 1 step to enter and no stairs inside. Prior to her recent decline she was independent for all ADLs and mobility. Currently she is min a to CG for bed mobility and transfers. She is waliking 60 feet min a. She is min  a to mod a for "self care" per OT. The patient has been working well with therapy services to include PT and OT to address functional mobility, self-care deficit, and pain.  The patient would benefit from an intensive level of rehab along with close medical management of multiple medical conditions and rehab MD to drive the rehab process.  The patient is medically stable for IRF level of care.  Date of Onset: 03/11/2021  Date Admitted to Acute: 03/11/2021  Current Precautions: spinal precautions  Allergies:   Allergies   Allergen Reactions    Erythromycin Itching and Rash       Present Symptoms Summary  Vitals  BP: 144/65 (03/16/2021 12:55 PM)  Temp: 98.6 F (37 C) (03/16/2021 12:55 PM)  Heart Rate: 95 (03/16/2021 12:55  PM)  Resp Rate: 18 (03/16/2021 12:55 PM)  Height: 1.651 m (5\' 5" ) (03/16/2021 12:55 PM)  Weight: 77.1 kg (170 lb) (03/16/2021 12:55 PM)    Is patient hard of hearing: No  Hearing aid usage: no hearing aids  Does patient have vision issues: wears glasses  Other medical comments: na    Diet Consistency: Regular  Diet Type: Regular  Liquid Consistency: Thin  Bladder: Continent  Last Bowel Movement: 03/13/2021  Bowel: Continent  Integumentary: No skin issues  Cardiopulmonary: Room air  Dialysis: No    Medication: acetaminophen 325 mg Tabs  Take 2 Tabs by Mouth Every 4 Hours As Needed for Mild Pain (Pain Score 1-3), Moderate Pain (Pain Score 4-6), Severe Pain (Pain Score 7-10) or Fever (fever GREATER than 100.5 F).  Refills: 0  Commonly known as: TylenoL     calcium carbonate 200 mg calcium (500 mg) Chew  Take 500 mg by Mouth twice daily before meals.  Refills: 0  Commonly known as: Tums     cholecalciferol 25 mcg (1,000 unit) Tabs  Take 5 Tabs by Mouth Once a Day.  Refills: 0  Commonly known as: Vitamin D3     insulin glargine 100 unit/mL Soln  Inject 10 Units beneath the skin Every Night at Bedtime.  Refills: 0  Commonly known as: Lantus vial  Replaces: Lantus Solostar U-100 Insulin 100 unit/mL (3 mL) insulin pen     insulin LISPRO 100 unit/mL Soln  Inject 1-6 Units beneath the skin 4 Times a Day Before Meals & at Bedtime.  Refills: 0  Commonly known as: HumaLOG vial     nicotine 21 mg/24 hr Pt24  Apply 1 Patch as directed Every 24 Hours.  Refills: 0  Commonly known as: Nicoderm CQ     senna-docusate 8.6-50 mg Tabs  Take 1 Tab by Mouth Twice Daily.  Refills: 0  Commonly known as: Senokot-S        CONTINUE taking these medications   Farxiga 10 mg Tabs  Take 10 mg by Mouth Once a Day.  Refills: 0  Generic drug: dapagliflozin     letrozole 2.5 mg Tabs  Take 2.5 mg by Mouth Once a Day.  Refills: 0  Commonly known as: Femara     lisinopriL 10 mg Tabs  Take 10 mg by Mouth Once a Day.  Refills: 0  Commonly known as: PriniviL      metFORMIN ER 500 mg Tb24 24 hour tablet  Take 2 Tabs by Mouth Twice Daily.  Refills: 0  Commonly known as: Glucophage ER     rosuvastatin 40 mg Tabs  Take 1 Tab by Mouth Once a Day.  Refills: 0  Commonly known as: Crestor     Spiriva Respimat 2.5 mcg/actuation Mist  2 PUFF(S) MIST INHALATION EVERY DAY  Refills: 0  Generic drug: tiotropium bromide      Alcohol/Tobacco/Drug Use:  [name] has no history on file for alcohol use. [name] has no history on file for tobacco use. [name] has no history on file for drug use.    Pain: Yes; acute  Plan: Po medication    Lab Results (past 2 days):   03/11/21   WBC x 10*3  7.9  RBC x 10^6  4.12  HGB  13.0  HCT  39.1  Platelet  298  Potassium  3.4 (L)  Sodium  135  Chloride  94 (L)  Glucose  163 (H)  Calcium  9.3  BUN  8  Creatinine  0.3 (L)  CO2  27        Radiology (past 7 days):  CT ABD/PELVIS-NO ORAL/IV     Narrative     PROCEDURE: CT ABDOMEN AND PELVIS WITHOUT CONTRAST     TECHNIQUE:  Computerized axial tomography of the abdomen and pelvis was performed without intravenous iodinated contrast. The sensitivity of a CT examination for some abdominal and pelvic pathologic conditions, including neoplasms, inflammation, abscess, vascular occlusion, arterial dissection and infarction, is reduced without intravenous contrast. Techniques to minimize radiation exposure, such as automated exposure control, adjustment of mA and/or kV according to patient size, or iterative reconstruction, are utilized, when appropriate, to reduce radiation dose to as low as reasonably achievable. CPT [id number], [phone]     HISTORY: Abd pain, acute, generalized     COMPARISONS: None.     FINDINGS:     No acute process identified in the lung bases.     Liver is normal in size and contour with no suspicious mass identified.     Evidence of cholelithiasis with no gallbladder wall thickening or pericholecystic edema.     Spleen is  normal size.     Pancreas and pancreatic duct are  normal.     Adrenal glands are mildly thickened left greater than right.     No nephrolithiasis or hydronephrosis identified. A few hypodense cysts in the left kidney measuring up to 1.7 cm.     No bowel obstruction, free air or pneumatosis. Circumferential wall thickening of the visualized distal esophagus. No bowel wall edema or thickening identified. Colonic diverticulosis. Appendix is normal.     Small ventral abdominal wall hernia containing fat and the right of the umbilicus measuring 1.7 cm.     Urinary bladder appears normal. No suspicious pelvic mass identified.     No ascites or bulky lymphadenopathy. Severe atherosclerotic disease.     Mild superior endplate compression fracture deformities identified at T11, T12, L1, L2, L3 and possibly L4. Also appears to be a likely inferior endplate compression fracture partially visualized at T10. No significant bony retropulsion identified at any level.        Impression        1. Multilevel compression fracture deformities in the visualized lower thoracic and lumbar spine which are age indeterminate and possibly acute.  2. Cholelithiasis.  3. Colonic diverticulosis.  5. Circumferential wall thickening in the visualized distal esophagus, correlate clinically and consider follow-up endoscopy.     Signed By: Jannifer Hick, MD on 03/11/2021 2:13 PM     CHEST PORTABLE     Narrative     PROCEDURE: PORTABLE CHEST     TECHNIQUE: A portable AP chest radiograph was obtained at 03/11/2021 12:45 PM. CPT 71045     HISTORY: CHEST PAIN     COMPARISONS: None.     FINDINGS:       Cardiomediastinal silhouette is within normal limits. Pulmonary vasculature appears normal.     No focal consolidation identified. Mild chronic appearing changes at the right lung base. Surgical clips overlying the right mediastinum and left lower chest.     No significant pleural effusion. No pneumothorax.        Impression         No acute intrathoracic process identified.     Signed By: Jannifer Hick, MD on 03/11/2021 1:06 PM    IVs: No IV access    Patient is presently participating in rehab.    Adjustment to Present Illness: patient is coping adequately, patient is accepting limitations adequately, patient's expectations are realistic, and patient is motivated    Activity Tolerance: good    Special needs: Spinal precautions TSLO brace   Are you currently experiencing fever and/or symptoms of acute respiratory  illness (such as cough, difficulty breathing)? NO  Do you have a history of travel from affected geographic areas within 14 days of  symptom onset? NO  Did you have close contact with a laboratory confirmed COVID-19 person? NO  Does the person reside in a nursing home or long-term care facility or assisted  living facility? NO  Was the patient ever tested for COVID? YES  Document COVID test results/date: Negative on 03/11/2021    Current Functional Status  Weight-bearing status:  no restrictions    Mobility  Rolling: Contact guard/minimal assistance  Supine to sit: Contact guard/minimal assistance  Sit to stand: Contact guard/minimal assistance  Transfers out of bed: Contact guard/minimal assistance  Transfers to toilet: Contact guard/minimal assistance  Ambulation: Contact guard/minimal assistance  Ambulation Distance: 60 feet  Assistive device used: [employer]  Pattern: pts gait is unsteady and slow, pt also fatigues early  Activities of Daily Living: requires overall min to mod A with self care per OT 03/14/2021  Eating: Not tested  Grooming: Not tested  Bathing upper body: Not tested  Bathing lower body: Not tested  Dressing upper body: Not tested  Dressing lower body: Not tested  Toileting: Not tested    Cognition: Alert/oriented  Cognitive deficits: None noted at this time  Communication deficits: None noted at this time  Swallowing deficits: No  Other impairments: Gait abnormality    Healthcare Decisions:  Patient is able to understand and make healthcare decisions: Yes    Payer Source  Primary:  Medicare A  Secondary: Commercial    Rehab risks/benefits reviewed: Yes  Patient/family/caregiver agrees/accepts rehab risks/benefits: Yes  Rehab literature/brochure provided: No    Is the patient being considered for an arthritic condition to meet the 60% regulatory requirement: No      Is the patient appropriate for IRF admission (able to tolerate intensive inpatient rehabilitation and has a condition that requires a comprehensive interdisciplinary team)?  Yes   Veria Cuff's condition is sufficiently stable to allow active participation in an intensive interdisciplinary inpatient rehabilitation program. Lauran Romanski would benefit from interdisciplinary inpatient rehabilitation provided by a physician, rehab-focused nursing, and a minimum of two rehab therapies which will provide specialized care for the following functional deficits: leisure skills, mobility, safety risk, and self-care management    The recommended interdisciplinary team will be comprised of the following services: Medical Supervision, 24 Hour Rehabilitation Nursing, Physical Therapy, Occupational Therapy, and Therapeutic Recreation    Bev Drennen is able to tolerate 3 hours of therapy 5 days a week.    Prognosis: good    Level of expected improvement with inpatient rehabilitation stay: The patient will likely progress with course of intensive inpatient acute rehabilitation and be discharged to home with assistance from spouse and/or family members.  It is anticipated the patient will achieve level of mod I with least restrictive device with supervision.  The patient will require intensive occupational therapy and physical therapy to work on bed mobility, transfers, gait, strengthening, range of motion, balance, endurance, coordination, basic ADLs, and safety  The patient will have 24 hour rehab nursing to monitor vital signs, administer medications and education, skin/wound infection prevention, pain control, bowel and bladder,  bladder scans if necessary, fall prevention, and reinforcement of therapy strategies.  Patient will have case management for discharge planning and team coordination.  Patient will have registered dietician for weight management as needed.  Patient's family will be scheduled for family/patient education.  Patient will have therapeutic recreation for community re-entry and adaptive equipment.  Patient will have frequent visits by physiatry who will assess, plan, implement, and evaluate the individualized rehabilitation program for this patient.       Estimated date of admission to inpatient rehabilitation: 03/16/2021    Estimated length of inpatient rehabilitation stay in order to achieve rehab medical/functional goals: 10 days    Anticipated destination post-discharge from inpatient rehabilitation: community discharge with assistance; home health therapy and home health nursing

## 2021-03-17 ENCOUNTER — Other Ambulatory Visit: Payer: Self-pay

## 2021-03-17 ENCOUNTER — Encounter: Payer: Self-pay | Admitting: Hospice and Palliative Medicine

## 2021-03-17 LAB — COMPREHENSIVE METABOLIC PANEL
ALT: 25 U/L (ref 0–55)
AST (SGOT): 23 U/L (ref 5–34)
Albumin/Globulin Ratio: 1.5 (ref 0.9–2.2)
Albumin: 3.1 g/dL — ABNORMAL LOW (ref 3.5–5.0)
Alkaline Phosphatase: 91 U/L (ref 37–117)
Anion Gap: 9 (ref 5.0–15.0)
BUN: 9 mg/dL (ref 7.0–19.0)
Bilirubin, Total: 0.3 mg/dL (ref 0.2–1.2)
CO2: 27 mEq/L (ref 22–29)
Calcium: 8.8 mg/dL (ref 8.5–10.5)
Chloride: 96 mEq/L — ABNORMAL LOW (ref 100–111)
Creatinine: 0.5 mg/dL — ABNORMAL LOW (ref 0.6–1.0)
Globulin: 2.1 g/dL (ref 2.0–3.6)
Glucose: 159 mg/dL — ABNORMAL HIGH (ref 70–100)
Potassium: 4.4 mEq/L (ref 3.5–5.1)
Protein, Total: 5.2 g/dL — ABNORMAL LOW (ref 6.0–8.3)
Sodium: 132 mEq/L — ABNORMAL LOW (ref 136–145)

## 2021-03-17 LAB — CBC
Absolute NRBC: 0 10*3/uL (ref 0.00–0.00)
Hematocrit: 39.2 % (ref 34.7–43.7)
Hgb: 13.1 g/dL (ref 11.4–14.8)
MCH: 31.3 pg (ref 25.1–33.5)
MCHC: 33.4 g/dL (ref 31.5–35.8)
MCV: 93.8 fL (ref 78.0–96.0)
MPV: 11.7 fL (ref 8.9–12.5)
Nucleated RBC: 0 /100 WBC (ref 0.0–0.0)
Platelets: 209 10*3/uL (ref 142–346)
RBC: 4.18 10*6/uL (ref 3.90–5.10)
RDW: 13 % (ref 11–15)
WBC: 6.72 10*3/uL (ref 3.10–9.50)

## 2021-03-17 LAB — GLUCOSE WHOLE BLOOD - POCT
Whole Blood Glucose POCT: 177 mg/dL — ABNORMAL HIGH (ref 70–100)
Whole Blood Glucose POCT: 182 mg/dL — ABNORMAL HIGH (ref 70–100)
Whole Blood Glucose POCT: 153 mg/dL — ABNORMAL HIGH (ref 70–100)
Whole Blood Glucose POCT: 191 mg/dL — ABNORMAL HIGH (ref 70–100)

## 2021-03-17 LAB — GFR: EGFR: >60

## 2021-03-17 MED ORDER — BISACODYL 10 MG RE SUPP
10.0000 mg | Freq: Every day | RECTAL | Status: DC | PRN
Start: 2021-03-17 — End: 2021-03-29
  Administered 2021-03-23: 10 mg via RECTAL
  Filled 2021-03-17: qty 1

## 2021-03-17 MED ORDER — POLYETHYLENE GLYCOL 3350 17 G PO PACK
17.0000 g | PACK | Freq: Every day | ORAL | Status: DC | PRN
Start: 2021-03-17 — End: 2021-03-29
  Administered 2021-03-23 – 2021-03-27 (×3): 17 g via ORAL
  Filled 2021-03-17 (×5): qty 1

## 2021-03-17 MED ORDER — SENNOSIDES-DOCUSATE SODIUM 8.6-50 MG PO TABS
2.0000 | ORAL_TABLET | Freq: Every evening | ORAL | Status: DC
Start: 2021-03-17 — End: 2021-03-29
  Administered 2021-03-17 – 2021-03-27 (×10): 2 via ORAL
  Filled 2021-03-17 (×10): qty 2

## 2021-03-17 NOTE — PT Progress Note (Signed)
Physical Therapy  Inpatient Rehabilitation Daily Progress Note    Patient Name:  [name] [name]       Medical Record Number: [id number]   Date of Birth: 1951/07/11  Sex: Female          Room/Bed:  M502/M502-01    Rehabilitation Precautions/Restrictions:  Back Brace Applied: OOB  Spinal Precautions: no bending, no twisting, no lifting    Rehab Diagnosis: Lumbar compression fracture, closed, initial encounter [S32.000A]     Subjective   Patient Report: I don't really feel like doing the therapy  Patient/Caregiver Goals: Be in less pain and be able to move.  Pain Assessment:  Pain Score: 7-severe pain  Pain Location: Back  Pain Orientation: Mid  Pain Descriptors: Nagging, Aching  Pain Frequency: Increases with movement  Patient's Stated Comfort Functional Goal: 2-mild pain    Objective   Vitals:  Pre: 154/75    Interventions: Pt tx consisted of extensive education of spine vertebral and musculature anatomy, explanation of injury and healing process/time. Pt daughter present throughout the session as well. Pt received handouts with explanation of current condition, reviewed plan of care and discharge planning in coordination with her daughter. Pt received compressed pillow to use as brace when coughing. Pt educated on use of abdominal binder for core muscle support in bed and instructions on use during the day, under LSO brace in therapy. Pt able to verbalize understanding. Pt reviewed TA contractions in conjunction with Kegel exercises. Pt also reviewed compensation technique with bed mobility to decrease pain occurences.       Education Documentation  Reconditioning, taught by Ihor Dow, PT at 03/17/2021  3:21 PM.  Learner: Family, Patient  Readiness: Eager  Method: Explanation, Handout, Demonstration  Response: Needs Reinforcement    Verbalize detrimental effects of bedrest/inactivity, taught by Ihor Dow, PT at 03/17/2021  3:21 PM.  Learner: Family, Patient  Readiness: Eager  Method: Explanation,  Handout, Demonstration  Response: Needs Reinforcement    Pain management, taught by Ihor Dow, PT at 03/17/2021  3:21 PM.  Learner: Family, Patient  Readiness: Eager  Method: Explanation, Handout, Demonstration  Response: Needs Reinforcement    Home exercise program, taught by Ihor Dow, PT at 03/17/2021  3:21 PM.  Learner: Family, Patient  Readiness: Eager  Method: Explanation, Handout, Demonstration  Response: Needs Reinforcement    Education Comments  No comments found.         Assessment & Plan   Pt demo ongoing difficulty with pain management, will benefit from ongoing education regarding pain management and HEP o improve core stability and increase indep with pain management.    Plan:  Risks/Benefits/POC Discussed with Pt/Family: With patient  Treatment/Interventions: Exercise, Gait training, Stair training, Neuromuscular re-education, Functional transfer training, LE strengthening/ROM, Endurance training, Patient/family training, Equipment eval/education    Recommendation:  Discharge Recommendation: Home with home health PT  PT Therapy Recommendation: 5-6 days/week, 60-120 mins/day, 1:1 treatment  PT Estimated Length of Stay: 10 days from IE on 03/17/21

## 2021-03-17 NOTE — Plan of Care (Signed)
Problem: Bladder Management  Goal: LTG: Patient will be able to void spontaneously with PVR of less than 100 cc.   Outcome: Progressing     Problem: Endocrine  Goal: LTG: Prior D/C patient will demonstrate the ability to self manage diabetes at home and what to do when symptoms arise.  Outcome: Progressing     Problem: Pain Management  Goal: LTG: Pain will verbalized adequate pain control with scheduled/ current pain medication prior to D/C.  Outcome: Progressing     Problem: Safety Risk  Goal: LTG: Patient will call for assistance for out of bed activities and remain free from falls through hospitalization.  Outcome: Progressing

## 2021-03-17 NOTE — Plan of Care (Signed)
Patient was admitted during evening session, C/O some lower back pain, relieved with scheduled medication, VS sign WN. Oriented to room, call light bell,rehab safety protocols within arm reach. No new symptoms at this time, slept well.

## 2021-03-17 NOTE — Plan of Care (Signed)
Problem: Pain Management  Goal: LTG: Pain will verbalized adequate pain control with scheduled/ current pain medication prior to D/C.  Outcome: Progressing

## 2021-03-17 NOTE — PT Eval Note (Signed)
Physical Therapy  Inpatient Rehabilitation Initial Evaluation    Patient Name:  [name] [name]       Medical Record Number: [id number]   Date of Birth: 27-Dec-1950  Sex: Female          Room/Bed:  M502/M502-01    Rehabilitation Precautions/Restrictions:  Back Brace Applied: OOB  Spinal Precautions: no bending, no twisting, no lifting    Rehab Diagnosis: Lumbar compression fracture, closed, initial encounter [S32.000A]     History of Present Illness: [name] is a 70 year-old female with past medical history significant for hypertension hypercholesterolemia, COPD, diabetes, lung cancer, breast cancer, nicotine dependence who presents to the [facility] ED with c/o right-sided lower back pain that has been ongoing since April. Patient mentioned that she incurred an injury to her back when she tries to pick up a heavy suitcase sometime in April. She was diagnosed with Lumbar sprain by then. Patient came to the ED today because she was having severe pain that she could not tolerate. She mentioned that she has been taking Tylenol and Motrin and a muscle relaxant that was prescribed by her PCP but has stopped taking the muscle relaxant because she is supposed to take it food and has been eating much. Per attending MRI of the lumbar spine showed degenerative changes. There are multiple vertebral bodies with abnormal edema within enhancement including T10, T11, L1, and L2 vertebral bodies. There is evidence for a superior endplate fracture involving L1 vertebral body with approximately 15% vertebral body height loss. There is also a superior endplate fracture involving T11. No large associated soft tissue mass or abnormal enhancement is identified in the remaining vertebral bodies. The findings could represent multiple acute osteopenic compression fractures. Although less likely pathologic fractures cannot be excluded, especially involving the L1 vertebral body.  On admission to the hospital she was had hyponatremia which  has since resolved. She is from [place] and is in [place] visiting her daughter. She lives in a 1 level home with 1 step to enter and no stairs inside. Prior to her recent decline she was independent for all ADLs and mobility    Past Medical History:   Past Medical History:   Diagnosis Date    Breast cancer     COPD (chronic obstructive pulmonary disease)     Diabetes mellitus     Hypertension     Lung cancer        Prior Functioning: Everyday Activities  Self Care: Independent  Indoor Mobility (Ambulation): Independent  Stairs: Independent  Functional Cognition: Independent  Prior Device Use:  (None)  Mobility  Transfers: Independent  Walking: Independent  Walking assistive devices used: None  Stair negotiation: Artist  Occupation: PhD in counseling education  Pre-Hospital Vocational Status: Retired for age    Home Living Arrangements:  Living Arrangements: Family members (Daughter and son-in-law)  Type of Home: House  Home Layout: One level (1 short step to enter. One level upon entry.)  Bathroom Shower/Tub: Pension scheme manager: Midwife: Other (Comment) (Does not have equipment)  DME Currently at Home:  (None)  Home Living - Notes / Comments: Pt lives with daughter. Pt's home is in [place]. Daughter is moving to an extended stay hotel June30th.    Subjective   Patient Report: It hurts when I move.   Patient/Caregiver Goals: Be in less pain and be able to move.  Pain Assessment:  Pain Assessment: Numeric Scale (0-10)  Pain Score: 6-moderate pain  Pain Location: Back  Pain Orientation: Right, Mid  Pain Descriptors: Aching  Pain Frequency: Increases with movement  Effect of Pain on Daily Activities: moderate  Patient's Stated Comfort Functional Goal: 2-mild pain  Pain Intervention(s): Repositioned    Objective   Vitals:  Heart Rate: (!) 109  BP: 154/75  MAP (mmHg): 101      Mobility Functional Status:   Current   Status Current Status Discharge Goal   Functional Area:  Care Score:  Comments:    Roll Left and Right 4    Independent   Sit to Lying 4 Log roll technique   Independent   Lying to Sitting on Side of Bed 4 Log roll technique   Independent   Sit to Stand 4    Independent   Chair/Bed-to-Chair Transfer 4    Independent   Car Transfer 88    Independent   Walk 10 Feet 4 RW, decreased cadence. Ambulates 60'.   Independent   Walk 50 Feet with Two Turns 4 Ambulates   Independent   Walk 10 Feet on Uneven Surface 88    Independent   Walk 150 Feet 88    Independent   1 Step (Curb) 88    Independent   4 Steps 88    Independent   12 Steps 88    Supervision or touching assistance   Wheel 50 Feet with Two Turns          Wheel 150 Feet            Assessment:  Cognition: AXO x 4. Pt follows all commands, needs occasional repetition but suspect related to anxiety and fear of movement related to pain.     Gross ROM  Right Upper Extremity ROM: within functional limits  Left Upper Extremity ROM: within functional limits  Right Lower Extremity ROM: within functional limits  Left Lower Extremity ROM: within functional limits        Gross Strength  Right Upper Extremity Strength: 4+/5  Left Upper Extremity Strength: 4+/5  Right Lower Extremity Strength: 4/5  Left Lower Extremity Strength: 4/5    Sensation  Sensation: WNL  Proprioception: WNL    Motor Control  Gross: WFL  FMC: WFL    Balance  Sitting - Static: Fair  Sitting - Dynamic: Fair  Standing - Static: Poor (Frequently reaches for stable objects (furniture surfs))  Standing - Dynamic: Not tested      Encounter Problems       Encounter Problems (Active)       PT - General and Outcome Measures       LTG: Patient will adhere to ordered precautions 100% of time throughout mobility without verbal cues to ensure safety with all mobility tasks.       Start: 03/17/21            LTG: Patient will complete all transfers with recommended DME, modified independent assistance in order to maximize independence and prevent falls.       Start: 03/17/21             LTG: Patient will walk 150 ft with recommended DME, modified independent assistance in order to access areas of their home and community/decrease burden of care.       Start: 03/17/21            LTG: Patient will ascend/descend two curb steps with recommended DME, modified independent assistance in order to safely and independently access their home/community.  Start: 03/17/21                   Education Documentation  Rehab techniques/procedure, taught by Doylene Canning, PT at 03/17/2021  2:54 PM.  Learner: Patient  Readiness: Acceptance  Method: Explanation  Response: Verbalizes Understanding    Precautions, taught by Doylene Canning, PT at 03/17/2021  2:54 PM.  Learner: Patient  Readiness: Acceptance  Method: Explanation  Response: Verbalizes Understanding    Plan of care, taught by Doylene Canning, PT at 03/17/2021  2:54 PM.  Learner: Patient  Readiness: Acceptance  Method: Explanation  Response: Verbalizes Understanding    Education Comments  No comments found.        Assessment & Plan   Assessment: Decreased LE strength, Decreased endurance/activity tolerance, Decreased functional mobility, Decreased balance, Gait impairment    Pt is a 70 yo F s/p L1/T11-12 wedge compression fracture. She presents to ARF with increased pain, spinal precautions, decreased LE strength, and impaired standing balance that impacts her ability to safely ambulate and negotiate stairs in order to safely enter her home environment. Pt currently lives with daughter in [place], however states she plans to return home to [place] which is where her house is located. Pt will benefit from intense skilled PT using an interdisciplinary approach to treatment in order to maximize functional independence to promote safe [place] to home environment.     Plan:  Risks/Benefits/POC Discussed with Pt/Family: With patient  Treatment/Interventions: Exercise, Gait training, Stair training, Neuromuscular re-education, Functional transfer training,  LE strengthening/ROM, Endurance training, Patient/family training, Equipment eval/education    Recommendation:  Discharge Recommendation: Home with home health PT  PT Therapy Recommendation: 5-6 days/week, 60-120 mins/day, 1:1 treatment  PT Estimated Length of Stay: 10 days from IE on 03/17/21

## 2021-03-17 NOTE — OT Eval Note (Signed)
Occupational Therapy  Inpatient Rehabilitation Initial Evalution    Patient Name:  [name] [name]       Medical Record Number: [id number]   Date of Birth: 07/04/1951  Sex: Female          Room/Bed:  M502/M502-01    Rehabilitation Precautions/Restrictions:  Back Brace Applied: OOB  Spinal Precautions: no bending, no lifting, no twisting    Rehab Diagnosis: Lumbar compression fracture, closed, initial encounter [S32.000A]     History of Present Illness:   [name] [name] is a 70 year-old female with past medical history significant for hypertension hypercholesterolemia, COPD, diabetes, lung cancer, breast cancer, nicotine dependence who presents to the [facility] ED with c/o right-sided lower back pain that has been ongoing since April. Patient mentioned that she incurred an injury to her back when she tries to pick up a heavy suitcase sometime in April. She was diagnosed with Lumbar sprain by then. Patient came to the ED today because she was having severe pain that she could not tolerate. She mentioned that she has been taking Tylenol and Motrin and a muscle relaxant that was prescribed by her PCP but has stopped taking the muscle relaxant because she is supposed to take it food and has been eating much. Per attending MRI of the lumbar spine showed degenerative changes. There are multiple vertebral bodies with abnormal edema within enhancement including T10, T11, L1, and L2 vertebral bodies. There is evidence for a superior endplate fracture involving L1 vertebral body with approximately 15% vertebral body height loss. There is also a superior endplate fracture involving T11. No large associated soft tissue mass or abnormal enhancement is identified in the remaining vertebral bodies. The findings could represent multiple acute osteopenic compression fractures. Although less likely pathologic fractures cannot be excluded, especially involving the L1 vertebral body.  On admission to the hospital she was had hyponatremia  which has since resolved. She is from [place] and is in [place] visiting her daughter. She lives in a 1 level home with 1 step to enter and no stairs inside. Prior to her recent decline she was independent for all ADLs and mobility    Past Medical History:   Past Medical History:   Diagnosis Date    Breast cancer     COPD (chronic obstructive pulmonary disease)     Diabetes mellitus     Hypertension     Lung cancer        Prior Level of Function  Prior level of function: Independent with ADLs, Ambulates independently  Baseline Activity Level: Other (comment) (Pt is unmarried and has 3 adult daughters (2 in [place], 1 in [place]); retired immediately prior to [employer] pandemic. Denies having hobbies or interests 2/2 pandemic and the recent death of her brother)    Home Living Arrangements:  Living Arrangements: Alone  Type of Home: House  Home Layout: One level (1 short step to enter. One level upon entry.)  Bathroom Shower/Tub: Pension scheme manager: Midwife: Other (Comment) (Does not have equipment)  DME Currently at Home:  (None)  Home Living - Notes / Comments: Reports daughters could assist as needed in evening, however limited daytime caregiver availability    Subjective   Patient Report: "I don't know why but I just don't have the motivation or energy to do anything today"  Patient/Caregiver Goals: To be in less pain  Pain Assessment:      Objective   Vitals:  Heart Rate: (!) 103  BP: 157/77  MAP (mmHg): 104    Self Care Functional Status:   Current Status Current Status Discharge Goal   Functional Area: Care Score: Comments:    Eating 6    Independent   Oral Hygiene 4 Standing   Independent   Toileting Hygiene 4    Independent   Shower/Bathe Self 4 Completed at sink per request 2/2 fatigue   Independent   Upper Body Dressing 4    Independent   Lower Body Dressing 3    Independent   Putting On/Taking Off Footwear 3    Independent     Mobility Functional Status:   Current   Status Current  Status Discharge Goal   Functional Area: Care Score: Comments:    Roll Left and Right 88        Sit to Lying 3        Lying to Sitting on Side of Bed 88        Sit to Stand 3        Chair/Bed-to-Chair Transfer 4        Toilet Transfer 4    Independent   Picking Up Object 88    Independent       Assessment:  Cognition:   Pt presents with flat affect and mild processing delays, however suspect may be better associated with depressive symptoms, as pt endorses recent emotional difficulties and sense of isolation. Follows multi-step commands independently. Will likely required reinforcement of spinal precautions.     Vision - Complex Assessment  Additional Comments: Pt wears glasses for reading and distance. Denies changes in vision.      Gross ROM  Gross ROM: within functional limits      Gross Strength  Right Upper Extremity Strength: within functional limits  Left Upper Extremity Strength: within functional limits  Right Lower Extremity Strength: within functional limits (Pt presents with generalized deconditioning 2/2 recent transition to sedentary lifestyle with onset of back pain in April)  Left Lower Extremity Strength: within functional limits    Sensation  Sensation: WNL  Proprioception: WNL    Motor Control  Gross: WFL  FMC: WFL; manages all containers independently    Balance  Balance: needs focused assessment  Sitting - Static: Good  Sitting - Dynamic: Fair  Standing - Static: Fair  Standing - Dynamic: Poor    Encounter Problems       Encounter Problems (Active)       OT General       STG: Pt will adhere to spinal precautions independently without VCs       Start: 03/17/21   Expected End: 03/24/21            STG: Pt will don/doff brace with supervision and 0 VCs for technique       Start: 03/17/21   Expected End: 03/24/21            STG: Pt will don LB clothing with AE PRN and SBA       Start: 03/17/21   Expected End: 03/24/21            LTG: Pt will complete basic ADL routine with mod I and AE/DME PRN        Start: 03/17/21            LTG: Pt will complete household transfers with mod I and AD/DME PRN       Start: 03/17/21            LTG: Pt will complete simple  IADL of choice with mod I in order to promote safe return home with limited caregiver assist       Start: 03/17/21            LTG: Pt will verbalize and independently initiate pain management strategies to promote increased activity tolerance        Start: 03/17/21                   Education Documentation  Rehab techniques/procedure, taught by Alfonse Alpers, OT at 03/17/2021  2:25 PM.  Learner: Patient  Readiness: Acceptance  Method: Explanation, Demonstration  Response: Verbalizes Understanding, Needs Reinforcement    Precautions, taught by Alfonse Alpers, OT at 03/17/2021  2:25 PM.  Learner: Patient  Readiness: Acceptance  Method: Explanation, Demonstration  Response: Verbalizes Understanding, Needs Reinforcement    Plan of care, taught by Alfonse Alpers, OT at 03/17/2021  2:25 PM.  Learner: Patient  Readiness: Acceptance  Method: Explanation, Demonstration  Response: Verbalizes Understanding, Needs Reinforcement    Increase physical activity, taught by Alfonse Alpers, OT at 03/17/2021  2:25 PM.  Learner: Patient  Readiness: Acceptance  Method: Explanation, Demonstration  Response: Verbalizes Understanding, Needs Reinforcement    Emotional adjustments, taught by Alfonse Alpers, OT at 03/17/2021  2:25 PM.  Learner: Patient  Readiness: Acceptance  Method: Explanation, Demonstration  Response: Verbalizes Understanding, Needs Reinforcement    Education Comments  No comments found.        Assessment & Plan   Pt is a 70yo female admitted to [facility] acute rehab s/p recent back injury when lifting a suitcase with associated "popping" sensation followed by chronic back pain over course of 2 months. Due to chronic pain, pt reports transition to sedentary lifestyle, engaging minimally in IADLs and primarily remaining seated with heating pad for pain management.  Thusly, pt has experienced decline in ADL status with limited activity tolerance, poor pain tolerance, and difficulty completing tasks independently within recommended precautions/use of LSO. Pt lives alone in [place] and will need to return to [place] upon discharge, therefore will benefit from skilled OT tx from an interdisciplinary perspective in order to maximize safety and independence with daily routine.    Plan:  Risks/Benefits/POC Discussed with Pt/Family: With patient  Treatment Interventions: ADL retraining, Functional transfer training, UE strengthening/ROM, Endurance training, Patient/Family training, Equipment eval/education, Compensatory technique education    Recommendation:  OT Therapy Recommendation: 5-6 days/week, 60-120 mins/day  OT Estimated Length of Stay: 10-14 days from IE (03/17/21)    Groups appropriate for patient:   Pt is not group appropriate at this time

## 2021-03-18 ENCOUNTER — Inpatient Hospital Stay: Payer: Medicare Other

## 2021-03-18 LAB — GLUCOSE WHOLE BLOOD - POCT
Whole Blood Glucose POCT: 183 mg/dL — ABNORMAL HIGH (ref 70–100)
Whole Blood Glucose POCT: 156 mg/dL — ABNORMAL HIGH (ref 70–100)
Whole Blood Glucose POCT: 156 mg/dL — ABNORMAL HIGH (ref 70–100)
Whole Blood Glucose POCT: 185 mg/dL — ABNORMAL HIGH (ref 70–100)

## 2021-03-18 LAB — MRSA CULTURE
Culture MRSA Surveillance: NEGATIVE
Culture MRSA Surveillance: NEGATIVE

## 2021-03-18 NOTE — Progress Notes (Signed)
Name: [name] [name]     MRN: [id number]    Ht: Height: 165.1 cm (5\' 5" )  Wt:Weight: 60 kg (132 lb 4.4 oz) Adm Date: 03/16/2021     Affected side: spine    Reason for Visit: fitting and delivery    Observations: Patient found seated in wheelchair.  Transferred to bed, brace applied    Today's Session: fit and delivered    Brace Issued: [place] 464/6'     Ordering Physician: Margaretmary Dys, MD    Delivery Receipt Signed by:  Patient       Patient/Caretaker is able to properly don and doff device - Yes and Needs additional reinforcement          Seen by: Sharlett Iles, CPO        [address]  [place], [place] [address]  [phone]

## 2021-03-18 NOTE — Plan of Care (Signed)
Problem: Endocrine  Goal: LTG: Prior D/C patient will demonstrate the ability to self manage diabetes at home and what to do when symptoms arise.  Outcome: Progressing   BS fluctuates sometimes and may require coverage, otherwise stable at bedtime.    Problem: Pain Management  Goal: LTG: Pain will verbalized adequate pain control with scheduled/ current pain medication prior to D/C.  Outcome: Progressing  Pain is managed with scheduled pain medication.    Problem: Safety Risk  Goal: LTG: Patient will call for assistance for out of bed activities and remain free from falls through hospitalization.  Outcome: Progressing   Calls appropriately for assistance when out of bed.    Problem: Bladder Management  Goal: LTG: Patient will be able to void spontaneously with PVR of less than 100 cc.   Outcome: Progressing  Continent of bladder currently, monitoring and performing bladder scan as scheduled.

## 2021-03-18 NOTE — Progress Notes (Signed)
Your doctor has ordered a TLSO brace for you.  Please DO NOT sleep in your brace.  Wear a cotton T shirt underneath your brace.  When it gets damp from perspiration, change to a dry T shirt.  Mild cleaning can be done with baby wipes, more involved cleaning is below.        Orthosis Wearing Schedule  Adjustment Period  There is an adjustment period with nearly every type of orthosis.  The body is not used to wearing an orthosis, and needs to adjust to it gradually.  Typically, the orthotist will give you a wearing schedule to give your body adequate time to adjust to the orthosis.  It is important to adhere to the wearing schedule.  Over-wearing the brace at first can result in soreness and fatigue.    Wearing schedule - A typical wearing schedule is starting with 2 hours of wear per day, and gradually increasing by 2 hour increments each successive day.  If it begins to feel sore, you can remove it early for that day.   But do not over-wear the orthosis.    If your doctor has given specific wearing instructions, their instructions supersede these instructions.    If manufacturers wearing instructions were provided these are to be followed.  It is not recommended you operate a vehicle while wearing your orthosis as it can impede your driving.                                                                                               Need for Adjustment  Pay close attention to your skin when you remove the orthosis.  If you are wearing the orthosis correctly, and it is a good fit, you should have an overall pink or dark hue of the skin when the orthosis is removed.  However, the skin tone should quickly return to normal once removed.  If a red or dark spot stays for longer than 30 minutes after the orthosis is removed, you are in danger of developing a hot-spot or blister.  Also check for any bruising, callous or rash.  Please discontinue wear and schedule a follow up appointment with your orthotist.  The orthotist  can make an adjustment that will relieve this spot and restore proper fit and function to the brace.    Call our office if you need an adjustment, have questions or need to schedule follow up appointment.  Cleaning, Care and Maintenance  Plastic braces can be cleaned with soap and water and thoroughly washed.  Another option is to mix a half and half solution of alcohol and water and rub down the orthosis.  Wipe dry.    Braces with cloth should be hand washed with soap and water, and well rinsed.  Lay flat or hang to dry.      [address] [name] [name]     [phone](P)/[phone](F)         [place], [place]  [address]  Updated 05/03/2015

## 2021-03-18 NOTE — Progress Notes (Signed)
[name] [name] MRN: [id number]  70 y.o.  female    NUTRITION:  Reason for assessment: MST 2 (wt loss)    Assessment - Pt's appetite is slowly improving. Pt is willing to try a nutrition supplement for additional calories/protein.    Past Medical History:   Diagnosis Date    Breast cancer     COPD (chronic obstructive pulmonary disease)     Diabetes mellitus     Hypertension     Lung cancer      Wt Readings from Last 30 Encounters:   03/18/21 60 kg (132 lb 4.4 oz)   03/16/21 77.1 kg (170 lb)     Social History     Socioeconomic History    Marital status: Divorced   Tobacco Use    Smoking status: Former     Pack years: 0.00     Types: Cigarettes     Passive exposure: Past   Vaping Use    Vaping Use: Never used   Substance and Sexual Activity    Alcohol use: Not Currently    Drug use: Never    Sexual activity: Never     Active Hospital Problems    Diagnosis    Lumbar compression fracture, closed, initial encounter     Allergies   Allergen Reactions    Erythromycin Itching and Rash     GI Symptoms: WDL  Skin: Intact    Orders Placed This Encounter   Procedures    Diet Consistent Carbohydrate and Heart Healthy    Glucerna Shake (8 Oz Cans) Quantity: A. One; Flavor: Special educational needs teacher; Frequency: Daily with lunch      PO intake: 100%    Current Meds:  calcium citrate-vitamin D, 1 tablet, Daily with lunch  heparin (porcine), 5,000 Units, Q8H SCH  insulin glargine, 10 Units, QHS  insulin lispro, 1-3 Units, QHS  insulin lispro, 1-5 Units, TID AC  letrozole, 2.5 mg, Daily  lisinopril, 10 mg, Daily  metFORMIN, 500 mg, BID Meals  nicotine, 1 patch, Daily  rosuvastatin, 40 mg, QHS  senna-docusate, 2 tablet, QHS  tiotropium, 2 puff, QAM  vitamin D (cholecalciferol), 25 mcg, Daily    Recent Labs:  Recent Labs   Lab 03/17/21  0515   WBC 6.72   Hgb 13.1   Hematocrit 39.2   MCV 93.8   Platelets 209     Recent Labs   Lab 03/17/21  0515   Sodium 132*   Potassium 4.4   Chloride 96*   CO2 27   BUN 9.0   Creatinine 0.5*   Glucose 159*   Calcium  8.8   EGFR >60.0     Recent Labs   Lab 03/17/21  0515   Albumin 3.1*       Intake/Output Summary (Last 24 hours) at 03/18/2021 1105  Last data filed at 03/17/2021 2200  Gross per 24 hour   Intake 490 ml   Output 0 ml   Net 490 ml     Anthropometrics  Height: 165.1 cm (5\' 5" )  Weight: 60 kg (132 lb 4.4 oz)  Weight Change: -1.15  BMI (calculated): 22.3    Estimated Nutrition Needs:  Estimated Energy Needs  Total Energy Estimated Needs: 1500-1800 kcals/day  Method for Estimating Needs: 25-30 kcals/kg    Estimated Protein Needs  Total Protein Estimated Needs: 60-75g Pro/day  Method for Estimating Needs: 1-1.2g Pro/kg    Estimated Carbohydrate Needs  Total Carbohydrate Estimated Needs: 210g CHO/day  Method for Estimating Needs: 50%  Fluid Needs  Total Fluid Estimated Needs: 1500-1800 mls/day or per MD  Method for Estimating Needs: 1 ml/kcal or per MD    Learning & Discharge Planning Needs: none  Religious/Cultural Food Practices: none    Nutrition Diagnosis:   No nutrition diagnosis    Intervention:  Trial Glucerna Shake (8 oz provides 220 cals, 10g pro).    Goals:  Pt will eat at least 75% of meals by next visit.    M/E:  Monitor: PO intake, weights, and labs  Follow-up within 5-14 days    Cheral Marker, RDN

## 2021-03-19 LAB — COMPREHENSIVE METABOLIC PANEL
ALT: 118 U/L — ABNORMAL HIGH (ref 0–55)
AST (SGOT): 100 U/L — ABNORMAL HIGH (ref 5–34)
Albumin/Globulin Ratio: 1.4 (ref 0.9–2.2)
Albumin: 3.2 g/dL — ABNORMAL LOW (ref 3.5–5.0)
Alkaline Phosphatase: 234 U/L — ABNORMAL HIGH (ref 37–117)
Anion Gap: 10 (ref 5.0–15.0)
BUN: 8 mg/dL (ref 7.0–19.0)
Bilirubin, Total: 0.5 mg/dL (ref 0.2–1.2)
CO2: 28 mEq/L (ref 22–29)
Calcium: 8.7 mg/dL (ref 8.5–10.5)
Chloride: 91 mEq/L — ABNORMAL LOW (ref 100–111)
Creatinine: 0.6 mg/dL (ref 0.6–1.0)
Globulin: 2.3 g/dL (ref 2.0–3.6)
Glucose: 135 mg/dL — ABNORMAL HIGH (ref 70–100)
Potassium: 4.2 mEq/L (ref 3.5–5.1)
Protein, Total: 5.5 g/dL — ABNORMAL LOW (ref 6.0–8.3)
Sodium: 129 mEq/L — ABNORMAL LOW (ref 136–145)

## 2021-03-19 LAB — CBC
Absolute NRBC: 0 10*3/uL (ref 0.00–0.00)
Hematocrit: 39.5 % (ref 34.7–43.7)
Hgb: 13.2 g/dL (ref 11.4–14.8)
MCH: 31.4 pg (ref 25.1–33.5)
MCHC: 33.4 g/dL (ref 31.5–35.8)
MCV: 94 fL (ref 78.0–96.0)
MPV: 11 fL (ref 8.9–12.5)
Nucleated RBC: 0 /100 WBC (ref 0.0–0.0)
Platelets: 299 10*3/uL (ref 142–346)
RBC: 4.2 10*6/uL (ref 3.90–5.10)
RDW: 13 % (ref 11–15)
WBC: 6.58 10*3/uL (ref 3.10–9.50)

## 2021-03-19 LAB — GLUCOSE WHOLE BLOOD - POCT
Whole Blood Glucose POCT: 151 mg/dL — ABNORMAL HIGH (ref 70–100)
Whole Blood Glucose POCT: 162 mg/dL — ABNORMAL HIGH (ref 70–100)
Whole Blood Glucose POCT: 155 mg/dL — ABNORMAL HIGH (ref 70–100)
Whole Blood Glucose POCT: 233 mg/dL — ABNORMAL HIGH (ref 70–100)

## 2021-03-19 LAB — GFR: EGFR: >60

## 2021-03-19 NOTE — Progress Notes (Signed)
PHYSICAL MEDICINE AND REHABILITATION  PROGRESS NOTE -- FACE-TO-FACE ENCOUNTER    Date Time: 03/19/21 5:14 PM  Patient Name: [name] [name],[name] [name]    Admission date:  03/16/2021    Subjective:     The patient reports adequate eating and drinking.  Patient denies any new pain.  No new issues with bowel and bladder.  She had a BM yesterday.    Functional Status:     Session focused on gross strengthening, improving CV endurance to improve performance during f/t towards ADLs, selfcare and education on sleep hygiene. Body weight Seated exercises included scap retraction, shoulder shrugs, shoulder circles, forward punches, seated marches, ankle pumps, glute squeezes, w/c push ups to improve gross strength 2/2 increased reliance on UB for f/ts and mobility. Pt. Participated in gentle neck and UE stretches for improved comfort and cool down. Pt. Participated in discussion of sleep hygiene including: routine, spending less time in bed during the day, relaxing activities prior to bed to improve quality of sleep at time to facilitate improved healing and rest. All exercises within spinal precautions. Therapist transporting pt. Back to room post session, pt. Performed w/c>EOB using RW and EOB>semi sup using log roll - CGA. Pt. Demo'd flat affect throughout session.   See Education Activity for details.     Medications:   Medication reviewed by me:     Scheduled Meds: PRN Meds:   calcium citrate-vitamin D, 1 tablet, Oral, Daily with lunch  heparin (porcine), 5,000 Units, Subcutaneous, Q8H SCH  insulin glargine, 10 Units, Subcutaneous, QHS  insulin lispro, 1-3 Units, Subcutaneous, QHS  insulin lispro, 1-5 Units, Subcutaneous, TID AC  letrozole, 2.5 mg, Oral, Daily  lisinopril, 10 mg, Oral, Daily  metFORMIN, 500 mg, Oral, BID Meals  nicotine, 1 patch, Transdermal, Daily  rosuvastatin, 40 mg, Oral, QHS  senna-docusate, 2 tablet, Oral, QHS  tiotropium, 2 puff, Inhalation, QAM  vitamin D (cholecalciferol), 25 mcg, Oral,  Daily        Continuous Infusions:   acetaminophen, 1,000 mg, Q6H PRN  bisacodyl, 10 mg, Daily PRN  bisacodyl, 10 mg, Daily PRN  glucagon (rDNA), 1 mg, PRN   And  dextrose, 250 mL, PRN   And  dextrose, 25 g, PRN   And  dextrose, 25 g, PRN  melatonin, 3 mg, QHS PRN  ondansetron, 4 mg, Q8H PRN   Or  ondansetron, 4 mg, Q8H PRN  oxyCODONE, 10 mg, Q4H PRN  oxyCODONE, 5 mg, Q4H PRN  polyethylene glycol, 17 g, Daily PRN  sodium phosphate, 1 enema, Daily PRN          Medication Review  A complete drug regimen review was completed: Yes  Were any drug issues found during review?: No          Review of Systems:   A comprehensive review of systems was: No fevers, chills, nausea, vomiting, chest pain, shortness of breath, cough, headache, double vision.  All others negative.    Physical Exam:     Vitals:    03/19/21 0845 03/19/21 0850 03/19/21 1308 03/19/21 1617   BP: 160/78 160/84 113/67 116/58   Pulse:  91 98 91   Resp:    20   Temp:  97.7 F (36.5 C)  98.4 F (36.9 C)   TempSrc:  Oral  Oral   SpO2:  91% 93% 90%   Weight:       Height:           Intake and Output Summary (Last 24 hours) at Date Time  Intake/Output Summary (Last 24 hours) at 03/19/2021 1714  Last data filed at 03/19/2021 1200  Gross per 24 hour   Intake 600 ml   Output --   Net 600 ml     P.O.: 360 mL (03/19/21 1200)     Urine: 150 mL (03/18/21 0830)  Bladder Scan Volume (mL): 108 mL (03/19/21 0556)        No acute distress.    Chest rhythmic movement of chest with normal respirations.  Abdomen nondistended    Labs:   No results for input(s): GLUCOSEWHOLE in the last 24 hours.    Recent Labs   Lab 03/19/21  0608 03/17/21  0515   WBC 6.58 6.72   Hgb 13.2 13.1   Hematocrit 39.5 39.2   Platelets 299 209        Recent Labs   Lab 03/19/21  0608 03/17/21  0515   Sodium 129* 132*   Potassium 4.2 4.4   Chloride 91* 96*   CO2 28 27   BUN 8.0 9.0   Creatinine 0.6 0.5*   Calcium 8.7 8.8   Albumin 3.2* 3.1*   Protein, Total 5.5* 5.2*   Bilirubin, Total 0.5 0.3   Alkaline  Phosphatase 234* 91   ALT 118* 25   AST (SGOT) 100* 23   Glucose 135* 159*             Results       Procedure Component Value Units Date/Time    Glucose Whole Blood - POCT [[id number]]  (Abnormal) Collected: 03/19/21 1614     Updated: 03/19/21 1622     Whole Blood Glucose POCT 155 mg/dL     Glucose Whole Blood - POCT [[id number]]  (Abnormal) Collected: 03/19/21 1105     Updated: 03/19/21 1108     Whole Blood Glucose POCT 162 mg/dL     Comprehensive metabolic panel [[id number]]  (Abnormal) Collected: 03/19/21 0608    Specimen: Blood Updated: 03/19/21 0750     Glucose 135 mg/dL      BUN 8.0 mg/dL      Creatinine 0.6 mg/dL      Sodium 629 mEq/L      Potassium 4.2 mEq/L      Chloride 91 mEq/L      CO2 28 mEq/L      Calcium 8.7 mg/dL      Protein, Total 5.5 g/dL      Albumin 3.2 g/dL      AST (SGOT) 528 U/L      ALT 118 U/L      Alkaline Phosphatase 234 U/L      Bilirubin, Total 0.5 mg/dL      Globulin 2.3 g/dL      Albumin/Globulin Ratio 1.4     Anion Gap 10.0    GFR [[id number]] Collected: 03/19/21 0608     Updated: 03/19/21 0750     EGFR >60.0       CBC without differential [[id number]] Collected: 03/19/21 0608    Specimen: Blood Updated: 03/19/21 0729     WBC 6.58 x10 3/uL      Hgb 13.2 g/dL      Hematocrit 03.4 %      Platelets 299 x10 3/uL      RBC 4.20 x10 6/uL      MCV 94.0 fL      MCH 31.4 pg      MCHC 33.4 g/dL      RDW 13 %      MPV 11.0 fL  Nucleated RBC 0.0 /100 WBC      Absolute NRBC 0.00 x10 3/uL     Glucose Whole Blood - POCT [[id number]]  (Abnormal) Collected: 03/19/21 0541     Updated: 03/19/21 0547     Whole Blood Glucose POCT 151 mg/dL     Glucose Whole Blood - POCT [[id number]]  (Abnormal) Collected: 03/18/21 2042     Updated: 03/18/21 2047     Whole Blood Glucose POCT 185 mg/dL     Glucose Whole Blood - POCT [[id number]]  (Abnormal) Collected: 03/18/21 1644     Updated: 03/18/21 1718     Whole Blood Glucose POCT 156 mg/dL                  Rads:   Radiological Procedure reviewed.  Radiology Results (24  Hour)       ** No results found for the last 24 hours. **                Assessment and Plan:     Ambulation and activities of daily living dysfunction    L1 and T11 compression fractures  -- Continue with PT and OT.  Follow gains.  -- IPOC completed.  -Discharge planning    Transaminitis  -- 6/20 patient is taking Tylenol 1000 mg perhaps once a day as needed for pain.  We will discontinue Crestor 40 mg p.o. nightly.  Follow daily CMP's for now.    Hypertension  -- Continue regimen.  Continue to monitor.  Some lability noted.    Hypercholesterolemia  -- 6/20 hold Crestor 40 mg nightly because of elevated LFTs.  Continue to follow LFTs.    Diabetes mellitus type II with hyperglycemia  --6/20: Continue to monitor.  Fairly stable.    Hyponatremia  --6/20 possibly due to oral intake.  Continue to monitor him 2 days.  Encourage oral intake.  Follow daily CMP for now.    COPD  -- No complaints of shortness of breath.  Follow trend    History of lung cancer    History of breast cancer    History of nicotine dependence     DVT prophylaxis: Ambulation and heparin 5000 units subcu every 8 hours  --6/19: Negative bilateral lower limb venous duplex for DVT    Patient Active Problem List   Diagnosis    Lumbar compression fracture, closed, initial encounter       Continue comprehensive and intensive inpatient rehab program, including:   Physical therapy 60-120 min daily, 5-6 times per week, Occupational therapy 60-120 min daily, 5-6 times per week, Case management and Rehabilitation nursing    Signed by: Virl Cagey, MD    [facility]    If there are questions or concerns about the content of this note or information contained within the body of this dictation they should be addressed directly with the author for clarification.

## 2021-03-19 NOTE — Plan of Care (Signed)
Problem: Bladder Management  Goal: LTG: Patient will be able to void spontaneously with PVR of less than 100 cc.   Outcome: Progressing     Problem: Endocrine  Goal: LTG: Prior D/C patient will demonstrate the ability to self manage diabetes at home and what to do when symptoms arise.  Outcome: Progressing     Problem: Pain Management  Goal: LTG: Pain will verbalized adequate pain control with scheduled/ current pain medication prior to D/C.  Outcome: Progressing     Problem: Safety Risk  Goal: LTG: Patient will call for assistance for out of bed activities and remain free from falls through hospitalization.  Outcome: Progressing     Problem: Self Care Management  Goal: LTG: Pt will complete all ADLs and household transfers with mod I and AD/DME/AE PRN  Outcome: Progressing

## 2021-03-19 NOTE — Progress Notes (Signed)
Therapeutic Recreation  Inpatient Rehabilitation Initial Evalution    Patient Name:  [name] [name]       Medical Record Number: [id number]   Date of Birth: [birth year]  Sex: Female          Room/Bed:  M502/M502-01    Rehabilitation Precautions/Restrictions:  Back Brace Applied: OOB  Spinal Precautions: no bending, no twisting, no lifting    Rehab Diagnosis: Lumbar compression fracture, closed, initial encounter [S32.000A]     Past Medical History:   Past Medical History:   Diagnosis Date    Breast cancer     COPD (chronic obstructive pulmonary disease)     Diabetes mellitus     Hypertension     Lung cancer        Subjective   Patient Report: If I don't feel like doing the activities, I don't have to do I?  Patient/Caregiver Goals: Be in less pain and be able to move.  Therapeutic Recreation  Intellectual Interests: Puzzles  Home Activities: Television, Plant Care Merchant navy officer)  Social Interests: Hydrographic surveyor Interests: Other, Tax adviser (Coloring)  Activities: Arts/Crafts, Games, Gardening    Objective   Pt greeted in her room, and educated about TR services. Pt is agreeable to participate in TR during her IR stay.  Encounter Problems       Encounter Problems (Active)       Endurance/Activity Tolerance       Pt will participate in TR services 2-3x/wk to address their endurance/activity tolerance during leisure engagement       Start: 03/19/21               Leisure Skills       Pt will participate in TR services 2-3x/wk to address their leisure independence        Start: 03/19/21               Psychosocial Support/Stimulation       Pt will participate in TR services 2-3x/wk for psychosocial benefits of leisure engagement       Start: 03/19/21                   Assessment & Plan   Pt will benefit from TR services 2-3x/week to address her activity tolerance, leisure independence and psychosocial benefits of leisure engagement.  Groups appropriate for patient:   Arts and Crafts, Horticultural    Group  Justification: Carryover skills learned in individual sessions in group format  Emotional and/or spiritual support from peers experiencing similar circumstances

## 2021-03-19 NOTE — Individualized Plan of Care (Signed)
Physical Medicine and Rehabilitation  Individualized Overall Plan of Care    Patient Name: [name] [name]   Medical Record Number: [id number]   Date of Birth: 1951/05/22   Age: 70 y.o.   Sex: Female     Primary Rehab (Etiologic) Diagnosis: Lumbar compression fracture, closed, initial encounter      Admit Date/Time: 03/16/2021  9:30 PM     Prognosis: good    Plan of Care  Anticipated Discharge Date: 03/31/2021  Estimated LOS: 14 days  Anticipated Discharge Destination: home  Is the duration of therapy 15 hours over 7 days instead of the standard 3 hours of therapy, 5 out of 7 days a week?: No  Medical Necessity Expected Level Rationale: Pain control and nutrition will be optimized.   The patient will benefit from continued services by: Physical therapy 60-120 min daily, 5-6 times per week for 14 days, Occupational therapy 60-120 min daily, 5-6 times per week for 14 days, Therapeutic recreation, Case management, and Rehabilitation nursing    The following is a list of patient problems that have been identified by the interdisciplinary team:   Encounter Problems       Encounter Problems (Active)       Bladder Management       LTG: Patient will be able to void spontaneously with PVR of less than 100 cc.        Expected End: 03/30/21       Patient is continent of bladder currently with insufficient amount of urine. Will monitor PVR's and in and out as ordered.            Compromised Tissue integrity       Damaged tissue is healing and protected       Start: 03/17/21       Interventions:  1. Monitor/assess Braden scale every shift  2. Provide wound care per wound care algorithm  3. Reposition patient every 2 hours and as needed unless able to reposition self  4. Increase activity as tolerated/progressive mobility  5. Relieve pressure to bony prominences for patients at moderate and high risk  6. Avoid shearing injuries   7. Keep intact skin clean and dry  8. Use bath wipes, not soap and water, for daily bathing   9. Use  incontinence wipes for cleaning urine, stool and caustic drainage; Foley care as needed   10. Monitor external devices/tubes for correct placement to prevent pressure, friction and shearing   11. Encourage use of lotion/moisturizer on skin  12. Monitor patient's hygiene practices  13. Consult/collaborate with wound care nurse   14. Utilize specialty bed  15. Consider placing an indwelling catheter if incontinence interferes with healing of stage 3 or 4 pressure injury         Nutritional status is improving       Start: 03/17/21       Interventions:  1. Assist patient with eating   2. Allow adequate time for meals   3. Encourage patient to take dietary supplement(s) as ordered   4. Collaborate with Clinical Nutritionist  5. Include patient/patient care companion in decisions related to nutrition            Endocrine       LTG: Prior D/C patient will demonstrate the ability to self manage diabetes at home and what to do when symptoms arise.       Expected End: 04/05/21               Mobility  LTG: Pt will be mod I for navigating community distances and for curb step in order to safely enter home.              Pain Management       LTG: Pain will verbalized adequate pain control with scheduled/ current pain medication prior to D/C.       Expected End: 03/30/21               Safety Risk       LTG: Patient will call for assistance for out of bed activities and remain free from falls through hospitalization.       Expected End: 04/30/21       No safety issues currently. Patient calls appropriately for out of bed assistance.            Self Care Management       LTG: Pt will complete all ADLs and household transfers with mod I and AD/DME/AE PRN       Start: 03/17/21                    Anticipated Interventions    The patient will undergo inpatient rehabilitation to manage complex medical and rehabilitation needs related to Lumbar compression fracture, closed, initial encounter.    The patient will receive  interdisciplinary care, including daily rehabilitation physician management for the following: Pain and Nutrition    Patient goals were discussed and agreed upon with the patient. I have reviewed the therapy notes and agree with the current plan of care.      Virl Cagey, MD  03/19/2021 3:58 PM

## 2021-03-19 NOTE — Plan of Care (Signed)
Problem: Bladder Management  Goal: LTG: Patient will be able to void spontaneously with PVR of less than 100 cc.   Outcome: Progressing     Problem: Endocrine  Goal: LTG: Prior D/C patient will demonstrate the ability to self manage diabetes at home and what to do when symptoms arise.  Outcome: Progressing     Problem: Pain Management  Goal: LTG: Pain will verbalized adequate pain control with scheduled/ current pain medication prior to D/C.  Outcome: Progressing     Problem: Safety Risk  Goal: LTG: Patient will call for assistance for out of bed activities and remain free from falls through hospitalization.  Outcome: Progressing     Problem: Compromised Tissue integrity  Goal: Damaged tissue is healing and protected  Outcome: Progressing  Goal: Nutritional status is improving  Outcome: Progressing     Problem: Self Care Management  Goal: LTG: Pt will complete all ADLs and household transfers with mod I and AD/DME/AE PRN  Outcome: Progressing     Problem: Mobility  Goal: LTG: Pt will be mod I for navigating community distances and for curb step in order to safely enter home.   Outcome: Progressing

## 2021-03-19 NOTE — Group Note (Signed)
Rehab - OT Group  Inpatient Rehabilitation Group Note    Patient Name:  [name] [name]       Medical Record Number: [id number]   Date of Birth: [birth year]  Sex: Female          Rehab Diagnosis: Lumbar compression fracture, closed, initial encounter [S32.000A]     Today's Date: 03/19/2021   Group Start Time: 1300   Group End Time: 1400   Group Facilitators: Grayland Jack, OT  Group Topic: Rehab - OT Group  Discipline Led: OT  Number of Patient Participants: 3      Treatment Provided  Session focused on gross strengthening, improving CV endurance to improve performance during f/t towards ADLs, selfcare and education on sleep hygiene. Body weight Seated exercises included scap retraction, shoulder shrugs, shoulder circles, forward punches, seated marches, ankle pumps, glute squeezes, w/c push ups to improve gross strength 2/2 increased reliance on UB for f/ts and mobility. Pt. Participated in gentle neck and UE stretches for improved comfort and cool down. Pt. Participated in discussion of sleep hygiene including: routine, spending less time in bed during the day, relaxing activities prior to bed to improve quality of sleep at time to facilitate improved healing and rest. All exercises within spinal precautions. Therapist transporting pt. Back to room post session, pt. Performed w/c>EOB using RW and EOB>semi sup using log roll - CGA. Pt. Demo'd flat affect throughout session.   See Education Activity for details.    Assessment  Benefited from support and encouragement of peers in a social setting  Benefited from psychosocial stimulation in a group setting

## 2021-03-19 NOTE — Plan of Care (Signed)
Problem: Bladder Management  Goal: LTG: Patient will be able to void spontaneously with PVR of less than 100 cc.   03/19/2021 0239 by Samule Dry, RN  Outcome: Progressing  03/19/2021 0237 by Samule Dry, RN  Outcome: Progressing  03/19/2021 0237 by Samule Dry, RN  Outcome: Progressing  03/19/2021 0236 by Samule Dry, RN  Outcome: Progressing     Problem: Endocrine  Goal: LTG: Prior D/C patient will demonstrate the ability to self manage diabetes at home and what to do when symptoms arise.  03/19/2021 0239 by Samule Dry, RN  Outcome: Progressing  03/19/2021 0237 by Samule Dry, RN  Outcome: Progressing  03/19/2021 0237 by Samule Dry, RN  Outcome: Progressing  03/19/2021 0236 by Samule Dry, RN  Outcome: Progressing     Problem: Pain Management  Goal: LTG: Pain will verbalized adequate pain control with scheduled/ current pain medication prior to D/C.  03/19/2021 0239 by Samule Dry, RN  Outcome: Progressing  03/19/2021 0237 by Samule Dry, RN  Outcome: Progressing  03/19/2021 0237 by Samule Dry, RN  Outcome: Progressing  03/19/2021 0236 by Samule Dry, RN  Outcome: Progressing     Problem: Safety Risk  Goal: LTG: Patient will call for assistance for out of bed activities and remain free from falls through hospitalization.  03/19/2021 0239 by Samule Dry, RN  Outcome: Progressing  03/19/2021 0237 by Samule Dry, RN  Outcome: Progressing  03/19/2021 0237 by Samule Dry, RN  Outcome: Progressing  03/19/2021 0236 by Samule Dry, RN  Outcome: Progressing     Problem: Self Care Management  Goal: LTG: Pt will complete all ADLs and household transfers with mod I and AD/DME/AE PRN  Outcome: Progressing

## 2021-03-20 LAB — COMPREHENSIVE METABOLIC PANEL
ALT: 106 U/L — ABNORMAL HIGH (ref 0–55)
AST (SGOT): 65 U/L — ABNORMAL HIGH (ref 5–34)
Albumin/Globulin Ratio: 1.5 (ref 0.9–2.2)
Albumin: 3.2 g/dL — ABNORMAL LOW (ref 3.5–5.0)
Alkaline Phosphatase: 237 U/L — ABNORMAL HIGH (ref 37–117)
Anion Gap: 9 (ref 5.0–15.0)
BUN: 8 mg/dL (ref 7.0–19.0)
Bilirubin, Total: 0.4 mg/dL (ref 0.2–1.2)
CO2: 26 mEq/L (ref 22–29)
Calcium: 8.8 mg/dL (ref 8.5–10.5)
Chloride: 94 mEq/L — ABNORMAL LOW (ref 100–111)
Creatinine: 0.5 mg/dL — ABNORMAL LOW (ref 0.6–1.0)
Globulin: 2.2 g/dL (ref 2.0–3.6)
Glucose: 101 mg/dL — ABNORMAL HIGH (ref 70–100)
Potassium: 4.6 mEq/L (ref 3.5–5.1)
Protein, Total: 5.4 g/dL — ABNORMAL LOW (ref 6.0–8.3)
Sodium: 129 mEq/L — ABNORMAL LOW (ref 136–145)

## 2021-03-20 LAB — GLUCOSE WHOLE BLOOD - POCT
Whole Blood Glucose POCT: 117 mg/dL — ABNORMAL HIGH (ref 70–100)
Whole Blood Glucose POCT: 143 mg/dL — ABNORMAL HIGH (ref 70–100)
Whole Blood Glucose POCT: 131 mg/dL — ABNORMAL HIGH (ref 70–100)
Whole Blood Glucose POCT: 182 mg/dL — ABNORMAL HIGH (ref 70–100)

## 2021-03-20 LAB — GFR: EGFR: >60

## 2021-03-20 MED ORDER — ALUM & MAG HYDROXIDE-SIMETH 200-200-20 MG/5ML PO SUSP
30.0000 mL | ORAL | Status: DC | PRN
Start: 2021-03-20 — End: 2021-03-29

## 2021-03-20 MED ORDER — LISINOPRIL 5 MG PO TABS
2.5000 mg | ORAL_TABLET | Freq: Two times a day (BID) | ORAL | Status: DC
Start: 2021-03-21 — End: 2021-03-29
  Administered 2021-03-21 – 2021-03-29 (×17): 2.5 mg via ORAL
  Filled 2021-03-20 (×17): qty 1

## 2021-03-20 MED ORDER — FAMOTIDINE 20 MG PO TABS
20.0000 mg | ORAL_TABLET | Freq: Every day | ORAL | Status: DC
Start: 2021-03-20 — End: 2021-03-29
  Administered 2021-03-20 – 2021-03-28 (×9): 20 mg via ORAL
  Filled 2021-03-20 (×9): qty 1

## 2021-03-20 NOTE — PT Progress Note (Signed)
Physical Therapy  Inpatient Rehabilitation Daily Progress Note    Patient Name:  [name] [name]       Medical Record Number: [id number]   Date of Birth: August 26, 1951  Sex: Female          Room/Bed:  M502/M502-01    Rehabilitation Precautions/Restrictions:  Back Brace Applied: OOB ([place] [facility])  Spinal Precautions: no bending, no twisting, no lifting    Rehab Diagnosis: Lumbar compression fracture, closed, initial encounter [S32.000A]     Subjective   Patient Report: Pt reporting she has two children who live in [place] and one who lives here; pt drove here to visit her daughter and her car is still here.  Patient/Caregiver Goals: Be in less pain and be able to move.  Pain Assessment:  Pain Assessment: Numeric Scale (0-10)  Pain Score: 6-moderate pain (Lower back - ongoing.)  Pain Intervention(s): Cold applied    Objective   Vitals:  Heart Rate: 100  Pulse (SpO2): 93  BP: (!) 82/45  MAP (mmHg): (!) 58    Interventions:   Pt supine in bed, appreciate supervising PT handoff.  Pt agreeable to mobility with gentle cues for encouragement.  Pt able to state all spinal precautions when prompted.  Supervision log rolling and all bed mobility.   SBA STS with FWW.  Pt ambulates 15' and 120' using FWW with SBA.  In bathroom pt manages LB clothing with SBA and use of L grab bar.  SBA static standing to wash hands.   Pt completes 5 minutes on NuStep level 0 BUE/BLE.  Vitals assessed at rest break; no s/s of low BP however see vtials above.  Pt brought back to room and RN notified.  Pt SBA to get back in bed.   In side lying, ice pack applied to back, TED hose donned, and pillow placed between knees for proper alignment.         Education Documentation  Reconditioning, taught by Ladene Artist, LPTA at 03/20/2021  2:00 PM.  Learner: Patient  Readiness: Acceptance  Method: Explanation, Teach Back  Response: [name] Understanding, Needs Reinforcement    Pain management, taught by Ladene Artist, LPTA at 03/20/2021   2:00 PM.  Learner: Patient  Readiness: Acceptance  Method: Explanation, Teach Back  Response: [name] Understanding, Needs Reinforcement    Home management activities, taught by Ladene Artist, LPTA at 03/20/2021  2:00 PM.  Learner: Patient  Readiness: Acceptance  Method: Explanation, Teach Back  Response: Verbalizes Understanding, Needs Reinforcement    Education Comments  No comments found.         Assessment & Plan   Pt requires some cues for encouragement, and pt's primary focus is to get back to [place]. Pt was asymptomatic with low BP, however for safety pt brought back to room. Pt requires ongoing therapy to improve CV function and mobility for return to independence.    Plan:  Risks/Benefits/POC Discussed with Pt/Family: With patient  Treatment/Interventions: Exercise, Gait training, Stair training, Neuromuscular re-education, Functional transfer training, LE strengthening/ROM, Endurance training, Patient/family training, Equipment eval/education    Recommendation:  Discharge Recommendation: Home with home health PT  PT Therapy Recommendation: 5-6 days/week, 60-120 mins/day, 1:1 treatment  PT Estimated Length of Stay: 10 days from IE on 03/17/21

## 2021-03-20 NOTE — Plan of Care (Signed)
Problem: Bladder Management  Goal: LTG: Patient will be able to void spontaneously with PVR of less than 100 cc.   Outcome: Progressing     Problem: Safety Risk  Goal: LTG: Patient will call for assistance for out of bed activities and remain free from falls through hospitalization.  Outcome: Progressing     Problem: Pain Management  Goal: LTG: Pain will verbalized adequate pain control with scheduled/ current pain medication prior to D/C.  Outcome: Progressing

## 2021-03-20 NOTE — PT Progress Note (Signed)
Physical Therapy  Inpatient Rehabilitation Daily Progress Note    Patient Name:  [name] [name]       Medical Record Number: [id number]   Date of Birth: Feb 13, 1951  Sex: Female          Room/Bed:  M502/M502-01    Rehabilitation Precautions/Restrictions:  Back Brace Applied: OOB  Spinal Precautions: no bending, no twisting, no lifting    Rehab Diagnosis: Lumbar compression fracture, closed, initial encounter [S32.000A]     Subjective   Patient Report: "I'm in a lot of pain and I did a lot this morning."  Patient/Caregiver Goals: Be in less pain and be able to move.  Pain Assessment:      Objective   Vitals:      Interventions: Pt supine in bed when PT arrived. Pt initially not agreeable to therapy. PT practiced therapeutic listening and goalsetting with patient. PT provided education on role of PT and OT in recovery. PT provided education on pain and pain management.    Evaluation:                  Assessments:      Education Documentation  No documentation found.  Education Comments  No comments found.         Assessment & Plan   Pt seemed very emotional over how the last few years have gone for her. She also does not know her d/c plan. She discussed home to [place] via plane vs car or staying here in extended stay with her dtr. Pt was receptive to education and stated that she appreciated it. Continue POC to progress to mobility training in prep for d/c home.     Plan:  Risks/Benefits/POC Discussed with Pt/Family: With patient  Treatment/Interventions: Exercise, Gait training, Stair training, Neuromuscular re-education, Functional transfer training, LE strengthening/ROM, Endurance training, Patient/family training, Equipment eval/education    Recommendation:  Discharge Recommendation: Home with home health PT  PT Therapy Recommendation: 5-6 days/week, 60-120 mins/day, 1:1 treatment  PT Estimated Length of Stay: 10 days from IE on 03/17/21

## 2021-03-20 NOTE — Plan of Care (Signed)
Problem: Bladder Management  Goal: LTG: Patient will be able to void spontaneously with PVR of less than 100 cc.   Outcome: Adequate for Discharge    Patient now continent of urine day and night time.      Problem: Endocrine  Goal: LTG: Prior D/C patient will demonstrate the ability to self manage diabetes at home and what to do when symptoms arise.  Outcome: Progressing    Blood sugars fluctuate. On oral (glucophage) agent, Lantus scheduled + Lispro sliding scale coverage.     Problem: Pain Management  Goal: LTG: Pain will verbalized adequate pain control with scheduled/ current pain medication prior to D/C.  Outcome: Progressing    Reports back pain. Wears back brace and receives prn Tylenol and Oxy IR.       Problem: Safety Risk  Goal: LTG: Patient will call for assistance for out of bed activities and remain free from falls through hospitalization.  Outcome: Progressing    Pt is at the yellow level for falls risk. She does consistently call for staff assistance with mobility and care needs.     Problem: Compromised Tissue integrity  Goal: Damaged tissue is healing and protected  Outcome: Progressing    Scattered bruising from heparin shots.      Problem: Compromised Tissue integrity  Goal: Nutritional status is improving  Outcome: Progressing    Fluids and meals po in qs amounts.

## 2021-03-20 NOTE — Group Note (Signed)
Rehab - Horticultural  Inpatient Rehabilitation Group Note    Patient Name:  [name] [name]       Medical Record Number: [id number]   Date of Birth: 30-Mar-1951  Sex: Female          Rehab Diagnosis: Lumbar compression fracture, closed, initial encounter [S32.000A]     Today's Date: 03/20/2021   Group Start Time: 1000   Group End Time: 1100   Group Facilitators: Roselyn Bering  Group Topic: Rehab - Horticultural  Discipline Led: Therapeutic Recreation    Number of Patient Participants: 4      Treatment Provided  Horticultural Therapy    See Education Activity for details.    Assessment  Participant(s) had good participation  Benefited from psychosocial stimulation in an outdoor setting  Benefited from psychosocial stimulation in a group setting  Socialized with peer and therapists present

## 2021-03-20 NOTE — Group Note (Signed)
Rehab - OT Group  Inpatient Rehabilitation Group Note    Patient Name:  [name] [name]       Medical Record Number: [id number]   Date of Birth: 08-07-1951  Sex: Female          Rehab Diagnosis: Lumbar compression fracture, closed, initial encounter [S32.000A]     Today's Date: 03/20/2021   Group Start Time: 1100   Group End Time: 1200   Group Facilitators: Davonna Belling, OT  Group Topic: Rehab - OT Group  Discipline Led: Occupational Therapy  Number of Patient Participants: 3      Treatment Provided  Pt engaged in UB exercises/endurance training. OT guided pt through variety of exercises targeting UB. Pt completed 3 sets of 30 seconds of forward punches, forward punches with resistance, speed bag punches, wheelchair push-ups, and BUE external rotation using theraband for resistance. VC for improving movement quality and ROM.     Additionally completed AROM exercises focusing on shoulder mobility. Pt guided through range of stretches targeting BUE and BLE with exception of trunk due to spinal precautions.    Rest of session pt completed breathing/meditation exercises with group.    See Education Activity for details.    Assessment  Benefited from support and encouragement of peers in a social setting  Participant(s) had good participation  Benefited from psychosocial stimulation in a group setting

## 2021-03-20 NOTE — Progress Notes (Signed)
PHYSICAL MEDICINE AND REHABILITATION  PROGRESS NOTE -- FACE-TO-FACE ENCOUNTER    Date Time: 03/20/21 3:55 PM  Patient Name: [name] [name],[name] [name]    Admission date:  03/16/2021    Subjective:     No new pain.  No problems with bowel or bladder.  Sleeping adequately.  Eating and drinking fairly well.  Reports being tired.  Participating with therapies.    Noted low blood pressure earlier today.    Functional Status:     Pt engaged in UB exercises/endurance training. OT guided pt through variety of exercises targeting UB. Pt completed 3 sets of 30 seconds of forward punches, forward punches with resistance, speed bag punches, wheelchair push-ups, and BUE external rotation using theraband for resistance. VC for improving movement quality and ROM.    Additionally completed AROM exercises focusing on shoulder mobility. Pt guided through range of stretches targeting BUE and BLE with exception of trunk due to spinal precautions.     Medications:   Medication reviewed by me:     Scheduled Meds: PRN Meds:   calcium citrate-vitamin D, 1 tablet, Oral, Daily with lunch  heparin (porcine), 5,000 Units, Subcutaneous, Q8H SCH  insulin glargine, 10 Units, Subcutaneous, QHS  insulin lispro, 1-3 Units, Subcutaneous, QHS  insulin lispro, 1-5 Units, Subcutaneous, TID AC  letrozole, 2.5 mg, Oral, Daily  lisinopril, 10 mg, Oral, Daily  metFORMIN, 500 mg, Oral, BID Meals  nicotine, 1 patch, Transdermal, Daily  senna-docusate, 2 tablet, Oral, QHS  tiotropium, 2 puff, Inhalation, QAM  vitamin D (cholecalciferol), 25 mcg, Oral, Daily      Continuous Infusions:   acetaminophen, 1,000 mg, Q6H PRN  alum & mag hydroxide-simethicone, 30 mL, Q4H PRN  bisacodyl, 10 mg, Daily PRN  bisacodyl, 10 mg, Daily PRN  glucagon (rDNA), 1 mg, PRN   And  dextrose, 250 mL, PRN   And  dextrose, 25 g, PRN   And  dextrose, 25 g, PRN  melatonin, 3 mg, QHS PRN  ondansetron, 4 mg, Q8H PRN   Or  ondansetron, 4 mg, Q8H PRN  oxyCODONE, 10 mg, Q4H PRN  oxyCODONE, 5 mg, Q4H  PRN  polyethylene glycol, 17 g, Daily PRN  sodium phosphate, 1 enema, Daily PRN        Medication Review  A complete drug regimen review was completed: Yes  Were any drug issues found during review?: No          Review of Systems:   A comprehensive review of systems was: No fevers, chills, nausea, vomiting, chest pain, shortness of breath, cough, headache, double vision.  All others negative.    Physical Exam:     Vitals:    03/20/21 0838 03/20/21 1149 03/20/21 1446 03/20/21 1527   BP: 104/65 95/56 (!) 82/45 116/64   Pulse: 99 96 100 91   Resp: 20      Temp: 98.7 F (37.1 C)      TempSrc:       SpO2: 92%  93% 93%   Weight:       Height:           Intake and Output Summary (Last 24 hours) at Date Time    Intake/Output Summary (Last 24 hours) at 03/20/2021 1555  Last data filed at 03/20/2021 0701  Gross per 24 hour   Intake 480 ml   Output --   Net 480 ml       P.O.: 240 mL (03/20/21 0701)     Urine: 150 mL (03/18/21 0830)  Bladder Scan  Volume (mL): 108 mL (03/19/21 0556)        No acute distress.    Chest rhythmic movement of chest with normal respirations.  Abdomen nondistended    Labs:   No results for input(s): GLUCOSEWHOLE in the last 24 hours.    Recent Labs   Lab 03/19/21  0608 03/17/21  0515   WBC 6.58 6.72   Hgb 13.2 13.1   Hematocrit 39.5 39.2   Platelets 299 209          Recent Labs   Lab 03/20/21  0607 03/19/21  0608 03/17/21  0515   Sodium 129* 129* 132*   Potassium 4.6 4.2 4.4   Chloride 94* 91* 96*   CO2 26 28 27    BUN 8.0 8.0 9.0   Creatinine 0.5* 0.6 0.5*   Calcium 8.8 8.7 8.8   Albumin 3.2* 3.2* 3.1*   Protein, Total 5.4* 5.5* 5.2*   Bilirubin, Total 0.4 0.5 0.3   Alkaline Phosphatase 237* 234* 91   ALT 106* 118* 25   AST (SGOT) 65* 100* 23   Glucose 101* 135* 159*               Results       Procedure Component Value Units Date/Time    Glucose Whole Blood - POCT [[id number]]  (Abnormal) Collected: 03/20/21 1155     Updated: 03/20/21 1157     Whole Blood Glucose POCT 143 mg/dL     Comprehensive  metabolic panel [[id number]]  (Abnormal) Collected: 03/20/21 0607    Specimen: Blood Updated: 03/20/21 0754     Glucose 101 mg/dL      BUN 8.0 mg/dL      Creatinine 0.5 mg/dL      Sodium 782 mEq/L      Potassium 4.6 mEq/L      Chloride 94 mEq/L      CO2 26 mEq/L      Calcium 8.8 mg/dL      Protein, Total 5.4 g/dL      Albumin 3.2 g/dL      AST (SGOT) 65 U/L      ALT 106 U/L      Alkaline Phosphatase 237 U/L      Bilirubin, Total 0.4 mg/dL      Globulin 2.2 g/dL      Albumin/Globulin Ratio 1.5     Anion Gap 9.0    GFR [[id number]] Collected: 03/20/21 0607     Updated: 03/20/21 0754     EGFR >60.0       Glucose Whole Blood - POCT [[id number]]  (Abnormal) Collected: 03/20/21 0604     Updated: 03/20/21 0610     Whole Blood Glucose POCT 117 mg/dL     Glucose Whole Blood - POCT [[id number]]  (Abnormal) Collected: 03/19/21 2041     Updated: 03/19/21 2045     Whole Blood Glucose POCT 233 mg/dL     Glucose Whole Blood - POCT [[id number]]  (Abnormal) Collected: 03/19/21 1614     Updated: 03/19/21 1622     Whole Blood Glucose POCT 155 mg/dL                  Rads:   Radiological Procedure reviewed.  Radiology Results (24 Hour)       ** No results found for the last 24 hours. **                Assessment and Plan:     Ambulation and activities of daily living dysfunction  L1 and T11 compression fractures  -- Follow progress towards goals with PT and OT.    -- we will have a team conference tomorrow.    Transaminitis  -- 6/20 patient is taking Tylenol 1000 mg perhaps once a day as needed for pain.  We will discontinue Crestor 40 mg p.o. nightly.  Follow daily CMP's for now.  -- 6/21 improving trend    Hypertension  -- Continue regimen.  Continue to monitor.  Some lability noted.  -- 6/21 low blood pressure noted.  Currently on lisinopril 10 mg daily.  Will change lisinopril to 2.5 mg twice daily.    Hypercholesterolemia  -- 6/20 hold Crestor 40 mg nightly because of elevated LFTs.  Continue to follow LFTs.    Diabetes mellitus type  II with hyperglycemia  --6/20: Continue to monitor.  Fairly stable.    Hyponatremia  --6/20 possibly due to oral intake.  Continue to monitor.  Encourage oral intake.  Follow daily CMP for now.  --6/21 sodium level stable 129.  Patient reports that as an outpatient she was on fluid restriction which she observing now.    COPD  -- No complaints of shortness of breath.  Follow trend    History of lung cancer    History of breast cancer    History of nicotine dependence     DVT prophylaxis: Ambulation and heparin 5000 units subcu every 8 hours  --6/19: Negative bilateral lower limb venous duplex for DVT    Patient Active Problem List   Diagnosis    Lumbar compression fracture, closed, initial encounter       Continue comprehensive and intensive inpatient rehab program, including:   Physical therapy 60-120 min daily, 5-6 times per week, Occupational therapy 60-120 min daily, 5-6 times per week, Case management and Rehabilitation nursing    Signed by: Virl Cagey, MD    [facility]    If there are questions or concerns about the content of this note or information contained within the body of this dictation they should be addressed directly with the author for clarification.

## 2021-03-21 LAB — SODIUM, URINE, RANDOM: Urine Sodium Random: 62 mEq/L

## 2021-03-21 LAB — GLUCOSE WHOLE BLOOD - POCT
Whole Blood Glucose POCT: 131 mg/dL — ABNORMAL HIGH (ref 70–100)
Whole Blood Glucose POCT: 222 mg/dL — ABNORMAL HIGH (ref 70–100)
Whole Blood Glucose POCT: 128 mg/dL — ABNORMAL HIGH (ref 70–100)
Whole Blood Glucose POCT: 166 mg/dL — ABNORMAL HIGH (ref 70–100)

## 2021-03-21 LAB — OSMOLALITY, URINE: Urine Osmolality: 597 mosm/kg (ref 300–1094)

## 2021-03-21 MED ORDER — SODIUM CHLORIDE 1 G PO TABS
1.0000 g | ORAL_TABLET | Freq: Two times a day (BID) | ORAL | Status: DC
Start: 2021-03-21 — End: 2021-03-28
  Administered 2021-03-21 – 2021-03-28 (×14): 1 g via ORAL
  Filled 2021-03-21 (×14): qty 1

## 2021-03-21 NOTE — Consults (Addendum)
CONSULTATION    Date Time: 03/21/21 4:32 PM  Patient Name: [name] [name]  Requesting Physician: Virl Cagey, MD      Reason for Consultation:     Hyponatremia      Assessment:     Hyponatremia. 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  Essential hypertension.  Blood pressure controlled   Diabetes mellitus  Hypoalbuminemia    Plan:     Check urine sodium  Check urine osmolality  Start on sodium chloride 1 gm BID   We will try to get her medical records from [facility]  Asked her to restrict her fluid intake to 1.5 L of water in 24 hours  Check serum uric acid levels  High-protein diet  Continue current blood pressure lowering medication  Sugar control  Physical therapy in progress  Fall precautions  Renal panel in the morning  Acute renal failure consult.  Follow the patient      History:     This is a 70 year old lady with past medical history of diabetes mellitus, essential hypertension history of lung cancer and COPD.  Chronic lower back pain.  Found to have compression fracture.  Currently she is in [facility] acute rehab.  Nephrology asked to see her for her worsening hyponatremia.  She denies any nausea, vomiting diarrhea.  In addition there is no diuretics on board.  No NSAID history.  Appears that she went to [facility].  Her serum sodium was 120 mEq on admission but it dropped to 118 mEq.    Past Medical History:     Past Medical History:   Diagnosis Date    Breast cancer     COPD (chronic obstructive pulmonary disease)     Diabetes mellitus     Hypertension     Lung cancer        Past Surgical History:     Past Surgical History:   Procedure Laterality Date    BREAST LUMPECTOMY         Family History:   No family history on file.    Social History:      Social History     Socioeconomic History    Marital status: Divorced     Spouse name: Not on file    Number of children: Not on file    Years of education: Not on file    Highest education level: Not on file   Occupational History    Not on file   Tobacco Use    Smoking status: Former     Pack years: 0.00     Types: Cigarettes     Passive exposure: Past    Smokeless tobacco: Not on file   Vaping Use    Vaping Use: Never used   Substance and Sexual Activity    Alcohol use: Not Currently    Drug use: Never    Sexual activity: Never   Other Topics Concern    Not on file   Social History Narrative    Not on file     Social Determinants of Health     Financial Resource Strain: Not on file   Food Insecurity: Not on file   Transportation Needs: Not on file   Physical Activity: Not  on file   Stress: Not on file   Social Connections: Not on file   Intimate Partner Violence: Not on file   Housing Stability: Not on file       Allergies:     Allergies   Allergen Reactions    Erythromycin Itching and Rash       Medications:     Current Facility-Administered Medications   Medication Dose Route Frequency    calcium citrate-vitamin D  1 tablet Oral Daily with lunch    famotidine  20 mg Oral Daily with dinner    heparin (porcine)  5,000 Units Subcutaneous Q8H SCH    insulin glargine  10 Units Subcutaneous QHS    insulin lispro  1-3 Units Subcutaneous QHS    insulin lispro  1-5 Units Subcutaneous TID AC    letrozole  2.5 mg Oral Daily    lisinopril  2.5 mg Oral Q12H SCH    metFORMIN  500 mg Oral BID Meals    nicotine  1 patch Transdermal Daily    senna-docusate  2 tablet Oral QHS    tiotropium  2 puff Inhalation QAM    vitamin D (cholecalciferol)  25 mcg Oral Daily       Review of Systems:     Has no shortness of breath or chest pain.   No history of fever, rigor or chills   No history of NSAIDs.   Has no UTI symptoms   No rashes or bruises   Has no cough, sputum  No bloody or frothy urine.   No history of excessive thirst or  sweating.  No diarrhea.   No chest pain or palpitation  No abd pain, nausea or vomiting  No swelling  No joint symptoms  No headache, visual changes, focal neurological symptoms  No psych problems  All other systems reviewed and negative for new problems  Physical Exam:     Vitals:    03/21/21 1626   BP: 144/80   Pulse: 84   Resp:    Temp: 98.4 F (36.9 C)   SpO2: 93%       Intake and Output Summary (Last 24 hours) at Date Time    Intake/Output Summary (Last 24 hours) at 03/21/2021 1632  Last data filed at 03/21/2021 0900  Gross per 24 hour   Intake 440 ml   Output 0 ml   Net 440 ml         General appearance -appears to be chronically sick lady.  Low muscle mass and low body mass index  Mental status - alert, oriented to person, place, and time  HEENT unremarkable  Mouth - mucous membranes moist, pharynx normal without lesions  Neck - supple, no LN, thyroid  Chest - clear to auscultation, no wheezes, rales or rhonchi.  Heart - normal rate, regular rhythm, normal S1, S2, no murmurs, rubs.  Abdomen - soft, nontender, nondistended, no masses or organomegaly  bowel sounds normal  No abdominal bruits  No leg edema  No skin rashes, or bruises.   Distal pulses palpable.        Labs Reviewed:     Results       Procedure Component Value Units Date/Time    Glucose Whole Blood - POCT [[id number]]  (Abnormal) Collected: 03/21/21 1159     Updated: 03/21/21 1201     Whole Blood Glucose POCT 222 mg/dL     Glucose Whole Blood - POCT [[id number]]  (Abnormal) Collected: 03/21/21 7829  Updated: 03/21/21 0620     Whole Blood Glucose POCT 131 mg/dL     Glucose Whole Blood - POCT [[id number]]  (Abnormal) Collected: 03/20/21 2137     Updated: 03/20/21 2143     Whole Blood Glucose POCT 182 mg/dL             Recent Labs     03/19/21  0608   WBC 6.58   Hgb 13.2   Hematocrit 39.5   Platelets 299       Recent Labs     03/20/21  0607 03/19/21  0608   Sodium 129* 129*   Potassium 4.6 4.2   Chloride 94* 91*   CO2 26 28   BUN 8.0 8.0   Creatinine  0.5* 0.6   Glucose 101* 135*   Calcium 8.8 8.7       Recent Labs     03/20/21  0607 03/19/21  0608   AST (SGOT) 65* 100*   ALT 106* 118*   Alkaline Phosphatase 237* 234*   Protein, Total 5.4* 5.5*   Albumin 3.2* 3.2*            Signed by: Cristal Ford, MD, MD

## 2021-03-21 NOTE — OT Progress Note (Signed)
Occupational Therapy  Inpatient Rehabilitation Daily Progress Note    Patient Name:  [name] [name]       Medical Record Number: [id number]   Date of Birth: [birth year]  Sex: Female          Room/Bed:  M502/M502-01    Rehabilitation Precautions/Restrictions:  Weight Bearing Status: no restrictions  Back Brace Applied: OOB  Spinal Precautions: no bending, no twisting, no lifting    Rehab Diagnosis: Lumbar compression fracture, closed, initial encounter [S32.000A]     Subjective   Patient Report: "I honestly feel like I could do everything if this pain would just go away."  Patient/Caregiver Goals: Be in less pain and be able to move.    Objective   Vitals:  Heart Rate: (!) 104  BP: 120/76  BP Location: Left arm  MAP (mmHg): 91  Patient Position: Sitting    Interventions:   Pt received seated upright in w/c with direct handoff from previous OT. RN reports pt is due for pain medication, however due to low BP earlier, requested to withhold pain medication until after therapy in order to prevent OH. Pt agreeable. Pt transported to OT gym and therapist attempted to engage pt in collaborative goal setting using rehab binder to promote patient-centered care and improved engagement in rehab process. However, pt reports feeling confident in ability to complete basic self-care routine independently, household ambulation, and basic IADLs with intention to rely on microwave meals, delivery services, and daughter for assistance with cleaning at discharge. Pt denied having additional hobbies to return to, expressing primary concern as reducing pain. Pt educated on pain management strategies, biopsychosocial pain theory, and clinical assessment of current strengths/deficits for home. Pt in agreement regarding primary concerns of standing/activity tolerance and generalized deconditioning. Pt transitioned to household transfer training, completing tub transfer 2x via step-in method with hand on wall for support and CGA. No LOB  observed. Pt trialed toilet transfer without AD/DME, requiring min A to return to standing from low surface and significant effort. Pt educated on impact of excess reliance on UEs in overall deconditioning of legs. Oriented to toilet safety frame and pt demonstrated independence with use. Pt then ended session with STS 10x without UEs, benefiting from cues for technique as well as mirror for visual feedback to reduce guarding posture. Pt reported significant pain and fatigue at end of session. RN notified and provided medication.    Assessment & Plan   Pt demonstrates limited strength and activity tolerance, likely baseline and exacerbated by pain resulting in sedentary lifestyle. Pt will benefit from exploration of meaningful occupations for improved coping as well as ongoing strengthening/reduced reliance on compensatory strategies.    Plan:  Risks/Benefits/POC Discussed with Pt/Family: With patient  Treatment Interventions: ADL retraining, Functional transfer training, UE strengthening/ROM, Endurance training, Patient/Family training, Equipment eval/education, Compensatory technique education    Recommendation:  Discharge Recommendation: Home with supervision, Home with home health OT  OT Therapy Recommendation: 5-6 days/week, 60-120 mins/day  OT Estimated Length of Stay: 10-14 days from IE (03/17/21)

## 2021-03-21 NOTE — Progress Notes (Signed)
PHYSICAL MEDICINE AND REHABILITATION  PROGRESS NOTE -- FACE-TO-FACE ENCOUNTER    Date Time: 03/21/21 2:43 PM  Patient Name: [name] [name],[name] [name]    Admission date:  03/16/2021    Subjective:     I rounded with the RN.    Appetite is only fair.  No problems with sleeping.  Pain is fairly controlled.    Blood pressures seem to be better.    Functional Status:     Vitals:  Heart Rate: (!) 104  BP: 120/76  BP Location: Left arm  MAP (mmHg): 91  Patient Position: Sitting     Interventions:   Pt received seated upright in w/c with direct handoff from previous OT. RN reports pt is due for pain medication, however due to low BP earlier, requested to withhold pain medication until after therapy in order to prevent OH. Pt agreeable. Pt transported to OT gym and therapist attempted to engage pt in collaborative goal setting using rehab binder to promote patient-centered care and improved engagement in rehab process. However, pt reports feeling confident in ability to complete basic self-care routine independently, household ambulation, and basic IADLs with intention to rely on microwave meals, delivery services, and daughter for assistance with cleaning at discharge. Pt denied having additional hobbies to return to, expressing primary concern as reducing pain. Pt educated on pain management strategies, biopsychosocial pain theory, and clinical assessment of current strengths/deficits for home. Pt in agreement regarding primary concerns of standing/activity tolerance and generalized deconditioning. Pt transitioned to household transfer training, completing tub transfer 2x via step-in method with hand on wall for support and CGA. No LOB observed. Pt trialed toilet transfer without AD/DME, requiring min A to return to standing from low surface and significant effort. Pt educated on impact of excess reliance on UEs in overall deconditioning of legs. Oriented to toilet safety frame and pt demonstrated independence with use. Pt then  ended session with STS 10x without UEs, benefiting from cues for technique as well as mirror for visual feedback to reduce guarding posture. Pt reported significant pain and fatigue at end of session. RN notified and provided medication.     Medications:   Medication reviewed by me:     Scheduled Meds: PRN Meds:   calcium citrate-vitamin D, 1 tablet, Oral, Daily with lunch  famotidine, 20 mg, Oral, Daily with dinner  heparin (porcine), 5,000 Units, Subcutaneous, Q8H SCH  insulin glargine, 10 Units, Subcutaneous, QHS  insulin lispro, 1-3 Units, Subcutaneous, QHS  insulin lispro, 1-5 Units, Subcutaneous, TID AC  letrozole, 2.5 mg, Oral, Daily  lisinopril, 2.5 mg, Oral, Q12H SCH  metFORMIN, 500 mg, Oral, BID Meals  nicotine, 1 patch, Transdermal, Daily  senna-docusate, 2 tablet, Oral, QHS  tiotropium, 2 puff, Inhalation, QAM  vitamin D (cholecalciferol), 25 mcg, Oral, Daily      Continuous Infusions:   acetaminophen, 1,000 mg, Q6H PRN  alum & mag hydroxide-simethicone, 30 mL, Q4H PRN  bisacodyl, 10 mg, Daily PRN  bisacodyl, 10 mg, Daily PRN  glucagon (rDNA), 1 mg, PRN   And  dextrose, 250 mL, PRN   And  dextrose, 25 g, PRN   And  dextrose, 25 g, PRN  melatonin, 3 mg, QHS PRN  ondansetron, 4 mg, Q8H PRN   Or  ondansetron, 4 mg, Q8H PRN  oxyCODONE, 10 mg, Q4H PRN  oxyCODONE, 5 mg, Q4H PRN  polyethylene glycol, 17 g, Daily PRN  sodium phosphate, 1 enema, Daily PRN        Medication Review  A complete  drug regimen review was completed: Yes  Were any drug issues found during review?: No          Review of Systems:   A comprehensive review of systems was: No fevers, chills, nausea, vomiting, chest pain, shortness of breath, cough, headache, double vision.  All others negative.    Physical Exam:     Vitals:    03/21/21 1111 03/21/21 1137 03/21/21 1317 03/21/21 1358   BP: 125/72 120/76 112/66 134/72   Pulse: 100 (!) 104 (!) 103 98   Resp:       Temp:       TempSrc:       SpO2: 92% 94% 93% 93%   Weight:       Height:            Intake and Output Summary (Last 24 hours) at Date Time    Intake/Output Summary (Last 24 hours) at 03/21/2021 1443  Last data filed at 03/20/2021 1700  Gross per 24 hour   Intake 240 ml   Output --   Net 240 ml       P.O.: 240 mL (03/20/21 1700)     Urine: 150 mL (03/18/21 0830)  Bladder Scan Volume (mL): 108 mL (03/19/21 0556)        No acute distress.    Chest rhythmic movement of chest with normal respirations.  Abdomen nondistended    Labs:   No results for input(s): GLUCOSEWHOLE in the last 24 hours.    Recent Labs   Lab 03/19/21  0608 03/17/21  0515   WBC 6.58 6.72   Hgb 13.2 13.1   Hematocrit 39.5 39.2   Platelets 299 209          Recent Labs   Lab 03/20/21  0607 03/19/21  0608 03/17/21  0515   Sodium 129* 129* 132*   Potassium 4.6 4.2 4.4   Chloride 94* 91* 96*   CO2 26 28 27    BUN 8.0 8.0 9.0   Creatinine 0.5* 0.6 0.5*   Calcium 8.8 8.7 8.8   Albumin 3.2* 3.2* 3.1*   Protein, Total 5.4* 5.5* 5.2*   Bilirubin, Total 0.4 0.5 0.3   Alkaline Phosphatase 237* 234* 91   ALT 106* 118* 25   AST (SGOT) 65* 100* 23   Glucose 101* 135* 159*               Results       Procedure Component Value Units Date/Time    Glucose Whole Blood - POCT [[id number]]  (Abnormal) Collected: 03/21/21 1159     Updated: 03/21/21 1201     Whole Blood Glucose POCT 222 mg/dL     Glucose Whole Blood - POCT [[id number]]  (Abnormal) Collected: 03/21/21 0612     Updated: 03/21/21 0620     Whole Blood Glucose POCT 131 mg/dL     Glucose Whole Blood - POCT [[id number]]  (Abnormal) Collected: 03/20/21 2137     Updated: 03/20/21 2143     Whole Blood Glucose POCT 182 mg/dL     Glucose Whole Blood - POCT [[id number]]  (Abnormal) Collected: 03/20/21 1612     Updated: 03/20/21 1616     Whole Blood Glucose POCT 131 mg/dL                  Rads:   Radiological Procedure reviewed.  Radiology Results (24 Hour)       ** No results found for the last 24 hours. **  Assessment and Plan:     Ambulation and activities of daily living  dysfunction    L1 and T11 compression fractures  -- Continue with PT and OT.    -- Estimated length of stay to go home if medically stable is June 30  -- We had a team conference today.  Please see team conference note for details.    Transaminitis  -- 6/20 patient is taking Tylenol 1000 mg perhaps once a day as needed for pain.  We will discontinue Crestor 40 mg p.o. nightly.  Follow daily CMP's for now.  -- 6/21 improving trend  --6/22 recheck a.m. CMP.    Hypertension  -- Continue regimen.  Continue to monitor.  Some lability noted.  -- 6/21 low blood pressures noted.  Currently on lisinopril 10 mg daily.  Will change lisinopril to 2.5 mg twice daily.  -- 6/22 continue current lisinopril 2.5 mg twice daily.  Blood pressures are improved    Hypercholesterolemia  -- 6/20 hold Crestor 40 mg nightly because of elevated LFTs.  Continue to follow LFTs.    Diabetes mellitus type II with hyperglycemia  --6/20: Continue to monitor.  Fairly stable.    Hyponatremia  --6/20 possibly due to oral intake.  Continue to monitor.  Encourage oral intake.  Follow daily CMP for now.  --6/21 sodium level stable 129.  Patient reports that as an outpatient she was on fluid restriction which she observing now.  --6/22 consult nephrology.    COPD  -- No complaints of shortness of breath.  Follow trend    History of lung cancer    History of breast cancer    History of nicotine dependence     DVT prophylaxis: Ambulation and heparin 5000 units subcu every 8 hours  --6/19: Negative bilateral lower limb venous duplex for DVT    Patient Active Problem List   Diagnosis    Lumbar compression fracture, closed, initial encounter       Continue comprehensive and intensive inpatient rehab program, including:   Physical therapy 60-120 min daily, 5-6 times per week, Occupational therapy 60-120 min daily, 5-6 times per week, Case management and Rehabilitation nursing    Signed by: Virl Cagey, MD    [facility]    If  there are questions or concerns about the content of this note or information contained within the body of this dictation they should be addressed directly with the author for clarification.

## 2021-03-21 NOTE — Care and Service Plan (Signed)
Rehab Team Discussion    Patient Name:  [name] [name]       Medical Record Number: [id number]   Date of Birth: June 28, 1951  Sex: Female          Room/Bed:  M502/M502-01    Admitting Diagnosis: Lumbar compression fracture, closed, initial encounter [S32.000A]   Admit Date/Time:  03/16/2021  9:30 PM  Admission Comments: No comment available     Primary Diagnosis: Lumbar compression fracture, closed, initial encounter    Patient Active Problem List    Diagnosis Date Noted    Lumbar compression fracture, closed, initial encounter 03/16/2021       Vital Signs  Blood Pressure: 120/76  Temperature: 98.1 F (36.7 C)  Pulse: (!) 104  Respirations: 16  Pain Scale Used: Numeric Scale (0-10)  Pain Score: 5  Pain Location: Back  Pain Orientation: Lower  Pain Descriptors: Aching  Pain Frequency: Intermittent      Weight and Nutrition  Admission Weight: 60.7 kg (133 lb 13.1 oz)  Current Weight: 60 kg (132 lb 4.4 oz)  Diet Type: Consistent Carbohydrate, Cardiac, Thin Liquid    Plan of Care  Anticipated Discharge Date: Mar 29, 2021  Discharge Plan:  [facility]  Fall Risk Level: Yellow  Is the duration of therapy 15 hours over 7 days instead of the standard 3 hours of therapy, 5 out of 7 days a week?: No  Physical Therapy Recommendation: 5-6 days/week, 60-120 mins/day, 1:1 treatment  Occupational Therapy Recommendation: 5-6 days/week, 60-120 mins/day        The following is a list of patient problems that have been identified by the interdisciplinary team:   Encounter Problems       Encounter Problems (Active)       Bladder Management       LTG: Patient will be able to void spontaneously with PVR of less than 100 cc.  (Adequate for Discharge)       Expected End: 03/30/21       Patient is continent of bladder currently with insufficient amount of urine. Will monitor PVR's and in and out as ordered.            Compromised Tissue integrity       Damaged tissue is healing and protected (Progressing)       Start: 03/17/21        Interventions:  1. Monitor/assess Braden scale every shift  2. Provide wound care per wound care algorithm  3. Reposition patient every 2 hours and as needed unless able to reposition self  4. Increase activity as tolerated/progressive mobility  5. Relieve pressure to bony prominences for patients at moderate and high risk  6. Avoid shearing injuries   7. Keep intact skin clean and dry  8. Use bath wipes, not soap and water, for daily bathing   9. Use incontinence wipes for cleaning urine, stool and caustic drainage; Foley care as needed   10. Monitor external devices/tubes for correct placement to prevent pressure, friction and shearing   11. Encourage use of lotion/moisturizer on skin  12. Monitor patient's hygiene practices  13. Consult/collaborate with wound care nurse   14. Utilize specialty bed  15. Consider placing an indwelling catheter if incontinence interferes with healing of stage 3 or 4 pressure injury         Nutritional status is improving (Progressing)       Start: 03/17/21       Interventions:  1. Assist patient with eating  2. Allow adequate time for meals   3. Encourage patient to take dietary supplement(s) as ordered   4. Collaborate with Clinical Nutritionist  5. Include patient/patient care companion in decisions related to nutrition            Endocrine       LTG: Prior D/C patient will demonstrate the ability to self manage diabetes at home and what to do when symptoms arise. (Progressing)       Expected End: 04/05/21               Mobility       LTG: Pt will be mod I for navigating community distances and for curb step in order to safely enter home.  (Progressing)             Pain Management       LTG: Pain will verbalized adequate pain control with scheduled/ current pain medication prior to D/C. (Progressing)       Expected End: 03/30/21               Safety Risk       LTG: Patient will call for assistance for out of bed activities and remain free from falls through hospitalization.  (Progressing)       Expected End: 04/30/21       No safety issues currently. Patient calls appropriately for out of bed assistance.            Self Care Management       LTG: Pt will complete all ADLs and household transfers with mod I and AD/DME/AE PRN (Progressing)       Start: 03/17/21                   Self Care Functional Status:   Admission Assessment Current Status Discharge  Goal   Functional Area: Care Score:  Care Score:    Eating 6 6 Independent   Oral Hygiene 4 4 Independent   Toileting Hygiene 4 4 Independent   Shower/Bathe Self 4 4 Independent   Upper Body Dressing 4 4 Independent   Lower Body Dressing 3 3 Independent   Putting On/Taking Off Footwear 3 2 Independent       Mobility Functional Status:   Admission Assessment Current   Status  Discharge   Goal   Functional Area: Care Score:  Care Score:    Roll Left and Right 88 4 Independent   Sit to Lying 3 4 Independent   Lying to Sitting on Side of Bed 88 4 Independent   Sit to Stand 3 4 Independent   Chair/Bed-to-Chair Transfer 4 4 Independent   Toilet Transfer 4 4 Independent   Car Transfer 88 88 Independent   Walk 10 Feet 4 4 Independent   Walk 50 Feet with Two Turns 4 4 Independent   Walk 150 Feet 88 88 Independent   Walk 50 Feet with Two Turns 4 4 Independent   1 Step (Curb) 88 88 Independent   4 Steps 88 88 Independent   12 Steps 88 88 Supervision or touching assistance   Wheel 50 Feet with Two Turns         Wheel 150 Feet         Picking Up Object 88 88 Independent

## 2021-03-21 NOTE — PT Progress Note (Signed)
Physical Therapy  Inpatient Rehabilitation Daily Progress Note    Patient Name:  [name] [name]       Medical Record Number: [id number]   Date of Birth: 1951-04-12  Sex: Female          Room/Bed:  M502/M502-01    Rehabilitation Precautions/Restrictions:  Weight Bearing Status: no restrictions  Back Brace Applied: OOB (TLSO)  Spinal Precautions: no bending, no twisting, no lifting  Other Precautions: Falls    Rehab Diagnosis: Lumbar compression fracture, closed, initial encounter [S32.000A]     Subjective   Patient Report: I didn't take any pain meds before therapy this morning and had to ask for some after because I was hurting.  Patient/Caregiver Goals: Be in less pain and be able to move.  Pain Assessment:  Pain Assessment: Numeric Scale (0-10)  Pain Score: 5-moderate pain  Pain Location: Back  Pain Orientation: Lower  Pain Descriptors: Aching  Pain Intervention(s): Medication (See eMAR), Repositioned, Rest (RN gave pain meds ~1 hr prior to session)    Objective   Vitals:  Heart Rate: 98  Pulse (SpO2): 93  BP: 134/72  BP Location: Left arm  MAP (mmHg): 93  Patient Position: Sitting    Interventions:   Handoff completed with RN, pt cleared for participation in therapy. Pt in bed upon arrival, agreeable to treatment.  Pt performed bed mobility with Supv, demonstrating good awareness of log rolling techn.  Pt was able to don TLSO brace sitting at the EOB with set up and Min A.   Pt transfers from bed <-> W/C with Supv and RW vs. bed rail.  Instructed pt on seated LE ther ex 2 x10 reps each with 5 sec holds including: heel/toe raises, LAQ's and hip flexion.   Provided gait training 162ft with RW and CGA for steadying/safety.   Pt returned to bed with all needs met. Encouraged pt to get out of bed for dinner, which she agreed to.       Education Documentation  Rehab techniques/procedure, taught by Geronimo Running, PT at 03/21/2021  5:29 PM.  Learner: Patient  Readiness: Acceptance  Method: Explanation, Teach Back,  Demonstration  Response: Verbalizes Understanding, Demonstrated Understanding, Needs Reinforcement    Precautions, taught by Geronimo Running, PT at 03/21/2021  5:29 PM.  Learner: Patient  Readiness: Acceptance  Method: Explanation, Teach Back, Demonstration  Response: Verbalizes Understanding, Demonstrated Understanding, Needs Reinforcement    Pain management, taught by Geronimo Running, PT at 03/21/2021  5:29 PM.  Learner: Patient  Readiness: Acceptance  Method: Explanation, Teach Back, Demonstration  Response: Verbalizes Understanding, Demonstrated Understanding, Needs Reinforcement    Home exercise program, taught by Geronimo Running, PT at 03/21/2021  5:29 PM.  Learner: Patient  Readiness: Acceptance  Method: Explanation, Teach Back, Demonstration  Response: Verbalizes Understanding, Demonstrated Understanding, Needs Reinforcement    Functional transfers/mobility, taught by Geronimo Running, PT at 03/21/2021  5:29 PM.  Learner: Patient  Readiness: Acceptance  Method: Explanation, Teach Back, Demonstration  Response: Verbalizes Understanding, Demonstrated Understanding, Needs Reinforcement    Fall prevention/balance training, taught by Geronimo Running, PT at 03/21/2021  5:29 PM.  Learner: Patient  Readiness: Acceptance  Method: Explanation, Teach Back, Demonstration  Response: Verbalizes Understanding, Demonstrated Understanding, Needs Reinforcement    Education Comments  No comments found.         Assessment & Plan   Pt reports pain better controlled this session. Pt was able to increase distance amb this session, but requires cuing to relax shoulders when using RW. Pt  can benefit from continued PT services to progress mobility, maximize functional independence and decrease caregiver burden upon discharge.    Plan:  Risks/Benefits/POC Discussed with Pt/Family: With patient  Treatment/Interventions: Exercise, Gait training, Stair training, Neuromuscular re-education, Functional transfer training, LE  strengthening/ROM, Endurance training, Patient/family training, Equipment eval/education    Recommendation:  Discharge Recommendation: Home with home health PT  PT Therapy Recommendation: 5-6 days/week, 60-120 mins/day, 1:1 treatment  PT Estimated Length of Stay: 10 days from IE on 03/17/21

## 2021-03-21 NOTE — Progress Notes (Signed)
IRF PPS Data Collection Rights and Rehab Privacy Act Notice: Patient and family/caregiver received a copy of the IRF-PAI Data Collection Rights and Rehab Privacy Act Notice.

## 2021-03-21 NOTE — OT Progress Note (Signed)
Occupational Therapy  Inpatient Rehabilitation Daily Progress Note    Patient Name:  [name] [name]       Medical Record Number: [id number]   Date of Birth: August 06, 1951  Sex: Female          Room/Bed:  M502/M502-01    Rehabilitation Precautions/Restrictions:  Weight Bearing Status: no restrictions  Back Brace Applied: OOB  Spinal Precautions: no bending, no twisting, no lifting    Rehab Diagnosis: Lumbar compression fracture, closed, initial encounter [S32.000A]     Subjective   Patient Report: "The pain goes up when I move"  Patient/Caregiver Goals: Be in less pain and be able to move.  Pain Assessment:  Pain Assessment: Numeric Scale (0-10)  Pain Score: 4-moderate pain  Pain Location: Back  Pain Orientation: Lower  Pain Frequency: Increases with movement, Constant/continuous  Effect of Pain on Daily Activities: mild  Pain Intervention(s): Rest, Relaxation technique, Repositioned    Objective   Vitals:  Heart Rate: (!) 102  BP: 103/69  MAP (mmHg): 81    Interventions: Pt received sitting up in wc + lumbar back brace upon OT arrival. RN cleared for therapy prior noting low O2 (baseline COPD). RN reporting no pain medication/OxyContin provided at this time d/t low O2. OT monitoring throughout. Pt reporting baseline 4/10 pain in low back, progressing to 6/10 with transitional movement, and stabilizing at 5/10 with standing/ambulation. O2 consistently 90% with sitting and standing/ambulation. Increased to 92% with further ambulation during session.     Pt requesting change in back brace to one with increased thoracic support. SPV to doff lumbar brace and min A to don new one. Pt completing brief ambulation with RW and CGA to monitor O2. Again, no desaturations from 90% noted.     Pt taken to 5B gym for practice with reacher for floor item retrieval. CGA with RW and reacher use to pick up 3 items from floor and placing in walker basket. Education on all AE provided with pt reporting acute care therapist providing reacher.      Pt then educated on possible DME for shower transfers upon [place]. Pt reporting unsure of where she will [place]. She is staying with family now, but as they are moving, she may be staying at and Extended stay with them. OT educating on shower chair and TTB to increase understanding of all options. Pt educated on having family provide insight into extended stay when they move in next week and to inform primary team. Pt agreeable.     Pt reporting desire for pain meds. BP noted to be low and OT/RN providing education on how OxyContin affects BP, as well as recommendation to take only prior to scheduled rest break. Pt agreeable.\    Assessment:    Education Documentation  Rehab techniques/procedure, taught by Idamae Lusher at 03/21/2021 10:00 AM.  Learner: Patient  Readiness: Acceptance  Method: Explanation  Response: Verbalizes Understanding  Comment: incentive spirometry, reacher + RW use for floor item retrieval, S/S of low bp, SOB s/s    Pain management, taught by Idamae Lusher at 03/21/2021 10:00 AM.  Learner: Patient  Readiness: Acceptance  Method: Explanation  Response: Verbalizes Understanding  Comment: incentive spirometry, reacher + RW use for floor item retrieval, S/S of low bp, SOB s/s    Equipment, taught by Idamae Lusher at 03/21/2021 10:00 AM.  Learner: Patient  Readiness: Acceptance  Method: Explanation  Response: Verbalizes Understanding  Comment: incentive spirometry, reacher + RW use for floor item retrieval, S/S of low bp,  SOB s/s    ADL retraining, taught by Idamae Lusher at 03/21/2021 10:00 AM.  Learner: Patient  Readiness: Acceptance  Method: Explanation  Response: Verbalizes Understanding  Comment: incentive spirometry, reacher + RW use for floor item retrieval, S/S of low bp, SOB s/s    Education Comments  No comments found.        Assessment & Plan   Pt making slow progress in IP OT and motivated. She is most limited by her pain tolerance and consistent pain in her LB. At this time [facility] disposition is  TBD. Primary OT informed. Cont pain mgmt education and technique incorporation. Cont POC.    Plan:  Risks/Benefits/POC Discussed with Pt/Family: With patient  Treatment Interventions: ADL retraining, Functional transfer training, UE strengthening/ROM, Endurance training, Patient/Family training, Equipment eval/education, Compensatory technique education    Recommendation:  Discharge Recommendation: Home with supervision, Home with home health OT  OT Therapy Recommendation: 5-6 days/week, 60-120 mins/day  OT Estimated Length of Stay: 10-14 days from IE (03/17/21)

## 2021-03-21 NOTE — Plan of Care (Signed)
Problem: Pain Management  Goal: LTG: Pain will verbalized adequate pain control with scheduled/ current pain medication prior to D/C.  Outcome: Progressing  Note: Patient rates pain as a 4/10. Oxycodone 5 mg helps patient manage pain.      Problem: Safety Risk  Goal: LTG: Patient will call for assistance for out of bed activities and remain free from falls through hospitalization.  03/21/2021 1506 by Rubye Beach, RN  Outcome: Progressing  Note: Patient uses call light 100% of the time when assistance is needed.   03/21/2021 1458 by Rubye Beach, RN  Outcome: Progressing  Note: Patient will monitor blood pressure while out of bed. Patient's blood pressure tends to drop a little when upright. Provided patient education with the relationship between pain medication and blood pressure to help prevent falls.

## 2021-03-22 LAB — COMPREHENSIVE METABOLIC PANEL
ALT: 48 U/L (ref 0–55)
AST (SGOT): 18 U/L (ref 5–34)
Albumin/Globulin Ratio: 1.3 (ref 0.9–2.2)
Albumin: 3.2 g/dL — ABNORMAL LOW (ref 3.5–5.0)
Alkaline Phosphatase: 175 U/L — ABNORMAL HIGH (ref 37–117)
Anion Gap: 10 (ref 5.0–15.0)
BUN: 8 mg/dL (ref 7.0–19.0)
Bilirubin, Total: 0.4 mg/dL (ref 0.2–1.2)
CO2: 27 mEq/L (ref 22–29)
Calcium: 8.7 mg/dL (ref 8.5–10.5)
Chloride: 95 mEq/L — ABNORMAL LOW (ref 100–111)
Creatinine: 0.5 mg/dL — ABNORMAL LOW (ref 0.6–1.0)
Globulin: 2.4 g/dL (ref 2.0–3.6)
Glucose: 104 mg/dL — ABNORMAL HIGH (ref 70–100)
Potassium: 4.2 mEq/L (ref 3.5–5.1)
Protein, Total: 5.6 g/dL — ABNORMAL LOW (ref 6.0–8.3)
Sodium: 132 mEq/L — ABNORMAL LOW (ref 136–145)

## 2021-03-22 LAB — CBC
Absolute NRBC: 0 10*3/uL (ref 0.00–0.00)
Hematocrit: 36.8 % (ref 34.7–43.7)
Hgb: 12.5 g/dL (ref 11.4–14.8)
MCH: 31.8 pg (ref 25.1–33.5)
MCHC: 34 g/dL (ref 31.5–35.8)
MCV: 93.6 fL (ref 78.0–96.0)
MPV: 11.1 fL (ref 8.9–12.5)
Nucleated RBC: 0 /100 WBC (ref 0.0–0.0)
Platelets: 266 10*3/uL (ref 142–346)
RBC: 3.93 10*6/uL (ref 3.90–5.10)
RDW: 13 % (ref 11–15)
WBC: 4.76 10*3/uL (ref 3.10–9.50)

## 2021-03-22 LAB — GLUCOSE WHOLE BLOOD - POCT
Whole Blood Glucose POCT: 120 mg/dL — ABNORMAL HIGH (ref 70–100)
Whole Blood Glucose POCT: 206 mg/dL — ABNORMAL HIGH (ref 70–100)
Whole Blood Glucose POCT: 113 mg/dL — ABNORMAL HIGH (ref 70–100)
Whole Blood Glucose POCT: 134 mg/dL — ABNORMAL HIGH (ref 70–100)

## 2021-03-22 LAB — GFR: EGFR: >60

## 2021-03-22 MED ORDER — FLUTICASONE PROPIONATE 50 MCG/ACT NA SUSP
1.0000 | Freq: Two times a day (BID) | NASAL | Status: DC
Start: 2021-03-22 — End: 2021-03-29
  Administered 2021-03-22 – 2021-03-29 (×14): 1 via NASAL
  Filled 2021-03-22 (×2): qty 16

## 2021-03-22 MED ORDER — CETIRIZINE HCL 10 MG PO TABS
10.0000 mg | ORAL_TABLET | Freq: Every day | ORAL | Status: DC
Start: 2021-03-22 — End: 2021-03-29
  Administered 2021-03-22 – 2021-03-29 (×8): 10 mg via ORAL
  Filled 2021-03-22 (×8): qty 1

## 2021-03-22 NOTE — Plan of Care (Signed)
Problem: Bladder Management  Goal: LTG: Patient will be able to void spontaneously with PVR of less than 100 cc.   Outcome: Progressing  Patient is continent of bladder and PVR is usually below     Problem: Endocrine  Goal: LTG: Prior D/C patient will demonstrate the ability to self manage diabetes at home and what to do when symptoms arise.  Outcome: Progressing     Problem: Pain Management  Goal: LTG: Pain will verbalized adequate pain control with scheduled/ current pain medication prior to D/C.  Outcome: Progressing  Patient pain is managed by scheduled pain medication     Problem: Safety Risk  Goal: LTG: Patient will call for assistance for out of bed activities and remain free from falls through hospitalization.  Outcome: Progressing   Patient calls appropriately for assistance

## 2021-03-22 NOTE — PT Progress Note (Signed)
Physical Therapy  Inpatient Rehabilitation Weekly Progress Note    Patient Name:  [name] [name]       Medical Record Number: [id number]   Date of Birth: 04-Jan-1951  Sex: Female          Room/Bed:  M502/M502-01    Rehabilitation Precautions/Restrictions:  Weight Bearing Status: no restrictions  Back Brace Applied: OOB (TLSO)  Spinal Precautions: no bending, no twisting, no lifting  Other Precautions: Falls    Rehab Diagnosis: Lumbar compression fracture, closed, initial encounter [S32.000A]     Subjective   Patient Report: Pt agreeable to therapy.  Patient/Caregiver Goals: Be in less pain and be able to move.  Pain Assessment:  Pain Assessment: Numeric Scale (0-10)  Pain Score: 5-moderate pain  Pain Location: Back  Pain Orientation: Lower  Pain Descriptors: Aching    Objective   Vitals:  Heart Rate: 91  BP: 113/71  BP Location: Right arm  MAP (mmHg): 85  Patient Position: Sitting    Interventions: Pt up in WC when PT arrived. PT performed Care[employer] assessment.     Mobility Functional Status:   Current   Status Current Status Discharge Goal   Functional Area: Care Score:  Comments:    Roll Left and Right 6    Independent   Sit to Lying 6    Independent   Lying to Sitting on Side of Bed 6    Independent   Sit to Stand 4 rw   Independent   Chair/Bed-to-Chair Transfer 4 RW   Independent   Car Transfer 4 VCs and TCs   Independent   Walk 10 Feet 4 RW   Independent   Walk 50 Feet with Two Turns 4 RW   Independent   Walk 10 Feet on Uneven Surface 4 RW; CGA   Independent   Walk 150 Feet 4 RW   Independent   1 Step (Curb) 4 RW, CGA   Independent   4 Steps 4 B rails   Independent   12 Steps 4 B rails   Supervision or touching assistance   Wheel 50 Feet with Two Turns          Wheel 150 Feet              Evaluation:       Assessments:      Encounter Problems       Encounter Problems (Active)       PT - General and Outcome Measures       LTG: Patient will adhere to ordered precautions 100% of time throughout mobility without  verbal cues to ensure safety with all mobility tasks. (Progressing)       Start: 03/17/21            LTG: Patient will complete all transfers with recommended DME, modified independent assistance in order to maximize independence and prevent falls. (Progressing)       Start: 03/17/21            LTG: Patient will walk 150 ft with recommended DME, modified independent assistance in order to access areas of their home and community/decrease burden of care. (Progressing)       Start: 03/17/21            LTG: Patient will ascend/descend two curb steps with recommended DME, modified independent assistance in order to safely and independently access their home/community. (Progressing)       Start: 03/17/21  Education Documentation  Precautions, taught by Aileen Fass, PT at 03/22/2021  5:30 PM.  Learner: Patient  Readiness: Acceptance  Method: Explanation  Response: Verbalizes Understanding    Plan of care, taught by Aileen Fass, PT at 03/22/2021  5:30 PM.  Learner: Patient  Readiness: Acceptance  Method: Explanation  Response: Verbalizes Understanding    Pain management, taught by Aileen Fass, PT at 03/22/2021  5:30 PM.  Learner: Patient  Readiness: Acceptance  Method: Explanation  Response: Verbalizes Understanding    Education Comments  No comments found.        Assessment & Plan   Dr [name] presents with improving mobility. Barriers to discharge remain activity tolerance, pain, decreased independence. Continue POC    Plan:  Risks/Benefits/POC Discussed with Pt/Family: With patient  Treatment/Interventions: Exercise, Gait training, Stair training, Neuromuscular re-education, Functional transfer training, LE strengthening/ROM, Endurance training, Patient/family training, Equipment eval/education    Recommendation:  Discharge Recommendation: Home with home health PT  PT Therapy Recommendation: 5-6 days/week, 60-120 mins/day, 1:1 treatment  PT Estimated Length of Stay: 10 days from  IE on 03/17/21

## 2021-03-22 NOTE — Plan of Care (Signed)
Problem: Bladder Management  Goal: LTG: Patient will be able to void spontaneously with PVR of less than 100 cc.   Outcome: Progressing     Problem: Pain Management  Goal: LTG: Pain will verbalized adequate pain control with scheduled/ current pain medication prior to D/C.  Outcome: Progressing  Note: Pain on back 8/10 in the morning. Oxycodone 10 mg was given.     Problem: Safety Risk  Goal: LTG: Patient will call for assistance for out of bed activities and remain free from falls through hospitalization.  Outcome: Progressing

## 2021-03-22 NOTE — Progress Notes (Signed)
Therapeutic Recreation  Inpatient Rehabilitation Progress Note    Patient Name:  [name] [name]       Medical Record Number: [id number]   Date of Birth: April 07, 1951  Sex: Female          Room/Bed:  M502/M502-01    Rehabilitation Precautions/Restrictions:  Weight Bearing Status: no restrictions  Back Brace Applied: OOB (TLSO)  Spinal Precautions: no bending, no twisting, no lifting  Other Precautions: Falls    Rehab Diagnosis: Lumbar compression fracture, closed, initial encounter [S32.000A]     Subjective   Patient Report: This was nice. Thank you  Patient/Caregiver Goals: Be in less pain and be able to move.  Therapeutic Recreation  Intellectual Interests: Puzzles  Home Activities: Television, Plant Care Merchant navy officer)  Social Interests: Hydrographic surveyor Interests: Other, Tax adviser (Coloring)  Activities: Arts/Crafts, Games, Gardening    Objective   Pt was greeted in her room, and and agreed to participate in TR session. During session, Pt chose to participate in coloring activity for psychosocial benefits of creative expression and relaxation.    Education Documentation  No documentation found.  Education Comments  No comments found.        Assessment & Plan   Pt will continue to benefit from TR services 2-3x/week to address activity tolerance and psychosocial benefits of leisure engagement    Groups appropriate for patient:   Arts and Crafts, Horticultural    Group Justification: Psychosocial benefits of socializing with other patients.

## 2021-03-22 NOTE — Progress Notes (Signed)
PHYSICAL MEDICINE AND REHABILITATION  PROGRESS NOTE -- FACE-TO-FACE ENCOUNTER    Date Time: 03/22/21 7:55 PM  Patient Name: [name] [name],[name] [name]    Admission date:  03/16/2021    Subjective:     Patient was seen earlier today.  Complaining of allergies with nonproductive cough.  Also has rhinorrhea and postnasal drip.  I spoke with nursing.    Functional Status:     Session focused on improving gross strength, standing balance for increased independence during selfcare and functional mobility towards ADLs.  Pt. Completing mass practice BUE exercises using 2# x10x3 including: bicep curls, chest press, shoulder press. Body weight exercise 4 sets 30 seconds with 15 seconds rest to improve strength, muscular endurance, inculding: shoulder circles and forward punches. BLE seated exercises x3x10 including: marches, calf raises. Pt. Participated in discussion of simple activity adaptations for pt. Identified activities including pt. Performing activities in seated. And ed/practice with progressive muscle relaxation for releasing tension specifically in b shoulders.  Pt. Completed toileting, all aspects CGA using grab bars and RW. Pt. Completing multiple reps of short household distances using RW - close SPV. Pt. Completed bed mobility - SPV using log roll technique.      Medications:   Medication reviewed by me:     Scheduled Meds: PRN Meds:   calcium citrate-vitamin D, 1 tablet, Oral, Daily with lunch  cetirizine, 10 mg, Oral, Daily  famotidine, 20 mg, Oral, Daily with dinner  fluticasone, 1 spray, Each Nare, BID  heparin (porcine), 5,000 Units, Subcutaneous, Q8H SCH  insulin glargine, 10 Units, Subcutaneous, QHS  insulin lispro, 1-3 Units, Subcutaneous, QHS  insulin lispro, 1-5 Units, Subcutaneous, TID AC  letrozole, 2.5 mg, Oral, Daily  lisinopril, 2.5 mg, Oral, Q12H SCH  metFORMIN, 500 mg, Oral, BID Meals  nicotine, 1 patch, Transdermal, Daily  senna-docusate, 2 tablet, Oral, QHS  sodium chloride, 1 g, Oral,  BID  tiotropium, 2 puff, Inhalation, QAM  vitamin D (cholecalciferol), 25 mcg, Oral, Daily      Continuous Infusions:   acetaminophen, 1,000 mg, Q6H PRN  alum & mag hydroxide-simethicone, 30 mL, Q4H PRN  bisacodyl, 10 mg, Daily PRN  bisacodyl, 10 mg, Daily PRN  glucagon (rDNA), 1 mg, PRN   And  dextrose, 250 mL, PRN   And  dextrose, 25 g, PRN   And  dextrose, 25 g, PRN  melatonin, 3 mg, QHS PRN  ondansetron, 4 mg, Q8H PRN   Or  ondansetron, 4 mg, Q8H PRN  oxyCODONE, 10 mg, Q4H PRN  oxyCODONE, 5 mg, Q4H PRN  polyethylene glycol, 17 g, Daily PRN  sodium phosphate, 1 enema, Daily PRN        Medication Review  A complete drug regimen review was completed: Yes  Were any drug issues found during review?: No          Review of Systems:   A comprehensive review of systems was: No fevers, chills, nausea, vomiting, chest pain, shortness of breath, cough, headache, double vision.  All others negative.    Physical Exam:     Vitals:    03/22/21 0547 03/22/21 0903 03/22/21 1435 03/22/21 1641   BP: 117/68 106/62 113/71 146/73   Pulse: 78  91 94   Resp: 18      Temp: 98.2 F (36.8 C)   97.3 F (36.3 C)   TempSrc:    Oral   SpO2: 94%   97%   Weight:       Height:  Intake and Output Summary (Last 24 hours) at Date Time    Intake/Output Summary (Last 24 hours) at 03/22/2021 1956  Last data filed at 03/22/2021 1239  Gross per 24 hour   Intake 480 ml   Output --   Net 480 ml       P.O.: 240 mL (03/22/21 1239)     Urine: 0 mL (03/21/21 0900)  Bladder Scan Volume (mL): 108 mL (03/19/21 0556)        No acute distress.    Chest rhythmic movement of chest with normal respirations.  Abdomen nondistended    Labs:   No results for input(s): GLUCOSEWHOLE in the last 24 hours.    Recent Labs   Lab 03/22/21  0627 03/19/21  0608 03/17/21  0515   WBC 4.76 6.58 6.72   Hgb 12.5 13.2 13.1   Hematocrit 36.8 39.5 39.2   Platelets 266 299 209          Recent Labs   Lab 03/22/21  0627 03/20/21  0607 03/19/21  0608 03/17/21  0515   Sodium 132* 129*  129* 132*   Potassium 4.2 4.6 4.2 4.4   Chloride 95* 94* 91* 96*   CO2 27 26 28 27    BUN 8.0 8.0 8.0 9.0   Creatinine 0.5* 0.5* 0.6 0.5*   Calcium 8.7 8.8 8.7 8.8   Albumin 3.2* 3.2* 3.2* 3.1*   Protein, Total 5.6* 5.4* 5.5* 5.2*   Bilirubin, Total 0.4 0.4 0.5 0.3   Alkaline Phosphatase 175* 237* 234* 91   ALT 48 106* 118* 25   AST (SGOT) 18 65* 100* 23   Glucose 104* 101* 135* 159*               Results       Procedure Component Value Units Date/Time    Glucose Whole Blood - POCT [[id number]]  (Abnormal) Collected: 03/22/21 1641     Updated: 03/22/21 1659     Whole Blood Glucose POCT 113 mg/dL     Glucose Whole Blood - POCT [[id number]]  (Abnormal) Collected: 03/22/21 1106     Updated: 03/22/21 1108     Whole Blood Glucose POCT 206 mg/dL     Comprehensive metabolic panel [[id number]]  (Abnormal) Collected: 03/22/21 0627    Specimen: Blood Updated: 03/22/21 0814     Glucose 104 mg/dL      BUN 8.0 mg/dL      Creatinine 0.5 mg/dL      Sodium 034 mEq/L      Potassium 4.2 mEq/L      Chloride 95 mEq/L      CO2 27 mEq/L      Calcium 8.7 mg/dL      Protein, Total 5.6 g/dL      Albumin 3.2 g/dL      AST (SGOT) 18 U/L      ALT 48 U/L      Alkaline Phosphatase 175 U/L      Bilirubin, Total 0.4 mg/dL      Globulin 2.4 g/dL      Albumin/Globulin Ratio 1.3     Anion Gap 10.0    GFR [[id number]] Collected: 03/22/21 0627     Updated: 03/22/21 0814     EGFR >60.0       CBC without differential [[id number]] Collected: 03/22/21 0627    Specimen: Blood Updated: 03/22/21 0803     WBC 4.76 x10 3/uL      Hgb 12.5 g/dL      Hematocrit 18.8 %  Platelets 266 x10 3/uL      RBC 3.93 x10 6/uL      MCV 93.6 fL      MCH 31.8 pg      MCHC 34.0 g/dL      RDW 13 %      MPV 11.1 fL      Nucleated RBC 0.0 /100 WBC      Absolute NRBC 0.00 x10 3/uL     Glucose Whole Blood - POCT [[id number]]  (Abnormal) Collected: 03/22/21 0538     Updated: 03/22/21 0543     Whole Blood Glucose POCT 120 mg/dL     Urine Sodium Random [[id number]] Collected: 03/21/21 1710     Specimen: Urine Updated: 03/21/21 2324     Urine Sodium Random 62 mEq/L     Glucose Whole Blood - POCT [[id number]]  (Abnormal) Collected: 03/21/21 2119     Updated: 03/21/21 2121     Whole Blood Glucose POCT 166 mg/dL                  Rads:   Radiological Procedure reviewed.  Radiology Results (24 Hour)       ** No results found for the last 24 hours. **                Assessment and Plan:     Ambulation and activities of daily living dysfunction    L1 and T11 compression fractures  -- Followup progress towards goals with PT and OT.    -- Estimated length of stay to go home if medically stable is June 30    Transaminitis: Resolved  -- 6/20 patient is taking Tylenol 1000 mg perhaps once a day as needed for pain.  We will discontinue Crestor 40 mg p.o. nightly.  Follow daily CMP's for now.  -- 6/21 improving trend  --6/23 Resolved.  Consider restarting a lower dose of Crestor.    Hypertension  -- Continue regimen.  Continue to monitor.  Some lability noted.  -- 6/21 low blood pressures noted.  Currently on lisinopril 10 mg daily.  Will change lisinopril to 2.5 mg twice daily.  -- 6/22 continue current lisinopril 2.5 mg twice daily.  Blood pressures are improved    Hypercholesterolemia  -- 6/20 hold Crestor 40 mg nightly because of elevated LFTs.  Continue to follow LFTs.    Diabetes mellitus type II with hyperglycemia  --6/20: Continue to monitor.  Fairly stable.    Hyponatremia  --6/20 possibly due to oral intake.  Continue to monitor.  Encourage oral intake.  Follow daily CMP for now.  --6/21 sodium level stable 129.  Patient reports that as an outpatient she was on fluid restriction which she observing now.  --6/22 consult nephrology.  -- 6/23 appreciate nephrology efforts.  Improved    COPD  -- No complaints of shortness of breath.  Follow trend    Allergic rhinitis with rhinorrhea  -- 6/23 trial of Zyrtec and Flonase.    History of lung cancer    History of breast cancer    History of nicotine dependence      DVT prophylaxis: Ambulation and heparin 5000 units subcu every 8 hours  --6/19: Negative bilateral lower limb venous duplex for DVT    Patient Active Problem List   Diagnosis    Lumbar compression fracture, closed, initial encounter       Continue comprehensive and intensive inpatient rehab program, including:   Physical therapy 60-120 min daily, 5-6 times per week, Occupational  therapy 60-120 min daily, 5-6 times per week, Case management and Rehabilitation nursing    Signed by: Adella Nissen, MD    [place]    If there are questions or concerns about the content of this note or information contained within the body of this dictation they should be addressed directly with the author for clarification.

## 2021-03-22 NOTE — Progress Notes (Signed)
PROGRESS NOTE    Hospital Day: 6    Assessment:     Hyponatremia.  Chronic.  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  Serum sodium improving with water restriction and salt intake/salt pills.  Essential hypertension--blood pressure under good control  Diabetes mellitus  Hypoalbuminemia    Plan:     Continue salt pills  Continue calcium citrate vitamin D  Sugar control with insulin  Continue Zestril  Had a detailed discussion and asked her to eat more food especially protein diet  Water restriction to 1.5 L in 24 hours  Follow the patient as needed    Subjective:     Patient seen today  Calm and comfortable  Appetite improving  Feeling better  Denies any nausea, vomiting diarrhea or chest pain or shortness of breath    Review of Systems:   General:  no fever, no chills, no rigor, awake and alert, feeling better   HEENT: no neck pain, no throat pain  Endocrine: no fatigue   Respiratory: no cough, shortness of breath, or wheezing   Cardiovascular: no chest pain   Gastrointestinal: no abdominal pain,no N/V/D      Physical Exam:     Vitals:    03/22/21 0903   BP: 106/62   Pulse:    Resp:    Temp:    SpO2:        Intake and Output Summary (Last 24 hours) at Date Time    Intake/Output Summary (Last 24 hours) at 03/22/2021 1406  Last data filed at 03/22/2021 1239  Gross per 24 hour   Intake 720 ml   Output --   Net 720 ml         General: No acute distress.  Has facial hair.  Low muscle mass.  BMI of 22  HEENT: No pallor.  Neck: No jugular venous distension.  Chest: Clear to auscultation.   CVS: Regular rate and rhythm. Normal S1S2. No murmur or rub.  Abdomen: Nontender, non distended. Positive bowel sounds.  No lower extremity edema.  No rash on limbs.     Scheduled Meds: Continuous Infusions:    calcium citrate-vitamin D, 1 tablet, Oral, Daily with lunch  famotidine, 20 mg, Oral, Daily with dinner  heparin (porcine), 5,000 Units, Subcutaneous, Q8H SCH  insulin glargine, 10 Units, Subcutaneous, QHS  insulin lispro, 1-3 Units, Subcutaneous,  QHS  insulin lispro, 1-5 Units, Subcutaneous, TID AC  letrozole, 2.5 mg, Oral, Daily  lisinopril, 2.5 mg, Oral, Q12H SCH  metFORMIN, 500 mg, Oral, BID Meals  nicotine, 1 patch, Transdermal, Daily  senna-docusate, 2 tablet, Oral, QHS  sodium chloride, 1 g, Oral, BID  tiotropium, 2 puff, Inhalation, QAM  vitamin D (cholecalciferol), 25 mcg, Oral, Daily                  Labs:     Recent Labs   Lab 03/22/21  0627 03/19/21  0608 03/17/21  0515   WBC 4.76 6.58 6.72   Hgb 12.5 13.2 13.1   Hematocrit 36.8 39.5 39.2   Platelets 266 299 209     Recent Labs   Lab 03/22/21  0627 03/20/21  0607 03/19/21  0608   Sodium 132* 129* 129*   Potassium 4.2 4.6 4.2   Chloride 95* 94* 91*   CO2 27 26 28    BUN 8.0 8.0 8.0   Creatinine 0.5* 0.5* 0.6   EGFR >60.0 >60.0 >60.0   Glucose 104* 101* 135*   Calcium 8.7 8.8 8.7   Albumin 3.2*  3.2* 3.2*       Cristal Ford, MD  03/22/2021

## 2021-03-22 NOTE — Group Note (Signed)
Rehab - OT Group  Inpatient Rehabilitation Group Note    Patient Name:  [name] [name]       Medical Record Number: [id number]   Date of Birth: January 10, 1951  Sex: Female          Rehab Diagnosis: Lumbar compression fracture, closed, initial encounter [S32.000A]     Today's Date: 03/22/2021   Group Start Time: 1000   Group End Time: 1100   Group Facilitators: Grayland Jack, OT  Group Topic: Rehab - OT Group  Discipline Led: OT  Number of Patient Participants: 3    Vitals:  BP: 111/66  HR: 93    Treatment Provided  Session focused on improving gross strength, standing balance for increased independence during selfcare and functional mobility towards ADLs.   Pt. Completing mass practice BUE exercises using 2# x10x3 including: bicep curls, chest press, shoulder press. Body weight exercise 4 sets 30 seconds with 15 seconds rest to improve strength, muscular endurance, inculding: shoulder circles and forward punches. BLE seated exercises x3x10 including: marches, calf raises. Pt. Participated in discussion of simple activity adaptations for pt. Identified activities including pt. Performing activities in seated. And ed/practice with progressive muscle relaxation for releasing tension specifically in b shoulders.   Pt. Completed toileting, all aspects CGA using grab bars and RW. Pt. Completing multiple reps of short household distances using RW - close SPV. Pt. Completed bed mobility - SPV using log roll technique.   See Education Activity for details.    Assessment  Benefited from support and encouragement of peers in a social setting  Participant(s) asked appropriate questions  Participant(s) shared personal experiences  Participant(s) had good participation

## 2021-03-22 NOTE — OT Progress Note (Signed)
Occupational Therapy  Inpatient Rehabilitation Weekly Progress Note    Patient Name:  [name] [name]       Medical Record Number: [id number]   Date of Birth: July 01, 1951  Sex: Female          Room/Bed:  M502/M502-01    Rehabilitation Precautions/Restrictions:  Weight Bearing Status: no restrictions  Back Brace Applied: OOB (TLSO)  Spinal Precautions: no bending, no twisting, no lifting  Other Precautions: Falls    Rehab Diagnosis: Lumbar compression fracture, closed, initial encounter [S32.000A]     Subjective   Patient Report: "I should've asked for pain medication before our session"  Patient/Caregiver Goals: Be in less pain and be able to move.  Pain Assessment:  Pain Assessment: Numeric Scale (0-10)  Pain Score: 6-moderate pain  Pain Location: Back  Pain Intervention(s): Medication (See eMAR), Heat applied    Objective     Interventions:   Pt received semi-supine in bed and drinking coffee. Agreeable to OT tx. Pt transferred to EOB via log roll and bed rails with increased time. Reported significant pain, therefore RN notified. Received pain medication near end of session after additional follow up. Pt donned brace with supervision and VCs for technique. Pt retrieved clothing while seated from drawers, then ambulated to bathroom with casual supervision and FWW. Completed morning ADL routine (see CARE tool for details). Cues required to incorporate deep breathing strategies for pain management. Educated on sequence of bathing routine to accommodate brace wearing precautions (I.e. no standing without LSO). Fair carry over observed with cues. No significant LOB or rest breaks required throughout session.     Self Care Functional Status:   Current Status Current Status Discharge Goal   Functional Area: Care Score: Comments:    Eating 6    Independent   Oral Hygiene 6    Independent   Toileting Hygiene 5 Wipes and FWW within reach   Independent   Shower/Bathe Self 4 Shower seat; instruction and cues for sequencing  donning/doffing brace/shirt to adhere to precautions as well as to incorporate pain management strategies   Independent   Upper Body Dressing 4 VCs for technique to don brace; min A for shoulder strap if donning without overhead method   Independent   Lower Body Dressing 3    Independent   Putting On/Taking Off Footwear 5 Tailor sit method   Independent     Mobility Functional Status:   Current   Status Current Status Discharge Goal   Functional Area: Care Score: Comments:    Toilet Transfer 4 FWW for safety and balance   Independent   Picking Up Object 4 FWW and reacher to adhere to precautions   Independent       Encounter Problems       Encounter Problems (Active)       OT General       STG: Pt will adhere to spinal precautions independently without VCs (Completed)       Start: 03/17/21   Expected End: 03/24/21   Met: 03/22/21         STG: Pt will don/doff brace with supervision and 0 VCs for technique (Progressing)       Start: 03/17/21   Expected End: 03/24/21         Goal Note       Continues to require min A for shoulder strap              STG: Pt will don LB clothing with AE PRN and SBA (  Completed)       Start: 03/17/21   Expected End: 03/24/21   Met: 03/22/21         LTG: Pt will complete basic ADL routine with mod I and AE/DME PRN       Start: 03/17/21            LTG: Pt will complete household transfers with mod I and AD/DME PRN       Start: 03/17/21            LTG: Pt will complete simple IADL of choice with mod I in order to promote safe return home with limited caregiver assist       Start: 03/17/21            LTG: Pt will verbalize and independently initiate pain management strategies to promote increased activity tolerance        Start: 03/17/21                   Education Documentation  Muscle weakness, taught by Alfonse Alpers, OT at 03/22/2021  9:44 AM.  Learner: Patient  Readiness: Acceptance  Method: Explanation, Teach Back  Response: [name] Understanding, Needs Reinforcement    Emotional  adjustments/depression, taught by Alfonse Alpers, OT at 03/22/2021  9:44 AM.  Learner: Patient  Readiness: Acceptance  Method: Explanation, Teach Back  Response: Verbalizes Understanding, Needs Reinforcement    Reconditioning, taught by Alfonse Alpers, OT at 03/22/2021  9:44 AM.  Learner: Patient  Readiness: Acceptance  Method: Explanation, Teach Back  Response: Verbalizes Understanding, Needs Reinforcement    Signs/symptoms associated with debility, taught by Alfonse Alpers, OT at 03/22/2021  9:44 AM.  Learner: Patient  Readiness: Acceptance  Method: Explanation, Teach Back  Response: Verbalizes Understanding, Needs Reinforcement    Precautions, taught by Alfonse Alpers, OT at 03/22/2021  9:44 AM.  Learner: Patient  Readiness: Acceptance  Method: Explanation, Teach Back  Response: Verbalizes Understanding, Needs Reinforcement    Pain management, taught by Alfonse Alpers, OT at 03/22/2021  9:44 AM.  Learner: Patient  Readiness: Acceptance  Method: Explanation, Teach Back  Response: Verbalizes Understanding, Needs Reinforcement    Orthotics/prosthetics, taught by Alfonse Alpers, OT at 03/22/2021  9:44 AM.  Learner: Patient  Readiness: Acceptance  Method: Explanation, Teach Back  Response: Verbalizes Understanding, Needs Reinforcement    Functional transfers/mobility, taught by Alfonse Alpers, OT at 03/22/2021  9:44 AM.  Learner: Patient  Readiness: Acceptance  Method: Explanation, Teach Back  Response: Verbalizes Understanding, Needs Reinforcement    Fall prevention/balance training, taught by Alfonse Alpers, OT at 03/22/2021  9:44 AM.  Learner: Patient  Readiness: Acceptance  Method: Explanation, Teach Back  Response: [name] Understanding, Needs Reinforcement    Equipment, taught by Alfonse Alpers, OT at 03/22/2021  9:44 AM.  Learner: Patient  Readiness: Acceptance  Method: Explanation, Teach Back  Response: Verbalizes Understanding, Needs Reinforcement    ADL retraining, taught by Alfonse Alpers, OT at 03/22/2021  9:44 AM.  Learner: Patient  Readiness: Acceptance  Method: Explanation, Teach Back  Response: Verbalizes Understanding, Needs Reinforcement    Education Comments  No comments found.        Assessment & Plan   Pt continues to be primarily limited by pain which impacts timely completion of tasks. Would likely benefit from pre-medicating before therapies in order to allow for greater participation. Will benefit from additional exploration of behavioral pain management strategies, as pain is likely to be present still upon discharge as medication is pt's only source  of pain management at this time.     Plan:  Risks/Benefits/POC Discussed with Pt/Family: With patient  Treatment Interventions: ADL retraining, Functional transfer training, UE strengthening/ROM, Endurance training, Patient/Family training, Equipment eval/education, Compensatory technique education    Recommendation:  Discharge Recommendation: Home with supervision, Home with home health OT  OT Therapy Recommendation: 5-6 days/week, 60-120 mins/day  OT Estimated Length of Stay: 10-14 days from IE (03/17/21)    Groups appropriate for patient:   Mobility    Group Justification: Increase aerobic endurance capacity utilizing peer support  Improve coping and therapeutic participation with support by peers  Receive support and constructive criticism from peers in a group setting  Practice /carry over skills learned in individual sessions with new partners, clinicians, and context

## 2021-03-22 NOTE — Progress Notes (Signed)
Case Management  Inpatient Rehabilitation Initial Discharge Planning Assessment    CM met with patient and daughter at bedside to introduce self and role and discuss plan of care. Provided update per report received in team conference. Advised of estimated discharge date of June 30 and team recommendation for continued therapy services through home health. Patient is visiting from [facility]. Her daughter will soon be transitioning out of the country. Patient has an opportunity to discharge locally to extended stay versus discharge to her home. In [place], patient has a daughter who lives about five minutes away, a son who lives about two hours away.    Patient indicated a preference to discharge locally for a few days but ultimately return to [facility]. She is concerned about medical stability to fly. CM agreed to alert Attending MD so that this concern can be addressed. Advised patient that a home health referral to agencies local to her home is feasible. Will as Post Acute Care Coordinator to follow.     Patient lives alone in [place], with daughter/son in law locally. Patient was independent prior to admission, no DME. Patient has no POA, no military hx, no LTC insurance. Demographics on patient face sheet verified. PCP in [place] listed below.     CM updated patient white board. Will discuss recommendations with patient care team so that plan for discharge can be confirmed.     Recommended follow up per acute care discharge summary:    London Pepper, MD / Neurological Surgery  [facility]  [address]  [address]  [place] [address]  Phone: [phone]    Lance Coon, MD / Endocrinology  [facility]  [address]  [place] [address]  Phone: [phone]    Doristine Locks, MD / Nephrology  [facility]  [address]  [address]  [place] [address]  Phone: [phone]       03/22/21 [phone]   Healthcare Decisions   Interviewed: Patient;Family    Orientation/Decision Making Abilities of Patient Alert and Oriented x3, able to make decisions   Advance Directive Patient does not have advance directive   Healthcare Agent Appointed No   Additional Emergency Contacts? [name], daughter, [phone]   Prior to admission   Prior level of function Independent with ADLs;Ambulates independently   Type of Residence Private residence   Home Layout One level;Stairs to enter without rails (add number in comment)  (1 STE)   DME Currently at Home   (None)   Prior Rehab admission? (Detail) None, hx of home health in [facility]   Discharge Planning   Support Systems Children   Patient expects to be discharged to: local extended stay versus home to [facility]   Anticipated [place] plan discussed with: Same as interviewed   Family and PCP   PCP on file was verified as the current PCP? Provider not on file  Audery Amel, MD - [place] [phone])   In case you are admitted, transferred or discharged, would like family notified? Yes   Name of family member to be notified [name], daughter, [phone]   PCP - first name Casimiro Needle   PCP - middle name Fayrene Fearing   PCP - last name Emilee Hero   PCP - city [place]   PCP - state [place]   PCP - phone  [phone]   PCP - NPI unknown   PCP - FAX unknown       Arlie Solomons, MSW, ACM-SW  Social Worker Case Freight forwarder II  [facility] / Acute Rehabilitation  [phone]

## 2021-03-23 LAB — GLUCOSE WHOLE BLOOD - POCT
Whole Blood Glucose POCT: 121 mg/dL — ABNORMAL HIGH (ref 70–100)
Whole Blood Glucose POCT: 128 mg/dL — ABNORMAL HIGH (ref 70–100)
Whole Blood Glucose POCT: 168 mg/dL — ABNORMAL HIGH (ref 70–100)
Whole Blood Glucose POCT: 124 mg/dL — ABNORMAL HIGH (ref 70–100)

## 2021-03-23 MED ORDER — ALBUTEROL SULFATE (2.5 MG/3ML) 0.083% IN NEBU
2.5000 mg | INHALATION_SOLUTION | Freq: Four times a day (QID) | RESPIRATORY_TRACT | Status: DC | PRN
Start: 2021-03-23 — End: 2021-03-29
  Administered 2021-03-23 – 2021-03-25 (×2): 2.5 mg via RESPIRATORY_TRACT
  Filled 2021-03-23: qty 3

## 2021-03-23 NOTE — Plan of Care (Signed)
Problem: Bladder Management  Goal: LTG: Patient will be able to void spontaneously with PVR of less than 100 cc.   Outcome: Progressing     Problem: Endocrine  Goal: LTG: Prior D/C patient will demonstrate the ability to self manage diabetes at home and what to do when symptoms arise.  Outcome: Progressing     Problem: Pain Management  Goal: LTG: Pain will verbalized adequate pain control with scheduled/ current pain medication prior to D/C.  Outcome: Progressing  Note: Pain on back 7/10.     Problem: Safety Risk  Goal: LTG: Patient will call for assistance for out of bed activities and remain free from falls through hospitalization.  Outcome: Progressing

## 2021-03-23 NOTE — Progress Notes (Signed)
PHYSICAL MEDICINE AND REHABILITATION  PROGRESS NOTE -- FACE-TO-FACE ENCOUNTER    Date Time: 03/23/21 4:26 PM  Patient Name: [name] [name],[name] [name]    Admission date:  03/16/2021    Subjective:     I spoke with nursing.  Patient reports having had some wheezing earlier this morning.  She is agreeable to try some nebulizer as needed.  She thinks she is doing better with therapies.  Pain is under control.    Functional Status:     Vitals:  Heart Rate: 94  BP: 103/68  BP Location: Left arm  MAP (mmHg): 80  Patient Position: Sitting     Interventions: Patient perform 10 minutes on new step, gear zero, very slow pace, gear 0, 0.37mi, 8cal, 730 steps. Patient then ambulated 20 minutes around the first floor gym, up to and around the 5th floor Including some retro ambulation, roughly 800 to 1000'.      Medications:   Medication reviewed by me:     Scheduled Meds: PRN Meds:   calcium citrate-vitamin D, 1 tablet, Oral, Daily with lunch  cetirizine, 10 mg, Oral, Daily  famotidine, 20 mg, Oral, Daily with dinner  fluticasone, 1 spray, Each Nare, BID  heparin (porcine), 5,000 Units, Subcutaneous, Q8H SCH  insulin glargine, 10 Units, Subcutaneous, QHS  insulin lispro, 1-3 Units, Subcutaneous, QHS  insulin lispro, 1-5 Units, Subcutaneous, TID AC  letrozole, 2.5 mg, Oral, Daily  lisinopril, 2.5 mg, Oral, Q12H SCH  metFORMIN, 500 mg, Oral, BID Meals  nicotine, 1 patch, Transdermal, Daily  senna-docusate, 2 tablet, Oral, QHS  sodium chloride, 1 g, Oral, BID  tiotropium, 2 puff, Inhalation, QAM  vitamin D (cholecalciferol), 25 mcg, Oral, Daily      Continuous Infusions:   acetaminophen, 1,000 mg, Q6H PRN  albuterol, 2.5 mg, Q6H PRN  alum & mag hydroxide-simethicone, 30 mL, Q4H PRN  bisacodyl, 10 mg, Daily PRN  bisacodyl, 10 mg, Daily PRN  glucagon (rDNA), 1 mg, PRN   And  dextrose, 250 mL, PRN   And  dextrose, 25 g, PRN   And  dextrose, 25 g, PRN  melatonin, 3 mg, QHS PRN  ondansetron, 4 mg, Q8H PRN   Or  ondansetron, 4 mg, Q8H  PRN  oxyCODONE, 10 mg, Q4H PRN  oxyCODONE, 5 mg, Q4H PRN  polyethylene glycol, 17 g, Daily PRN  sodium phosphate, 1 enema, Daily PRN        Medication Review  A complete drug regimen review was completed: Yes  Were any drug issues found during review?: No          Review of Systems:   A comprehensive review of systems was: No fevers, chills, nausea, vomiting, chest pain, shortness of breath, cough, headache, double vision.  All others negative.    Physical Exam:     Vitals:    03/23/21 0818 03/23/21 1030 03/23/21 1410 03/23/21 1455   BP: 122/65 103/68 108/59 100/64   Pulse:  94 93 90   Resp:       Temp:       TempSrc:       SpO2:       Weight:       Height:           Intake and Output Summary (Last 24 hours) at Date Time    Intake/Output Summary (Last 24 hours) at 03/23/2021 1626  Last data filed at 03/23/2021 1200  Gross per 24 hour   Intake 480 ml   Output --   Net  480 ml       P.O.: 240 mL (03/23/21 1200)     Urine: 0 mL (03/21/21 0900)  Bladder Scan Volume (mL): 108 mL (03/19/21 0556)        No acute distress.    Chest rhythmic movement of chest with normal respirations.  Abdomen nondistended  Cardiac regular rhythm  Lungs some expiratory wheezing.  In no acute distress.    Labs:   No results for input(s): GLUCOSEWHOLE in the last 24 hours.    Recent Labs   Lab 03/22/21  0627 03/19/21  0608 03/17/21  0515   WBC 4.76 6.58 6.72   Hgb 12.5 13.2 13.1   Hematocrit 36.8 39.5 39.2   Platelets 266 299 209          Recent Labs   Lab 03/22/21  0627 03/20/21  0607 03/19/21  0608 03/17/21  0515   Sodium 132* 129* 129* 132*   Potassium 4.2 4.6 4.2 4.4   Chloride 95* 94* 91* 96*   CO2 27 26 28 27    BUN 8.0 8.0 8.0 9.0   Creatinine 0.5* 0.5* 0.6 0.5*   Calcium 8.7 8.8 8.7 8.8   Albumin 3.2* 3.2* 3.2* 3.1*   Protein, Total 5.6* 5.4* 5.5* 5.2*   Bilirubin, Total 0.4 0.4 0.5 0.3   Alkaline Phosphatase 175* 237* 234* 91   ALT 48 106* 118* 25   AST (SGOT) 18 65* 100* 23   Glucose 104* 101* 135* 159*               Results        Procedure Component Value Units Date/Time    Glucose Whole Blood - POCT [[id number]]  (Abnormal) Collected: 03/23/21 1108     Updated: 03/23/21 1119     Whole Blood Glucose POCT 128 mg/dL     Glucose Whole Blood - POCT [[id number]]  (Abnormal) Collected: 03/23/21 0555     Updated: 03/23/21 0605     Whole Blood Glucose POCT 121 mg/dL     Glucose Whole Blood - POCT [[id number]]  (Abnormal) Collected: 03/22/21 2109     Updated: 03/22/21 2115     Whole Blood Glucose POCT 134 mg/dL     Glucose Whole Blood - POCT [[id number]]  (Abnormal) Collected: 03/22/21 1641     Updated: 03/22/21 1659     Whole Blood Glucose POCT 113 mg/dL                  Rads:   Radiological Procedure reviewed.  Radiology Results (24 Hour)       ** No results found for the last 24 hours. **                Assessment and Plan:     Ambulation and activities of daily living dysfunction    L1 and T11 compression fractures  -- Continue with PT and OT.  Follow endurance.  Follow gains.  -- Estimated length of stay to go home if medically stable is June 30    Transaminitis: Resolved  -- 6/20 patient is taking Tylenol 1000 mg perhaps once a day as needed for pain.  We will discontinue Crestor 40 mg p.o. nightly.  Follow daily CMP's for now.  -- 6/21 improving trend  --6/23 Resolved.  Consider restarting a lower dose of Crestor.    Hypertension  -- Continue regimen.  Continue to monitor.  Some lability noted.  -- 6/21 low blood pressures noted.  Currently on lisinopril 10 mg daily.  Will change lisinopril to 2.5 mg twice daily.  -- 6/22 continue current lisinopril 2.5 mg twice daily.  Blood pressures are improved    Hypercholesterolemia  -- 6/20 hold Crestor 40 mg nightly because of elevated LFTs.  Continue to follow LFTs.    Diabetes mellitus type II with hyperglycemia  --6/20: Continue to monitor.    -- Controlled blood glucose levels.  Consult hospitalist medicine.    Hyponatremia  --6/20 possibly due to oral intake.  Continue to monitor.  Encourage oral  intake.  Follow daily CMP for now.  --6/21 sodium level stable 129.  Patient reports that as an outpatient she was on fluid restriction which she observing now.  --6/22 consult nephrology.  -- 6/23 appreciate nephrology efforts.  Improved    COPD  -- No complaints of shortness of breath.  Follow trend  --6/24 patient noticed some wheezing.  Continue with regimen.  Will order nebulizer as needed.  Consult hospitalist medicine.    Allergic rhinitis with rhinorrhea  -- 6/23 trial of Zyrtec and Flonase.  -- 6/24 patient reports improvement overall.    History of lung cancer    History of breast cancer    History of nicotine dependence     DVT prophylaxis: Ambulation and heparin 5000 units subcu every 8 hours  --6/19: Negative bilateral lower limb venous duplex for DVT    Patient Active Problem List   Diagnosis    Lumbar compression fracture, closed, initial encounter       Continue comprehensive and intensive inpatient rehab program, including:   Physical therapy 60-120 min daily, 5-6 times per week, Occupational therapy 60-120 min daily, 5-6 times per week, Case management and Rehabilitation nursing    Signed by: Virl Cagey, MD    [facility]    If there are questions or concerns about the content of this note or information contained within the body of this dictation they should be addressed directly with the author for clarification.

## 2021-03-23 NOTE — Group Note (Signed)
Rehab - Mobility  Inpatient Rehabilitation Group Note    Patient Name:  [name] [name]       Medical Record Number: [id number]   Date of Birth: 05/03/1951  Sex: Female          Rehab Diagnosis: Lumbar compression fracture, closed, initial encounter [S32.000A]     Today's Date: 03/23/2021   Group Start Time: 1400   Group End Time: 1500   Group Facilitators: Montez Hageman, OT  Group Topic: Rehab - Mobility  Discipline Led: Occupational Therapy  Number of Patient Participants: 2      Treatment Provided  Patient received up in w/c, handoff from transport. TLSO donned. Patient reporting 5/10 pain currently, and requesting no standing activities due to fatigue from previous sessions. Patient agreeable to participate in stretching and strengthening exercises in group format. Completes the following:   -Seated marches x 20 reps with BUE support on arm rests for lumbar support.   -BUE stretches in sitting with adherence to spinal precautions  -Targeted BUE strength and endurance with BUE there-ex using #2lb weights targeting major mm. Groups of UE's, including wrist flexion/extension 2x10 reps with rest breaks and cues for coordination of breathing.   -BLE stretching with use of sheet to facilitate hamstring stretch in sitting, with adherence to spinal precautions. Patient w/improved tolerance to stretching with LE's propped on surface vs. Using full tension of sheet.     Patient's BP noted to be low post-activity (100/64), therefore, transported back to room by this writer and repositioned to bed, CGA for SPT with RW. Patient's BP noted to increase in supine (122/76). Encouraged fluids. RN was updated on disposition and BP.   See Education Activity for details.    Assessment  Benefited from support and encouragement of peers in a social setting  Participant(s) asked appropriate questions  Participant(s) shared personal experiences  Participant(s) had good participation

## 2021-03-23 NOTE — Progress Notes (Signed)
Renal brief note  Labs reviewed  Serum sodium stable at 132 mEq.  Continue sodium chloride pills  Continue current blood pressure medications  Fluid restriction to 1.5 L in 24 hours  We will sign off at this time  Please call with any questions

## 2021-03-23 NOTE — Plan of Care (Signed)
Problem: Bladder Management  Goal: LTG: Patient will be able to void spontaneously with PVR of less than 100 cc.   Outcome: Progressing  Patient is voiding and PVR is usually less than     Problem: Endocrine  Goal: LTG: Prior D/C patient will demonstrate the ability to self manage diabetes at home and what to do when symptoms arise.  Outcome: Progressing     Problem: Pain Management  Goal: LTG: Pain will verbalized adequate pain control with scheduled/ current pain medication prior to D/C.  Outcome: Progressing  Patient pain is being managed with scheduled     Problem: Safety Risk  Goal: LTG: Patient will call for assistance for out of bed activities and remain free from falls through hospitalization.  Outcome: Progressing  Patient calls appropriately for assistance

## 2021-03-23 NOTE — PT Progress Note (Signed)
Physical Therapy  Inpatient Rehabilitation Daily Progress Note    Patient Name:  [name] [name]       Medical Record Number: [id number]   Date of Birth: [birth year]  Sex: Female          Room/Bed:  M502/M502-01    Rehabilitation Precautions/Restrictions:  Weight Bearing Status: no restrictions  Back Brace Applied: OOB (TLSO)  Spinal Precautions: no bending, no twisting, no lifting  Other Precautions: Falls    Rehab Diagnosis: Lumbar compression fracture, closed, initial encounter [S32.000A]     Subjective   Patient Report: Pt agreeable to therapy.   Patient/Caregiver Goals: Be in less pain and be able to move.  Pain Assessment:  Pain Assessment: No/denies pain    Objective   Vitals:  Heart Rate: 94  BP: 103/68  BP Location: Left arm  MAP (mmHg): 80  Patient Position: Sitting    Interventions: Patient perform 10 minutes on new step, gear zero, very slow pace, gear 0, 0.73mi, 8cal, 730 steps. Patient then ambulated 20 minutes around the first floor gym, up to and around the 5th floor Including some retro ambulation, roughly 800 to 1000'.       Evaluation:                  Assessments:      Education Documentation  No documentation found.  Education Comments  No comments found.         Assessment & Plan   Patient continues to demonstrate very slow mobility however it is safe and approaching independence. Patient appears unsteady maneuvering backwards with rolling walker but did not demonstrate LOB. Continue POC to improve independence with mobility.     Plan:  Risks/Benefits/POC Discussed with Pt/Family: With patient  Treatment/Interventions: Exercise, Gait training, Stair training, Neuromuscular re-education, Functional transfer training, LE strengthening/ROM, Endurance training, Patient/family training, Equipment eval/education    Recommendation:  Discharge Recommendation: Home with home health PT  PT Therapy Recommendation: 5-6 days/week, 60-120 mins/day, 1:1 treatment  PT Estimated Length of Stay: 10 days from IE on  03/17/21

## 2021-03-23 NOTE — Progress Notes (Signed)
Case Management  Inpatient Rehabilitation Progress Note    CM met with patient at bedside to discuss plan of care. Patient confirmed her discussion with Attending MD related to medical stability to fly home to [facility]. CM provided feedback from Primary Therapy team who recommend curb side check in and transport chair assist through security to the gate, then assist from family/friend in [place] for navigation through the destination airport and to home. Patient expressed her understanding. As her daughter is facilitating her own move today, patient requests time to discuss with her so they can coordinate arrangements. CM agreed to follow up Monday. Anticipate patient will discharge locally for a few days to a week prior to returning to [facility]     Post Acute Care Coordinator following for home health agency choice in [place]. CM will continue to follow.     Arlie Solomons, MSW, ACM-SW  Social Worker Case Manager II  [facility] / Acute Rehabilitation  [phone]

## 2021-03-23 NOTE — OT Progress Note (Signed)
Occupational Therapy  Inpatient Rehabilitation Daily Progress Note    Patient Name:  [name] [name]       Medical Record Number: [id number]   Date of Birth: 02/18/1951  Sex: Female          Room/Bed:  M502/M502-01    Rehabilitation Precautions/Restrictions:  Weight Bearing Status: no restrictions  Back Brace Applied: OOB (TLSO)  Spinal Precautions: no bending, no twisting, no lifting  Other Precautions: Falls    Rehab Diagnosis: Lumbar compression fracture, closed, initial encounter [S32.000A]     Subjective   Patient Report: "I don't know why I'm so tired today."   Patient/Caregiver Goals: Be in less pain and be able to move.  Pain Assessment:  Pain Assessment: No/denies pain    Objective   Interventions:   Pt received seated upright in w/c and reporting significant fatigue after walk with PT during previous session. Pt requested to engage in OT tx in seated position, therefore engaged in prolonged UB exercises at [facility] to promote increased cardiopulmonary endurance and general reconditioning necessary for returned engagement in daily routine at home. Pt completed 3x10-15 reps of exercises targeting major UB muscle groups. Brief rest breaks required between sets 2/2 fatigue as well as intermittent tactile cues for improved posture/positioning. Light sweat noted and pt reported fatigue by end of session. Educated on role of nutrition in bone healing and purpose of spinal precautions/brace in promoting healing.     Assessment & Plan   Pt demonstrates good engagement in UB exercises, however generally deconditioned as demonstrated by limited tolerance with 2lb weights. Pt will benefit from HEP to increase strength and postural support.     Plan:  Risks/Benefits/POC Discussed with Pt/Family: With patient  Treatment Interventions: ADL retraining, Functional transfer training, UE strengthening/ROM, Endurance training, Patient/Family training, Equipment eval/education, Compensatory technique  education    Recommendation:  Discharge Recommendation: Home with supervision, Home with home health OT  OT Therapy Recommendation: 5-6 days/week, 60-120 mins/day  OT Estimated Length of Stay: 10-14 days from IE (03/17/21)

## 2021-03-24 DIAGNOSIS — E871 Hypo-osmolality and hyponatremia: Secondary | ICD-10-CM

## 2021-03-24 DIAGNOSIS — E119 Type 2 diabetes mellitus without complications: Secondary | ICD-10-CM

## 2021-03-24 DIAGNOSIS — I10 Essential (primary) hypertension: Secondary | ICD-10-CM

## 2021-03-24 DIAGNOSIS — J441 Chronic obstructive pulmonary disease with (acute) exacerbation: Secondary | ICD-10-CM

## 2021-03-24 LAB — GLUCOSE WHOLE BLOOD - POCT
Whole Blood Glucose POCT: 111 mg/dL — ABNORMAL HIGH (ref 70–100)
Whole Blood Glucose POCT: 146 mg/dL — ABNORMAL HIGH (ref 70–100)
Whole Blood Glucose POCT: 128 mg/dL — ABNORMAL HIGH (ref 70–100)
Whole Blood Glucose POCT: 137 mg/dL — ABNORMAL HIGH (ref 70–100)

## 2021-03-24 NOTE — Plan of Care (Signed)
Problem: Bladder Management  Goal: LTG: Patient will be able to void spontaneously with PVR of less than 100 cc.   Outcome: Progressing     Problem: Endocrine  Goal: LTG: Prior D/C patient will demonstrate the ability to self manage diabetes at home and what to do when symptoms arise.  Outcome: Progressing     Problem: Pain Management  Goal: LTG: Pain will verbalized adequate pain control with scheduled/ current pain medication prior to D/C.  Outcome: Progressing     Problem: Safety Risk  Goal: LTG: Patient will call for assistance for out of bed activities and remain free from falls through hospitalization.  Outcome: Progressing

## 2021-03-24 NOTE — Plan of Care (Signed)
Problem: Pain Management  Goal: LTG: Pain will verbalized adequate pain control with scheduled/ current pain medication prior to D/C.  Outcome: Progressing  Note: Pt c/o lower back pain during the shift. Scheduled pain medication given with good effect.

## 2021-03-24 NOTE — Progress Notes (Signed)
IRF Physiatry Attending Face to Face Progress  Note  .[x] No new events    Subjective:  Feels well;  has no new complaints.  No headache. No shortness of breath. No chest pain. No constipation.      Objective:  Vitals: BP 125/59    Pulse 97    Temp 98.2 F (36.8 C) (Oral)    Resp 18    Ht 1.651 m (5\' 5" )    Wt 60 kg (132 lb 4.4 oz)    SpO2 91%    BMI 22.01 kg/m   Appears well.In no apparent distress.Resting comfortably   Awake ; normal mood and affect.Sclerae anicteric.   Moist mucous membranes.   No respiratory distress .    Auscultation: Heart S1, S2, Chest CTAB, abd soft, normal BS      New labs   Results       Procedure Component Value Units Date/Time    Glucose Whole Blood - POCT [[id number]]  (Abnormal) Collected: 03/24/21 1111     Updated: 03/24/21 1113     Whole Blood Glucose POCT 146 mg/dL     Glucose Whole Blood - POCT [[id number]]  (Abnormal) Collected: 03/24/21 0633     Updated: 03/24/21 0635     Whole Blood Glucose POCT 111 mg/dL     Glucose Whole Blood - POCT [[id number]]  (Abnormal) Collected: 03/23/21 2041     Updated: 03/23/21 2043     Whole Blood Glucose POCT 124 mg/dL     Glucose Whole Blood - POCT [[id number]]  (Abnormal) Collected: 03/23/21 1638     Updated: 03/23/21 1651     Whole Blood Glucose POCT 168 mg/dL             Medications:     Current Facility-Administered Medications   Medication Dose Route Frequency    calcium citrate-vitamin D  1 tablet Oral Daily with lunch    cetirizine  10 mg Oral Daily    famotidine  20 mg Oral Daily with dinner    fluticasone  1 spray Each Nare BID    heparin (porcine)  5,000 Units Subcutaneous Q8H SCH    insulin glargine  10 Units Subcutaneous QHS    insulin lispro  1-3 Units Subcutaneous QHS    insulin lispro  1-5 Units Subcutaneous TID AC    letrozole  2.5 mg Oral Daily    lisinopril  2.5 mg Oral Q12H SCH    metFORMIN  500 mg Oral BID Meals    nicotine  1 patch Transdermal Daily    senna-docusate  2 tablet Oral QHS    sodium chloride  1 g Oral BID     tiotropium  2 puff Inhalation QAM    vitamin D (cholecalciferol)  25 mcg Oral Daily         Medication Review  A complete drug regimen review was completed: yes  Were drug issues were found during review: no    If yes to drug issues found during review:      Assessment/Plan: 70 y.o. female  with dysfunction of mobility/ ADL due to Lumbar compression fracture, closed, initial encounter  Continue comprehensive intensive inpatient rehab program. Medically stable. Continue current management.         Signed by: Cheryll Dessert, MD MD  03/24/2021, 4:33 PM    [facility]

## 2021-03-24 NOTE — Consults (Signed)
MEDICINE NEW CONSULT  [facility], DIVISION OF HOSPITALIST MEDICINE   [facility]   [place] Pager: [phone]      Date Time: 03/24/21 12:20 PM  Patient Name: [name] [name]  Requesting Physician: Virl Cagey, MD  Consulting Physician: Laretta Bolster, MD    Primary Care Physician: Pcp, None, MD    Reason for Consultation: Evaluation and medical co-management of this 70 year old female in acute rehab.    Assessment:     Active Hospital Problems    Diagnosis    Lumbar compression fracture, closed, initial encounter     Recommendations:     COPD exacerbation/wheezing/likely exacerbated by allergies  -- Patient feels much better after nebulizer treatment  --We will continue to use as needed nebulizers  --On exam no wheezing noted, hold off using steroids  --Patient lives in the [place] area and feels she may be having allergies  --Zyrtec  --Continue Spiriva  --Flonase    Recent fracture T11 and L1 vertebral bodies  --Pain control  --PT/OT    History of hypertension  --Blood pressures well controlled  -- Continue low-dose aspirin 2.5 mg every 12 hours    History of diabetes mellitus type 2  --Lantus 10 units at bedtime  --On metformin 500 mg twice a day  --Sliding scale insulin coverage  --Accu-Cheks before every meal and at bedtime    Hyponatremia  --Continue sodium chloride tablets 1 g twice daily    Discussed with patient, RN    Virl Cagey, MD, thank you for this consultation.  We will follow the patient with you during this hospitalization.  Please contact me with any questions or new issues.    History of Presenting Illness:   [name] [name] is a 70 y.o. female who was admitted to acute rehab on 6/17, has a history of hypertension, diabetes mellitus type 2 with hyperglycemia, hyperlipidemia, COPD, lung cancer, breast cancer nicotine dependence had presented to [facility] emergency room with back pain was found to have abnormal edema within the enhancement including T10, T11, L1 and L2 vertebral  bodies.  There was superior endplate fracture involving T11 and L1 vertebral body with vertebral body height loss.  Patient also had hyponatremia which had resolved.  Originally from [place] patient was visiting her daughter in [place].  Admitted to acute rehab for further physical therapy.  Medicine was consulted because patient had complaints of wheezing.  No fever or chills no chest pain or shortness of breath.  Cough present, clear expectoration.    Past Medical History:     Past Medical History:   Diagnosis Date    Breast cancer     COPD (chronic obstructive pulmonary disease)     Diabetes mellitus     Hypertension     Lung cancer      additional Hx     Available old records reviewed, including: EPIC      Past Surgical History:     Past Surgical History:   Procedure Laterality Date    BREAST LUMPECTOMY         Family History:   No family history on file., otherwise reviewed and non contributory     Social History:    reports that she has quit smoking. Her smoking use included cigarettes. She has been exposed to tobacco smoke. She does not have any smokeless tobacco history on file. She reports that she does not currently use alcohol. She reports that she does not use drugs.    Allergies:  Allergies   Allergen Reactions    Erythromycin Itching and Rash       Medications:     No medications prior to admission.       Current Facility-Administered Medications   Medication Dose Route Frequency    calcium citrate-vitamin D  1 tablet Oral Daily with lunch    cetirizine  10 mg Oral Daily    famotidine  20 mg Oral Daily with dinner    fluticasone  1 spray Each Nare BID    heparin (porcine)  5,000 Units Subcutaneous Q8H SCH    insulin glargine  10 Units Subcutaneous QHS    insulin lispro  1-3 Units Subcutaneous QHS    insulin lispro  1-5 Units Subcutaneous TID AC    letrozole  2.5 mg Oral Daily    lisinopril  2.5 mg Oral Q12H SCH    metFORMIN  500 mg Oral BID Meals    nicotine  1 patch Transdermal Daily     senna-docusate  2 tablet Oral QHS    sodium chloride  1 g Oral BID    tiotropium  2 puff Inhalation QAM    vitamin D (cholecalciferol)  25 mcg Oral Daily            Review of Systems:   All other systems were reviewed and are negative except as above in HPI     Physical Exam:   Patient Vitals for the past 24 hrs:   BP Temp Temp src Pulse Resp SpO2   03/24/21 1151 125/59 -- -- 97 -- --   03/24/21 1121 101/57 -- -- 97 -- --   03/24/21 0804 133/71 -- -- 77 -- 91 %   03/24/21 0802 133/71 -- -- -- -- --   03/24/21 0612 129/69 -- -- 80 -- 94 %   03/24/21 0544 115/65 98.2 F (36.8 C) Oral 76 18 (!) 88 %   03/23/21 2222 122/71 -- -- 77 -- 98 %   03/23/21 2222 122/71 -- -- -- -- --   03/23/21 1641 127/70 98.1 F (36.7 C) Oral 90 19 92 %   03/23/21 1455 100/64 -- -- 90 -- --   03/23/21 1410 108/59 -- -- 93 -- --     Body mass index is 22.01 kg/m.    Intake/Output Summary (Last 24 hours) at 03/24/2021 1220  Last data filed at 03/24/2021 1610  Gross per 24 hour   Intake 600 ml   Output --   Net 600 ml       General:  Appears well-developed and well-nourished female, in no acute distress   Eyes: Extraocular movements intact. Conjunctivae are normal. Pupils are equal, round, and reactive to light and accommodation. No scleral icterus.  Head: Normocephalic.   Neck: Neck supple. Normal carotid pulses present. No lymphadenopathy. No thyromegaly.   Cardiovascular: Normal rate, regular rhythm, S1 normal and S2 normal. Exam reveals no gallop and no friction rub.  No murmur heard.  Pulmonary/Chest: Effort normal and breath sounds normal. Clear to auscultation bilaterally. No wheezes, rhonchi or rales.   Abdominal: Soft. Bowel sounds present. No distension. No abdominal tenderness. No hepatosplenomegaly.  Extremities: no clubbing, cyanosis, or edema   Peripheral Pulses: Dorsalis pedis and posterior tibial pulses are 2+ bilaterally.   Skin: No rash noted   Neurological: alert, oriented x 3,moves all ext   Psychiatric: Alert and oriented  to person, place, and time. Cognitive function is intact.     Labs:     Recent Labs  03/22/21  0627   WBC 4.76   Hgb 12.5   Hematocrit 36.8   Platelets 266       Recent Labs     03/22/21  0627   Sodium 132*   Potassium 4.2   Chloride 95*   CO2 27   BUN 8.0   Creatinine 0.5*   Glucose 104*   Calcium 8.7       Recent Labs     03/22/21  0627   AST (SGOT) 18   ALT 48   Alkaline Phosphatase 175*   Protein, Total 5.6*   Albumin 3.2*   Bilirubin, Total 0.4       No results for input(s): PTT, PT, INR in the last 72 hours.            Imaging personally reviewed, including: No results found.      Signed by: Laretta Bolster, MD    cc: Virl Cagey, MD  Pcp, None, MD

## 2021-03-24 NOTE — Group Note (Signed)
Rehab - OT Group  Inpatient Rehabilitation Group Note    Patient Name:  [name] [name]       Medical Record Number: [id number]   Date of Birth: [birth year]  Sex: Female          Rehab Diagnosis: Lumbar compression fracture, closed, initial encounter [S32.000A]     Today's Date: 03/20/2021   Group Start Time: 1100   Group End Time: 1200   Group Facilitators: Davonna Belling, OT  Group Topic: Rehab - OT Group  Discipline Led: Occupational Therapy  Number of Patient Participants: 3    Treatment Provided  Pt participated in seated table top activity of Fall Prevention Bingo. Education provided on behaviors, indoor/outdoor modifications, nutrition, and activities that help to decrease fall risk. Pt participatory throughout providing personal experiences and listening to others shared experiences.     Pt then completed BUE strengthening where she completed 2 sets of 30s of shoulder press, chest press, and bicep curls using 2-4# weight. She completed AROM stretches led by OT targeting BUE and BLE adhering to spinal precautions throughout.  See Education Activity for details.    Assessment  Benefited from support and encouragement of peers in a social setting  Participant(s) asked appropriate questions  Participant(s) shared personal experiences  Participant(s) had good participation  Benefited from psychosocial stimulation in a group setting  Benefited from reminiscing and sharing personal stories

## 2021-03-25 LAB — GLUCOSE WHOLE BLOOD - POCT
Whole Blood Glucose POCT: 114 mg/dL — ABNORMAL HIGH (ref 70–100)
Whole Blood Glucose POCT: 117 mg/dL — ABNORMAL HIGH (ref 70–100)
Whole Blood Glucose POCT: 170 mg/dL — ABNORMAL HIGH (ref 70–100)
Whole Blood Glucose POCT: 155 mg/dL — ABNORMAL HIGH (ref 70–100)
Whole Blood Glucose POCT: 147 mg/dL — ABNORMAL HIGH (ref 70–100)

## 2021-03-25 NOTE — Progress Notes (Signed)
IRF Physiatry Attending Face to Face Progress  Note  .[x] No new events    Subjective:  Feels well;  has no new complaints.  No headache. No shortness of breath. No chest pain. No constipation.      Objective:  Vitals: BP 123/51   Pulse 85   Temp 98.2 F (36.8 C) (Oral)   Resp 19   Ht 1.651 m (5\' 5" )   Wt 59.2 kg (130 lb 8.2 oz)   SpO2 92%   BMI 21.72 kg/m   Appears well.In no apparent distress.Resting comfortably   Awake ; normal mood and affect.Sclerae anicteric.   Moist mucous membranes.   No respiratory distress .    Auscultation: Heart S1, S2, Chest CTAB, abd soft, normal BS      New labs   Results       Procedure Component Value Units Date/Time    Glucose Whole Blood - POCT [[id number]]  (Abnormal) Collected: 03/25/21 0648     Updated: 03/25/21 0650     Whole Blood Glucose POCT 117 mg/dL     Glucose Whole Blood - POCT [[id number]]  (Abnormal) Collected: 03/24/21 2047     Updated: 03/24/21 2051     Whole Blood Glucose POCT 137 mg/dL     Glucose Whole Blood - POCT [[id number]]  (Abnormal) Collected: 03/24/21 1637     Updated: 03/24/21 1652     Whole Blood Glucose POCT 128 mg/dL             Medications:     Current Facility-Administered Medications   Medication Dose Route Frequency    calcium citrate-vitamin D  1 tablet Oral Daily with lunch    cetirizine  10 mg Oral Daily    famotidine  20 mg Oral Daily with dinner    fluticasone  1 spray Each Nare BID    heparin (porcine)  5,000 Units Subcutaneous Q8H SCH    insulin glargine  10 Units Subcutaneous QHS    insulin lispro  1-3 Units Subcutaneous QHS    insulin lispro  1-5 Units Subcutaneous TID AC    letrozole  2.5 mg Oral Daily    lisinopril  2.5 mg Oral Q12H SCH    metFORMIN  500 mg Oral BID Meals    nicotine  1 patch Transdermal Daily    senna-docusate  2 tablet Oral QHS    sodium chloride  1 g Oral BID    tiotropium  2 puff Inhalation QAM    vitamin D (cholecalciferol)  25 mcg Oral Daily         Medication Review  A complete drug regimen review was  completed: yes  Were drug issues were found during review: no    If yes to drug issues found during review:      Assessment/Plan: 70 y.o. female  with dysfunction of mobility/ ADL due to Lumbar compression fracture, closed, initial encounter  Continue comprehensive intensive inpatient rehab program. Medically stable. Continue current management.     Wants to be discharged so she can catch a plane     Signed by: Cheryll Dessert, MD MD  03/25/2021, 12:09 PM    [facility]

## 2021-03-25 NOTE — Progress Notes (Signed)
MEDICINE CONSULT FOLLOW-UP    Date Time: 03/25/21 4:04 PM  Patient Name: [facility]  Attending Physician: Virl Cagey, MD  Consulting Physician: Laretta Bolster, MD, MD      Assessment:     Active Hospital Problems    Diagnosis    Lumbar compression fracture, closed, initial encounter   COPD exacerbation  Hypertension  Diabetes mellitus type 2  Hyperlipidemia    70 y.o. female who was admitted to acute rehab on 6/17, has a history of hypertension, diabetes mellitus type 2 with hyperglycemia, hyperlipidemia, COPD, lung cancer, breast cancer nicotine dependence had presented to [facility] emergency room with back pain was found to have abnormal edema within the enhancement including T10, T11, L1 and L2 vertebral bodies.  There was superior endplate fracture involving T11 and L1 vertebral body with vertebral body height loss.  She had COPD exacerbation.    Recommendations:     COPD exacerbation/wheezing/likely exacerbated by allergies  -- Continue nebulizer treatment  -- Patient feels much better  -- No further wheezing on exam  --Patient lives in the [place] area and feels she may be having allergies  --Zyrtec  --Continue Spiriva  --Flonase  --No indication for systemic steroids     Recent fracture T11 and L1 vertebral bodies  --Pain control  --PT/OT     History of hypertension  --Blood pressures well controlled  --Continue lisinopril 2.5 mg every 12 hours     History of diabetes mellitus type 2  --Lantus 10 units at bedtime  --On metformin 500 mg twice a day  --Sliding scale insulin coverage  --Accu-Cheks before every meal and at bedtime  --Reviewed Accu-Cheks-well-controlled     Hyponatremia  --Continue sodium chloride tablets 1 g twice daily  --Sodium 132  --Labs to be drawn tomorrow morning    Discussed with patient, staff      Subjective:     CC: follow-up: COPD exacerbation, lumbar compression fracture    HPI/Subjective: Patient feels much better.  Denies any wheezing or shortness of breath.  No fever or chills.   Cough present    Review of Systems:   Review of Systems - negative except as above in HPI    Physical Exam:   Temp:  [98.2 F (36.8 C)] 98.2 F (36.8 C)  Heart Rate:  [85] 85  Resp Rate:  [19] 19  BP: (123-143)/(51-82) 123/51  Body mass index is 21.72 kg/m.    Intake/Output Summary (Last 24 hours) at 03/25/2021 1604  Last data filed at 03/25/2021 0600  Gross per 24 hour   Intake 480 ml   Output 0 ml   Net 480 ml     Weight Monitoring 03/16/2021 03/18/2021 03/25/2021   Height 165.1 cm - -   Weight 60.7 kg 60 kg 59.2 kg   Weight Method - Bed Scale -   BMI (calculated) 22.3 kg/m2 - -         General: awake, in no acute distress.  Cardiovascular: regular rate and rhythm, no murmurs, rubs or gallops  Lungs: clear to auscultation bilaterally, without wheezing, rhonchi, or rales  Abdomen: soft, non-tender, non-distended; no palpable masses, no hepatosplenomegaly, normoactive bowel sounds, no rebound or guarding  Extremities: no clubbing, cyanosis, or edema  Neuro- alert, oriented x 3      Meds:   Medications were reviewed:  Scheduled Meds:  Current Facility-Administered Medications   Medication Dose Route Frequency    calcium citrate-vitamin D  1 tablet Oral Daily with lunch    cetirizine  10 mg Oral Daily    famotidine  20 mg Oral Daily with dinner    fluticasone  1 spray Each Nare BID    heparin (porcine)  5,000 Units Subcutaneous Q8H SCH    insulin glargine  10 Units Subcutaneous QHS    insulin lispro  1-3 Units Subcutaneous QHS    insulin lispro  1-5 Units Subcutaneous TID AC    letrozole  2.5 mg Oral Daily    lisinopril  2.5 mg Oral Q12H SCH    metFORMIN  500 mg Oral BID Meals    nicotine  1 patch Transdermal Daily    senna-docusate  2 tablet Oral QHS    sodium chloride  1 g Oral BID    tiotropium  2 puff Inhalation QAM    vitamin D (cholecalciferol)  25 mcg Oral Daily     Continuous Infusions:  PRN Meds:.acetaminophen, albuterol, alum & mag hydroxide-simethicone, bisacodyl, bisacodyl, Nursing communication: Adult  Hypoglycemia Treatment Algorithm **AND** glucagon (rDNA) **AND** dextrose **AND** dextrose **AND** dextrose, melatonin, ondansetron **OR** ondansetron, oxyCODONE, oxyCODONE, polyethylene glycol, sodium phosphate    Labs:     Recent Labs     03/25/21  0648 03/24/21  2047 03/24/21  1637   Whole Blood Glucose POCT 117* 137* 128*     Recent Labs   Lab 03/22/21  0627 03/20/21  0607 03/19/21  0608   Sodium 132* 129* 129*   Potassium 4.2 4.6 4.2   Chloride 95* 94* 91*   CO2 27 26 28    BUN 8.0 8.0 8.0   Creatinine 0.5* 0.5* 0.6   EGFR >60.0 >60.0 >60.0   Glucose 104* 101* 135*   Calcium 8.7 8.8 8.7         Recent Labs   Lab 03/22/21  0627 03/19/21  0608   WBC 4.76 6.58   Hgb 12.5 13.2   Hematocrit 36.8 39.5   Platelets 266 299     Recent Labs   Lab 03/22/21  0627 03/20/21  0607   ALT 48 106*   AST (SGOT) 18 65*   Bilirubin, Total 0.4 0.4   Alkaline Phosphatase 175* 237*                 Imaging personally reviewed, including: all available  No results found.      This note was generated by the Epic EMR system/ Dragon speech recognition and may contain inherent errors or omissions not intended by the user. Grammatical errors, random word insertions, deletions and pronoun errors  are occasional consequences of this technology due to software limitations. Not all errors are caught or corrected. If there are questions or concerns about the content of this note or information contained within the body of this dictation they should be addressed directly with the author for clarification.    Signed by: Laretta Bolster, MD

## 2021-03-25 NOTE — Plan of Care (Signed)
Problem: Bladder Management  Goal: LTG: Patient will be able to void spontaneously with PVR of less than 100 cc.   Outcome: Progressing  Note: Mrs. [name] [name] has been voiding continently without retaining urine.

## 2021-03-25 NOTE — Plan of Care (Signed)
Problem: Bladder Management  Goal: LTG: Patient will be able to void spontaneously with PVR of less than 100 cc.   Outcome: Progressing     Problem: Pain Management  Goal: LTG: Pain will verbalized adequate pain control with scheduled/ current pain medication prior to D/C.  Outcome: Progressing     Problem: Safety Risk  Goal: LTG: Patient will call for assistance for out of bed activities and remain free from falls through hospitalization.  Outcome: Progressing

## 2021-03-26 LAB — GLUCOSE WHOLE BLOOD - POCT
Whole Blood Glucose POCT: 135 mg/dL — ABNORMAL HIGH (ref 70–100)
Whole Blood Glucose POCT: 155 mg/dL — ABNORMAL HIGH (ref 70–100)
Whole Blood Glucose POCT: 155 mg/dL — ABNORMAL HIGH (ref 70–100)
Whole Blood Glucose POCT: 180 mg/dL — ABNORMAL HIGH (ref 70–100)

## 2021-03-26 LAB — COMPREHENSIVE METABOLIC PANEL
ALT: 15 U/L (ref 0–55)
AST (SGOT): 10 U/L (ref 5–34)
Albumin/Globulin Ratio: 1.3 (ref 0.9–2.2)
Albumin: 3.3 g/dL — ABNORMAL LOW (ref 3.5–5.0)
Alkaline Phosphatase: 138 U/L — ABNORMAL HIGH (ref 37–117)
Anion Gap: 8 (ref 5.0–15.0)
BUN: 6 mg/dL — ABNORMAL LOW (ref 7.0–19.0)
Bilirubin, Total: 0.6 mg/dL (ref 0.2–1.2)
CO2: 26 mEq/L (ref 22–29)
Calcium: 8.9 mg/dL (ref 8.5–10.5)
Chloride: 100 mEq/L (ref 100–111)
Creatinine: 0.5 mg/dL — ABNORMAL LOW (ref 0.6–1.0)
Globulin: 2.5 g/dL (ref 2.0–3.6)
Glucose: 117 mg/dL — ABNORMAL HIGH (ref 70–100)
Potassium: 4.4 mEq/L (ref 3.5–5.1)
Protein, Total: 5.8 g/dL — ABNORMAL LOW (ref 6.0–8.3)
Sodium: 134 mEq/L — ABNORMAL LOW (ref 136–145)

## 2021-03-26 LAB — CBC
Absolute NRBC: 0 10*3/uL (ref 0.00–0.00)
Hematocrit: 39.1 % (ref 34.7–43.7)
Hgb: 13.1 g/dL (ref 11.4–14.8)
MCH: 31.4 pg (ref 25.1–33.5)
MCHC: 33.5 g/dL (ref 31.5–35.8)
MCV: 93.8 fL (ref 78.0–96.0)
MPV: 10.5 fL (ref 8.9–12.5)
Nucleated RBC: 0 /100 WBC (ref 0.0–0.0)
Platelets: 312 10*3/uL (ref 142–346)
RBC: 4.17 10*6/uL (ref 3.90–5.10)
RDW: 13 % (ref 11–15)
WBC: 6.54 10*3/uL (ref 3.10–9.50)

## 2021-03-26 LAB — GFR: EGFR: >60

## 2021-03-26 MED ORDER — ROSUVASTATIN CALCIUM 10 MG PO TABS
10.0000 mg | ORAL_TABLET | Freq: Every evening | ORAL | Status: DC
Start: 2021-03-26 — End: 2021-03-29
  Administered 2021-03-26 – 2021-03-28 (×3): 10 mg via ORAL
  Filled 2021-03-26 (×3): qty 1

## 2021-03-26 NOTE — OT Progress Note (Signed)
Occupational Therapy  Inpatient Rehabilitation Daily Progress Note    Patient Name:  [name] [name]       Medical Record Number: [id number]   Date of Birth: Sep 15, 1951  Sex: Female          Room/Bed:  M502/M502-01    Rehabilitation Precautions/Restrictions:  Weight Bearing Status: no restrictions  Back Brace Applied: OOB (TLSO)  Spinal Precautions: no bending, no twisting, no lifting  Other Precautions: Falls    Rehab Diagnosis: Lumbar compression fracture, closed, initial encounter [S32.000A]     Subjective   Patient Report: "Yeah I've been in less pain"  Patient/Caregiver Goals: Be in less pain and be able to move.  Pain Assessment:  Pain Assessment: No/denies pain    Objective   Vitals:  Heart Rate: 90  BP: 120/71  MAP (mmHg): 88    Interventions:   Pt received seated upright in w/c, alert, and agreeable to OT tx. Discussed upcoming discharge and current concerns. Pt expressed apprehension returning home via plane immediately upon discharge. Discussed concerns, provided encouragement and collaborative problem solving, and pt agreeable to engage in DME assessment/recommendation in light of settled discharge disposition. Pt ambulated to/from rehab gym with FWW at slow pace without rest breaks. Pt completed tub transfer via side-step method using wall and shower chair arm rest for balance support as needed after initial demonstration from therapist. Trialed toilet transfer without AD, however pt unable to return to standing without use of UEs, requiring use of toilet safety rail for independence. Provided with handouts for recommended DME. Pt completed bed mobility on standard bed independently. Completed couch transfer independently. Pt demonstrated difficulty donning TLSO 2/2 inability to reach shoulder strap when unhooked, therefore therapist demonstrated overhead donning method. Pt carried over with supervision only. Appreciative of novel technique and expressed preference.    Assessment & Plan   Despite pt's  hesitation to return home, pt has demonstrated excellent progress toward goals with increased activity and pain tolerance. Will trial IADL completion during subsequent session in order to ensure safety and independence for discharge home without caregiver availability.    Plan:  Risks/Benefits/POC Discussed with Pt/Family: With patient  Treatment Interventions: ADL retraining, Functional transfer training, UE strengthening/ROM, Endurance training, Patient/Family training, Equipment eval/education, Compensatory technique education    Recommendation:  Discharge Recommendation: Home with supervision, Home with home health OT  OT Therapy Recommendation: 5-6 days/week, 60-120 mins/day  OT Estimated Length of Stay: 10-14 days from IE (03/17/21)

## 2021-03-26 NOTE — Group Note (Signed)
Rehab - Mobility  Inpatient Rehabilitation Group Note    Patient Name:  [name] [name]       Medical Record Number: [id number]   Date of Birth: 10/24/1950  Sex: Female          Rehab Diagnosis: Lumbar compression fracture, closed, initial encounter [S32.000A]     Today's Date: 03/26/2021   Group Start Time: 1400   Group End Time: 1500   Group Facilitators: Durenda Hurt, OT  Group Topic: Rehab - Mobility  Discipline Led: Occupational Therapy  Number of Patient Participants: 3      Treatment Provided  Session address  instruction in UE HEP with neck/shoulder stretches and resistance bands. OT provided modifications for spinal precautions. Participants discussed activity variables that may occur during inclement weather outings upon discharge. OT provided considerations and modifications for rainy days. Participants practiced item retrieval with AD and reacher, ambulating with casual supervision and picking up items from floor. OT recommended pouch for hands-free option.  See Education Activity for details.  See Education Activity for details.    Assessment  Benefited from support and encouragement of peers in a social setting  Participant(s) asked appropriate questions  Participant(s) shared personal experiences  Participant(s) had good participation

## 2021-03-26 NOTE — PT Progress Note (Addendum)
Physical Therapy  Inpatient Rehabilitation Daily Progress Note    Patient Name:  [name] [name]       Medical Record Number: [id number]   Date of Birth: [birth year]  Sex: Female          Room/Bed:  M502/M502-01    Rehabilitation Precautions/Restrictions:  Weight Bearing Status: no restrictions  Back Brace Applied: OOB (TLSO)  Spinal Precautions: no bending, no twisting, no lifting  Other Precautions: Falls    Rehab Diagnosis: Lumbar compression fracture, closed, initial encounter [S32.000A]     Subjective   Patient Report: Pt agreeable to therapy.  Patient/Caregiver Goals: Be in less pain and be able to move.  Pain Assessment:  Pain Assessment: No/denies pain    Objective   Vitals:  Heart Rate: (!) 101  BP: 127/78  MAP (mmHg): 94    Interventions: Pt and PT discussed pt's d/c plan, equipment needs, and mobility concerns. PT and pt then ordered pt's RW. Pt practiced car transfer with car setup to emulate an SUV with running board several times. Pt then practiced ramp with rw several times. PT also discussed pt's superior endplate fractures, showing illustrations and looking for pt's imaging which was not available in this system.     Evaluation:                  Assessments:      Education Documentation  No documentation found.  Education Comments  No comments found.         Assessment & Plan   Pt was able to perform car transfer safely and independently this session. Pt is prepared for retrun to [place] via plan upon D/C. Continue POC.    Plan:  Risks/Benefits/POC Discussed with Pt/Family: With patient  Treatment/Interventions: Exercise, Gait training, Stair training, Neuromuscular re-education, Functional transfer training, LE strengthening/ROM, Endurance training, Patient/family training, Equipment eval/education    Recommendation:  Discharge Recommendation: Home with home health PT  PT Therapy Recommendation: 5-6 days/week, 60-120 mins/day, 1:1 treatment  PT Estimated Length of Stay: 10 days from IE on 03/17/21

## 2021-03-26 NOTE — Progress Notes (Signed)
PHYSICAL MEDICINE AND REHABILITATION  PROGRESS NOTE -- FACE-TO-FACE ENCOUNTER    Date Time: 03/26/21 5:19 PM  Patient Name: [name] [name],[name] [name]    Admission date:  03/16/2021    Subjective:     Patient doing okay today, require pain medications, patient with moderate back pain.  Patient had no BM today, no leg pain and denies dysuria.    Functional Status:     Pt received seated upright in w/c, alert, and agreeable to OT tx. Discussed upcoming discharge and current concerns. Pt expressed apprehension returning home via plane immediately upon discharge. Discussed concerns, provided encouragement and collaborative problem solving, and pt agreeable to engage in DME assessment/recommendation in light of settled discharge disposition. Pt ambulated to/from rehab gym with FWW at slow pace without rest breaks. Pt completed tub transfer via side-step method using wall and shower chair arm rest for balance support as needed after initial demonstration from therapist. Trialed toilet transfer without AD, however pt unable to return to standing without use of UEs, requiring use of toilet safety rail for independence. Provided with handouts for recommended DME. Pt completed bed mobility on standard bed independently. Completed couch transfer independently. Pt demonstrated difficulty donning TLSO 2/2 inability to reach shoulder strap when unhooked, therefore therapist demonstrated overhead donning method. Pt carried over with supervision only. Appreciative of novel technique and expressed preference.      Medications:   Medication reviewed by me:     Scheduled Meds: PRN Meds:   calcium citrate-vitamin D, 1 tablet, Oral, Daily with lunch  cetirizine, 10 mg, Oral, Daily  famotidine, 20 mg, Oral, Daily with dinner  fluticasone, 1 spray, Each Nare, BID  heparin (porcine), 5,000 Units, Subcutaneous, Q8H SCH  insulin glargine, 10 Units, Subcutaneous, QHS  insulin lispro, 1-3 Units, Subcutaneous, QHS  insulin lispro, 1-5 Units,  Subcutaneous, TID AC  letrozole, 2.5 mg, Oral, Daily  lisinopril, 2.5 mg, Oral, Q12H SCH  metFORMIN, 500 mg, Oral, BID Meals  nicotine, 1 patch, Transdermal, Daily  senna-docusate, 2 tablet, Oral, QHS  sodium chloride, 1 g, Oral, BID  tiotropium, 2 puff, Inhalation, QAM  vitamin D (cholecalciferol), 25 mcg, Oral, Daily      Continuous Infusions:   acetaminophen, 1,000 mg, Q6H PRN  albuterol, 2.5 mg, Q6H PRN  alum & mag hydroxide-simethicone, 30 mL, Q4H PRN  bisacodyl, 10 mg, Daily PRN  bisacodyl, 10 mg, Daily PRN  glucagon (rDNA), 1 mg, PRN   And  dextrose, 250 mL, PRN   And  dextrose, 25 g, PRN   And  dextrose, 25 g, PRN  melatonin, 3 mg, QHS PRN  ondansetron, 4 mg, Q8H PRN   Or  ondansetron, 4 mg, Q8H PRN  oxyCODONE, 10 mg, Q4H PRN  oxyCODONE, 5 mg, Q4H PRN  polyethylene glycol, 17 g, Daily PRN  sodium phosphate, 1 enema, Daily PRN        Medication Review  A complete drug regimen review was completed: Yes  Were any drug issues found during review?: No          Review of Systems:   A comprehensive review of systems was: No fevers, chills, nausea, vomiting, chest pain, shortness of breath, cough, headache, double vision.  All others negative.    Physical Exam:     Vitals:    03/26/21 1122 03/26/21 1309 03/26/21 1413 03/26/21 1612   BP: 120/71 127/78 123/75 120/69   Pulse: 90 (!) 101 98 96   Resp:    19   Temp:    97.9  F (36.6 C)   TempSrc:    Oral   SpO2:  94% 93% 95%   Weight:       Height:           Intake and Output Summary (Last 24 hours) at Date Time    Intake/Output Summary (Last 24 hours) at 03/26/2021 1719  Last data filed at 03/26/2021 1610  Gross per 24 hour   Intake 240 ml   Output --   Net 240 ml       P.O.: 240 mL (03/26/21 0702)     Urine: 0 mL (03/25/21 0600)  Bladder Scan Volume (mL): 108 mL (03/19/21 0556)        No acute distress.    Chest rhythmic movement of chest with normal respirations.  Abdomen nondistended  Cardiac regular rhythm  Lungs some expiratory wheezing.  In no acute  distress.    Labs:   No results for input(s): GLUCOSEWHOLE in the last 24 hours.    Recent Labs   Lab 03/26/21  0613 03/22/21  0627   WBC 6.54 4.76   Hgb 13.1 12.5   Hematocrit 39.1 36.8   Platelets 312 266          Recent Labs   Lab 03/26/21  0613 03/22/21  0627 03/20/21  0607   Sodium 134* 132* 129*   Potassium 4.4 4.2 4.6   Chloride 100 95* 94*   CO2 26 27 26    BUN 6.0* 8.0 8.0   Creatinine 0.5* 0.5* 0.5*   Calcium 8.9 8.7 8.8   Albumin 3.3* 3.2* 3.2*   Protein, Total 5.8* 5.6* 5.4*   Bilirubin, Total 0.6 0.4 0.4   Alkaline Phosphatase 138* 175* 237*   ALT 15 48 106*   AST (SGOT) 10 18 65*   Glucose 117* 104* 101*               Results       Procedure Component Value Units Date/Time    Glucose Whole Blood - POCT [[id number]]  (Abnormal) Collected: 03/26/21 1608     Updated: 03/26/21 1629     Whole Blood Glucose POCT 155 mg/dL     Glucose Whole Blood - POCT [[id number]]  (Abnormal) Collected: 03/26/21 1118     Updated: 03/26/21 1121     Whole Blood Glucose POCT 155 mg/dL     GFR [[id number]] Collected: 03/26/21 0613     Updated: 03/26/21 0739     EGFR >60.0       Comprehensive metabolic panel [[id number]]  (Abnormal) Collected: 03/26/21 0613    Specimen: Blood Updated: 03/26/21 0739     Glucose 117 mg/dL      BUN 6.0 mg/dL      Creatinine 0.5 mg/dL      Sodium 284 mEq/L      Potassium 4.4 mEq/L      Chloride 100 mEq/L      CO2 26 mEq/L      Calcium 8.9 mg/dL      Protein, Total 5.8 g/dL      Albumin 3.3 g/dL      AST (SGOT) 10 U/L      ALT 15 U/L      Alkaline Phosphatase 138 U/L      Bilirubin, Total 0.6 mg/dL      Globulin 2.5 g/dL      Albumin/Globulin Ratio 1.3     Anion Gap 8.0    CBC without differential [[id number]] Collected: 03/26/21 7253    Specimen: Blood  Updated: 03/26/21 0657     WBC 6.54 x10 3/uL      Hgb 13.1 g/dL      Hematocrit 98.1 %      Platelets 312 x10 3/uL      RBC 4.17 x10 6/uL      MCV 93.8 fL      MCH 31.4 pg      MCHC 33.5 g/dL      RDW 13 %      MPV 10.5 fL      Nucleated RBC 0.0 /100 WBC       Absolute NRBC 0.00 x10 3/uL     Glucose Whole Blood - POCT [[id number]]  (Abnormal) Collected: 03/26/21 0557     Updated: 03/26/21 0602     Whole Blood Glucose POCT 135 mg/dL     Glucose Whole Blood - POCT [[id number]]  (Abnormal) Collected: 03/25/21 2043     Updated: 03/25/21 2046     Whole Blood Glucose POCT 147 mg/dL                  Rads:   Radiological Procedure reviewed.  Radiology Results (24 Hour)       ** No results found for the last 24 hours. **                Assessment and Plan:     Ambulation and activities of daily living dysfunction    L1 and T11 compression fractures  -- Continue with PT and OT.  Follow endurance.  Follow gains.  -- Estimated length of stay to go home if medically stable is June 30  6/27: Plan for d/c on 6/30 and pt will go home to [place].    Transaminitis: Resolved  -- 6/20 patient is taking Tylenol 1000 mg perhaps once a day as needed for pain.  We will discontinue Crestor 40 mg p.o. nightly.  Follow daily CMP's for now.  -- 6/21 improving trend  --6/23 Resolved.  Consider restarting a lower dose of Crestor.  6/27: will start Crestor 10mg  and f/u LFT in 2 days.    Hypertension  -- Continue regimen.  Continue to monitor.  Some lability noted.  -- 6/21 low blood pressures noted.  Currently on lisinopril 10 mg daily.  Will change lisinopril to 2.5 mg twice daily.  -- 6/22 continue current lisinopril 2.5 mg twice daily.  Blood pressures are improved  6/27: BP 120/69 today.    Hypercholesterolemia  -- 6/20 hold Crestor 40 mg nightly because of elevated LFTs.  Continue to follow LFTs.  6/27: LFT normal. Start low dose of crestor.    Diabetes mellitus type II with hyperglycemia  --6/20: Continue to monitor.    -- Controlled blood glucose levels.  Consult hospitalist medicine.  6/27: BG stable, medicine f/u.    Hyponatremia  --6/20 possibly due to oral intake.  Continue to monitor.  Encourage oral intake.  Follow daily CMP for now.  --6/21 sodium level stable 129.  Patient reports that as  an outpatient she was on fluid restriction which she observing now.  --6/22 consult nephrology.  -- 6/23 appreciate nephrology efforts.  Improved  6/27: Na+ 134 today.    COPD  -- No complaints of shortness of breath.  Follow trend  --6/24 patient noticed some wheezing.  Continue with regimen.  Will order nebulizer as needed.  Consult hospitalist medicine.    Allergic rhinitis with rhinorrhea  -- 6/23 trial of Zyrtec and Flonase.  -- 6/24 patient reports improvement  overall.    #GI  6/27: Give a dose of supp tonite.    History of lung cancer    History of breast cancer    History of nicotine dependence     DVT prophylaxis: Ambulation and heparin 5000 units subcu every 8 hours  --6/19: Negative bilateral lower limb venous duplex for DVT    Patient Active Problem List   Diagnosis    Lumbar compression fracture, closed, initial encounter       Continue comprehensive and intensive inpatient rehab program, including:   Physical therapy 60-120 min daily, 5-6 times per week, Occupational therapy 60-120 min daily, 5-6 times per week, Case management and Rehabilitation nursing    Signed by: Leretha Dykes, MD    [facility]    If there are questions or concerns about the content of this note or information contained within the body of this dictation they should be addressed directly with the author for clarification.

## 2021-03-26 NOTE — Plan of Care (Signed)
Problem: Bladder Management  Goal: LTG: Patient will be able to void spontaneously with PVR of less than 100 cc.   Outcome: Progressing     Problem: Endocrine  Goal: LTG: Prior D/C patient will demonstrate the ability to self manage diabetes at home and what to do when symptoms arise.  Outcome: Progressing     Problem: Pain Management  Goal: LTG: Pain will verbalized adequate pain control with scheduled/ current pain medication prior to D/C.  Outcome: Progressing     Problem: Safety Risk  Goal: LTG: Patient will call for assistance for out of bed activities and remain free from falls through hospitalization.  Outcome: Progressing

## 2021-03-26 NOTE — Progress Notes (Signed)
MEDICINE CONSULT FOLLOW-UP    Date Time: 03/26/21 11:17 AM  Patient Name: [facility]  Attending Physician: Virl Cagey, MD  Consulting Physician: Laretta Bolster, MD, MD      Assessment:     Active Hospital Problems    Diagnosis    Lumbar compression fracture, closed, initial encounter   COPD exacerbation  Hypertension  Diabetes mellitus type 2  Hyperlipidemia    70 y.o. female who was admitted to acute rehab on 6/17, has a history of hypertension, diabetes mellitus type 2 with hyperglycemia, hyperlipidemia, COPD, lung cancer, breast cancer nicotine dependence had presented to [facility] emergency room with back pain was found to have abnormal edema within the enhancement including T10, T11, L1 and L2 vertebral bodies.  There was superior endplate fracture involving T11 and L1 vertebral body with vertebral body height loss.  She had COPD exacerbation.    Recommendations:     Mild COPD exacerbation/wheezing/likely exacerbated by allergies  -- Patient feels much better  -- No wheezing noted  --Patient lives in the [place] area and feels she may be having allergies  -- Continue Zyrtec  --Continue Spiriva  -- Continue Flonase  -- Systemic steroids not indicated  --As needed nebulizers     Recent fracture T11 and L1 vertebral bodies  --Pain control  --PT/OT     History of hypertension  --Blood pressures well controlled  --Continue lisinopril 2.5 mg every 12 hours     History of diabetes mellitus type 2  --Lantus 10 units at bedtime  --On metformin 500 mg twice a day  --Sliding scale insulin coverage  --Accu-Cheks before every meal and at bedtime  -- Patient's Accu-Cheks were reviewed, well controlled in the 120s to 130s.     Hyponatremia  --Continue sodium chloride tablets 1 g twice daily  -- Sodium has improved to 134 from 132    Reviewed labs from this morning.    Discussed with patient, staff      Subjective:     CC: follow-up: COPD exacerbation, lumbar compression fracture    HPI/Subjective: Patient denies any  wheezing.  She feels much better.    No fever or chills.  Cough present    Review of Systems:   Review of Systems - negative except as above in HPI    Physical Exam:   Temp:  [97.9 F (36.6 C)-98.1 F (36.7 C)] 97.9 F (36.6 C)  Heart Rate:  [77-81] 77  Resp Rate:  [18] 18  BP: (132-158)/(61-94) 143/61  Body mass index is 21.72 kg/m.    Intake/Output Summary (Last 24 hours) at 03/26/2021 1117  Last data filed at 03/26/2021 0981  Gross per 24 hour   Intake 240 ml   Output --   Net 240 ml       Weight Monitoring 03/16/2021 03/18/2021 03/25/2021   Height 165.1 cm - -   Weight 60.7 kg 60 kg 59.2 kg   Weight Method - Bed Scale -   BMI (calculated) 22.3 kg/m2 - -         General: awake, in no acute distress.  Cardiovascular: regular rate and rhythm, no murmurs, rubs or gallops  Lungs: clear to auscultation bilaterally, without wheezing, rhonchi, or rales  Abdomen: soft, non-tender, non-distended; no palpable masses, no hepatosplenomegaly, normoactive bowel sounds, no rebound or guarding  Extremities: no clubbing, cyanosis, or edema  Neuro- alert, oriented x 3      Meds:   Medications were reviewed:  Scheduled Meds:  Current Facility-Administered Medications  Medication Dose Route Frequency    calcium citrate-vitamin D  1 tablet Oral Daily with lunch    cetirizine  10 mg Oral Daily    famotidine  20 mg Oral Daily with dinner    fluticasone  1 spray Each Nare BID    heparin (porcine)  5,000 Units Subcutaneous Q8H SCH    insulin glargine  10 Units Subcutaneous QHS    insulin lispro  1-3 Units Subcutaneous QHS    insulin lispro  1-5 Units Subcutaneous TID AC    letrozole  2.5 mg Oral Daily    lisinopril  2.5 mg Oral Q12H SCH    metFORMIN  500 mg Oral BID Meals    nicotine  1 patch Transdermal Daily    senna-docusate  2 tablet Oral QHS    sodium chloride  1 g Oral BID    tiotropium  2 puff Inhalation QAM    vitamin D (cholecalciferol)  25 mcg Oral Daily     Continuous Infusions:  PRN Meds:.acetaminophen, albuterol, alum & mag  hydroxide-simethicone, bisacodyl, bisacodyl, Nursing communication: Adult Hypoglycemia Treatment Algorithm **AND** glucagon (rDNA) **AND** dextrose **AND** dextrose **AND** dextrose, melatonin, ondansetron **OR** ondansetron, oxyCODONE, oxyCODONE, polyethylene glycol, sodium phosphate    Labs:     Recent Labs     03/26/21  0557 03/25/21  2043 03/25/21  1647 03/25/21  1121   Whole Blood Glucose POCT 135* 147* 155* 170*       Recent Labs   Lab 03/26/21  0613 03/22/21  0627 03/20/21  0607   Sodium 134* 132* 129*   Potassium 4.4 4.2 4.6   Chloride 100 95* 94*   CO2 26 27 26    BUN 6.0* 8.0 8.0   Creatinine 0.5* 0.5* 0.5*   EGFR >60.0 >60.0 >60.0   Glucose 117* 104* 101*   Calcium 8.9 8.7 8.8           Recent Labs   Lab 03/26/21  0613 03/22/21  0627   WBC 6.54 4.76   Hgb 13.1 12.5   Hematocrit 39.1 36.8   Platelets 312 266       Recent Labs   Lab 03/26/21  0613 03/22/21  0627   ALT 15 48   AST (SGOT) 10 18   Bilirubin, Total 0.6 0.4   Alkaline Phosphatase 138* 175*                   Imaging personally reviewed, including: all available  No results found.      This note was generated by the Epic EMR system/ Dragon speech recognition and may contain inherent errors or omissions not intended by the user. Grammatical errors, random word insertions, deletions and pronoun errors  are occasional consequences of this technology due to software limitations. Not all errors are caught or corrected. If there are questions or concerns about the content of this note or information contained within the body of this dictation they should be addressed directly with the author for clarification.    Signed by: Laretta Bolster, MD

## 2021-03-27 DIAGNOSIS — S32000A Wedge compression fracture of unspecified lumbar vertebra, initial encounter for closed fracture: Secondary | ICD-10-CM

## 2021-03-27 LAB — GLUCOSE WHOLE BLOOD - POCT
Whole Blood Glucose POCT: 133 mg/dL — ABNORMAL HIGH (ref 70–100)
Whole Blood Glucose POCT: 172 mg/dL — ABNORMAL HIGH (ref 70–100)
Whole Blood Glucose POCT: 135 mg/dL — ABNORMAL HIGH (ref 70–100)
Whole Blood Glucose POCT: 143 mg/dL — ABNORMAL HIGH (ref 70–100)

## 2021-03-27 MED ORDER — DEXTROMETHORPHAN POLISTIREX ER 30 MG/5ML PO SUER
30.0000 mg | Freq: Two times a day (BID) | ORAL | Status: DC
Start: 2021-03-27 — End: 2021-03-29
  Administered 2021-03-27 – 2021-03-29 (×4): 30 mg via ORAL
  Filled 2021-03-27 (×6): qty 5

## 2021-03-27 MED ORDER — POLYETHYLENE GLYCOL 3350 17 G PO PACK
17.0000 g | PACK | Freq: Every day | ORAL | Status: DC
Start: 2021-03-27 — End: 2021-03-29
  Administered 2021-03-27: 12:00:00 17 g via ORAL
  Filled 2021-03-27 (×2): qty 1

## 2021-03-27 NOTE — Plan of Care (Signed)
Problem: Bladder Management  Goal: LTG: Patient will be able to void spontaneously with PVR of less than 100 cc.   Outcome: Progressing     Problem: Endocrine  Goal: LTG: Prior D/C patient will demonstrate the ability to self manage diabetes at home and what to do when symptoms arise.  Outcome: Progressing     Problem: Pain Management  Goal: LTG: Pain will verbalized adequate pain control with scheduled/ current pain medication prior to D/C.  Outcome: Progressing     Problem: Safety Risk  Goal: LTG: Patient will call for assistance for out of bed activities and remain free from falls through hospitalization.  Outcome: Progressing

## 2021-03-27 NOTE — PT Progress Note (Signed)
Physical Therapy  Inpatient Rehabilitation Daily Progress Note    Patient Name:  [name] [name]       Medical Record Number: [id number]   Date of Birth: 05-30-1951  Sex: Female          Room/Bed:  M502/M502-01    Rehabilitation Precautions/Restrictions:  Weight Bearing Status: no restrictions  Back Brace Applied: OOB (TLSO)  Spinal Precautions: no bending, no twisting, no lifting  Other Precautions: Falls    Rehab Diagnosis: Lumbar compression fracture, closed, initial encounter [S32.000A]     Subjective   Patient Report: Pt agreeable to therapy.  Patient/Caregiver Goals: Be in less pain and be able to move.  Pain Assessment:  Pain Assessment: No/denies pain    Objective   Vitals:  Heart Rate: 99  BP: 111/70  MAP (mmHg): 84    Interventions: Patient up in wheelchair when PT arrived. PT taught patient wheelchair leg management. PT then evaluating patient for upgrade to blue in room. Patient performed toilet transfer, bed transfer, wheelchair management, ambulation with rolling walker throughout the room safely. Pt then practiced 6" curb negotiation with distant supervision x 4 with RW. Gait during session: to /from 5c gym from room with casual supervision. 200' with RW indepedently. Pt also toileted independently.     Evaluation:                  Assessments:      Education Documentation  No documentation found.  Education Comments  No comments found.         Assessment & Plan   Pt's mobility independence and tolerance continue to improve. Continue POC.     Plan:  Risks/Benefits/POC Discussed with Pt/Family: With patient  Treatment/Interventions: Exercise, Gait training, Stair training, Neuromuscular re-education, Functional transfer training, LE strengthening/ROM, Endurance training, Patient/family training, Equipment eval/education    Recommendation:  Discharge Recommendation: Home with home health PT  PT Therapy Recommendation: 5-6 days/week, 60-120 mins/day, 1:1 treatment  PT Estimated Length of Stay: 10 days from  IE on 03/17/21

## 2021-03-27 NOTE — OT Progress Note (Signed)
Occupational Therapy  Inpatient Rehabilitation Daily Progress Note    Patient Name:  [name] [name]       Medical Record Number: [id number]   Date of Birth: 12-Sep-1951  Sex: Female          Room/Bed:  M502/M502-01    Rehabilitation Precautions/Restrictions:  Weight Bearing Status: no restrictions  Back Brace Applied: OOB (TLSO)  Spinal Precautions: no bending, no twisting, no lifting  Other Precautions: Falls    Rehab Diagnosis: Lumbar compression fracture, closed, initial encounter [S32.000A]     Subjective   Patient Report: "It is so nice out today." -Pt in reference to the weather outside  Patient/Caregiver Goals: Be in less pain and be able to move.  Pain Assessment:  Pain Assessment: No/denies pain    Objective   Vitals:    Pre:  BP: 109/71 P 101    Post:  Heart Rate: (!) 104  BP: 107/63  MAP (mmHg): 78    Interventions:   RN approved pt for therapy and pt seated EOB upon OT arrival (+) TLSO.   Pt ambulated in/out of the bathroom with the RW, completed a toilet t/f, and toileting (+) urination with casual SPV for safety and min cues for spinal precautions.   The remainder of the session focused on therapeutic engagement within a preferred leisure task of gardening to promote pt's adherence to spinal precautions, functional activity tolerance, and balance for increased pt safety and independence with ADLs/IADLs upon d/c. Pt ambulated with the RW and engaged in standing gardening tasks, including planting seeds, watering plants, and making a bouquet, requiring reaching outside of BOS and with and without UE support. Pt remained standing approximately 25 minutes with one seated rest break. Provided SPV for safety and min verbal cues for self-monitoring and spinal precaution adherence throughout.     Education Documentation  Precautions, taught by Raliegh Ip, OT at 03/27/2021 11:00 AM.  Learner: Patient  Readiness: Acceptance  Method: Explanation, Demonstration  Response: Verbalizes Understanding, Demonstrated  Understanding, Needs Reinforcement    Home management activities, taught by Raliegh Ip, OT at 03/27/2021 11:00 AM.  Learner: Patient  Readiness: Acceptance  Method: Explanation, Demonstration  Response: [name] Understanding, Demonstrated Understanding, Needs Reinforcement    Functional transfers/mobility, taught by Raliegh Ip, OT at 03/27/2021 11:00 AM.  Learner: Patient  Readiness: Acceptance  Method: Explanation, Demonstration  Response: Verbalizes Understanding, Demonstrated Understanding, Needs Reinforcement    Fall prevention/balance training, taught by Raliegh Ip, OT at 03/27/2021 11:00 AM.  Learner: Patient  Readiness: Acceptance  Method: Explanation, Demonstration  Response: Verbalizes Understanding, Demonstrated Understanding, Needs Reinforcement    Education Comments  No comments found.      Assessment & Plan   Pt was highly motivated and engaged within the gardening task completed today. Pt is demonstrating improvements in activity tolerance; however, will benefit from continued emphasis within this area to promote increased pt safety and independence with IADLs upon d/c. Continue OT POC.     Plan:  Risks/Benefits/POC Discussed with Pt/Family: With patient  Treatment Interventions: ADL retraining, Functional transfer training, UE strengthening/ROM, Endurance training, Patient/Family training, Equipment eval/education, Compensatory technique education    Recommendation:  Discharge Recommendation: Home with supervision, Home with home health OT  OT Therapy Recommendation: 5-6 days/week, 60-120 mins/day  OT Estimated Length of Stay: 10-14 days from IE (03/17/21)

## 2021-03-27 NOTE — Plan of Care (Signed)
Problem: Bladder Management  Goal: LTG: Patient will be able to void spontaneously with PVR of less than 100 cc.   Outcome: Adequate for Discharge    Pt is continent.     Problem: Endocrine  Goal: LTG: Prior D/C patient will demonstrate the ability to self manage diabetes at home and what to do when symptoms arise.  Outcome: Progressing    On scheduled oral medications as well as scheduled and prn insulin. Blood sugars have been running 100-180.     Problem: Pain Management  Goal: LTG: Pain will verbalized adequate pain control with scheduled/ current pain medication prior to D/C.  Outcome: Progressing    Reports of generalized discomfort continue and pt receives prn tylenol or Oxy IR to manage.     Problem: Safety Risk  Goal: LTG: Patient will call for assistance for out of bed activities and remain free from falls through hospitalization.  Outcome: Progressing    Pt has progressed to the Blue Level for falls risk and uses call bell appropriately to seek staff assistance with care and mobility needs     Problem: Compromised Tissue integrity  Goal: Damaged tissue is healing and protected  Outcome: Adequate for Discharge    No surgical intervention. Skin is intact.

## 2021-03-27 NOTE — OT Progress Note (Signed)
Occupational Therapy  Inpatient Rehabilitation Discharge Summary    Patient Name:  [name] [name]       Medical Record Number: [id number]   Date of Birth: [birth year]  Sex: Female          Room/Bed:  M502/M502-01    Rehabilitation Precautions/Restrictions:  Weight Bearing Status: no restrictions  Back Brace Applied: OOB (TLSO)  Spinal Precautions: no bending, no twisting, no lifting  Other Precautions: Falls    Rehab Diagnosis: Lumbar compression fracture, closed, initial encounter [S32.000A]     History of Present Illness:   [name] [name] is a 70 year-old female with past medical history significant for diabetes mellitus with hyperglycemia, hypertension hypercholesterolemia, COPD, lung cancer, breast cancer, nicotine dependence who presents to the [facility] ED with c/o right-sided lower back pain that has been ongoing since April. Patient mentioned that she incurred an injury to her back when she tries to pick up a heavy suitcase sometime in April. She was diagnosed with Lumbar sprain by then. Patient came to the ED today because she was having severe pain that she could not tolerate. She mentioned that she has been taking Tylenol and Motrin and a muscle relaxant that was prescribed by her PCP but has stopped taking the muscle relaxant because she is supposed to take it food and has been eating much. Per attending MRI of the lumbar spine showed degenerative changes. There are multiple vertebral bodies with abnormal edema within enhancement including T10, T11, L1, and L2 vertebral bodies. There is evidence for a superior endplate fracture involving L1 vertebral body with approximately 15% vertebral body height loss. There is also a superior endplate fracture involving T11. No large associated soft tissue mass or abnormal enhancement is identified in the remaining vertebral bodies. The findings could represent multiple acute osteopenic compression fractures. Although less likely pathologic fractures cannot be  excluded, especially involving the L1 vertebral body.  On admission to the hospital she was had hyponatremia which has since resolved. She is from [place] and is in [place] visiting her daughter. She lives in a 1 level home with 1 step to enter and no stairs inside. Prior to her recent decline she was independent for all ADLs and mobility    Subjective   Patient Report: "It's hard to hold myself up without my arms while showering"  Patient/Caregiver Goals: Be in less pain and be able to move.  Pain Assessment:  Pain Assessment: Numeric Scale (0-10)  Pain Score: 4-moderate pain  Pain Intervention(s): Medication (See eMAR)    Objective   Interventions:   Pt received seated upright in bed and agreeable to OT tx. Today's session with focus on functional assessment of independence with ADLs in preparation for upcoming discharge. See CARE tool for details. No significant VCs required for safety, however initial instruction provided on modifying hospital environment to simulate home.     Self Care Functional Status:   Current Status Current Status Discharge Goal   Functional Area: Care Score: Comments:    Eating 6    Independent   Oral Hygiene 6    Independent   Toileting Hygiene 6 FWW   Independent   Shower/Bathe Self 6 FWW and shower seat   Independent   Upper Body Dressing 6 Independent with TLSO with overhead donning method   Independent   Lower Body Dressing 6    Independent   Putting On/Taking Off Footwear 6    Independent     Mobility Functional Status:   Current  Status Current Status Discharge Goal   Functional Area: Care Score: Comments:    Toilet Transfer 6 FWW Independent   Picking Up Object 6 FWW and reacher   Independent     Cognition: WNL    Encounter Problems       Encounter Problems (Resolved)       OT General       STG: Pt will adhere to spinal precautions independently without VCs (Completed)       Start: 03/17/21   Expected End: 03/24/21   Met: 03/22/21         STG: Pt will don/doff brace with  supervision and 0 VCs for technique (Completed)       Start: 03/17/21   Expected End: 03/24/21   Met: 03/27/21      Goal note on 03/22/21 0946 by Alfonse Alpers, OT       Continues to require min A for shoulder strap              STG: Pt will don LB clothing with AE PRN and SBA (Completed)       Start: 03/17/21   Expected End: 03/24/21   Met: 03/22/21         LTG: Pt will complete basic ADL routine with mod I and AE/DME PRN (Completed)       Start: 03/17/21   Met: 03/27/21         LTG: Pt will complete household transfers with mod I and AD/DME PRN (Completed)       Start: 03/17/21   Met: 03/27/21         LTG: Pt will complete simple IADL of choice with mod I in order to promote safe return home with limited caregiver assist (Completed)       Start: 03/17/21   Met: 03/27/21         LTG: Pt will verbalize and independently initiate pain management strategies to promote increased activity tolerance  (Completed)       Start: 03/17/21   Met: 03/27/21                Education Documentation  Muscle weakness, taught by Alfonse Alpers, OT at 03/27/2021  1:49 PM.  Learner: Patient  Readiness: Acceptance  Method: Explanation, Demonstration, Teach Back  Response: Verbalizes Understanding, Demonstrated Understanding  Comment: Pt benefits from additional day of OT tx to ensure full carry over and address remaining concerns for air travel home    Emotional adjustments/depression, taught by Alfonse Alpers, OT at 03/27/2021  1:49 PM.  Learner: Patient  Readiness: Acceptance  Method: Explanation, Demonstration, Teach Back  Response: Verbalizes Understanding, Demonstrated Understanding  Comment: Pt benefits from additional day of OT tx to ensure full carry over and address remaining concerns for air travel home    Reconditioning, taught by Alfonse Alpers, OT at 03/27/2021  1:49 PM.  Learner: Patient  Readiness: Acceptance  Method: Explanation, Demonstration, Teach Back  Response: Verbalizes Understanding, Demonstrated  Understanding  Comment: Pt benefits from additional day of OT tx to ensure full carry over and address remaining concerns for air travel home    Verbalize detrimental effects of bedrest/inactivity, taught by Alfonse Alpers, OT at 03/27/2021  1:49 PM.  Learner: Patient  Readiness: Acceptance  Method: Explanation, Demonstration, Teach Back  Response: Verbalizes Understanding, Demonstrated Understanding  Comment: Pt benefits from additional day of OT tx to ensure full carry over and address remaining concerns for air travel home    Signs/symptoms associated with debility, taught  by Alfonse Alpers, OT at 03/27/2021  1:49 PM.  Learner: Patient  Readiness: Acceptance  Method: Explanation, Demonstration, Teach Back  Response: Verbalizes Understanding, Demonstrated Understanding  Comment: Pt benefits from additional day of OT tx to ensure full carry over and address remaining concerns for air travel home    Precautions, taught by Alfonse Alpers, OT at 03/27/2021  1:49 PM.  Learner: Patient  Readiness: Acceptance  Method: Explanation, Demonstration, Teach Back  Response: Verbalizes Understanding, Demonstrated Understanding  Comment: Pt benefits from additional day of OT tx to ensure full carry over and address remaining concerns for air travel home    Pain management, taught by Alfonse Alpers, OT at 03/27/2021  1:49 PM.  Learner: Patient  Readiness: Acceptance  Method: Explanation, Demonstration, Teach Back  Response: Verbalizes Understanding, Demonstrated Understanding  Comment: Pt benefits from additional day of OT tx to ensure full carry over and address remaining concerns for air travel home    Home management activities, taught by Alfonse Alpers, OT at 03/27/2021  1:49 PM.  Learner: Patient  Readiness: Acceptance  Method: Explanation, Demonstration, Teach Back  Response: Verbalizes Understanding, Demonstrated Understanding  Comment: Pt benefits from additional day of OT tx to ensure full carry over and address  remaining concerns for air travel home    Home exercise program, taught by Alfonse Alpers, OT at 03/27/2021  1:49 PM.  Learner: Patient  Readiness: Acceptance  Method: Explanation, Demonstration, Teach Back  Response: Verbalizes Understanding, Demonstrated Understanding  Comment: Pt benefits from additional day of OT tx to ensure full carry over and address remaining concerns for air travel home    Functional transfers/mobility, taught by Alfonse Alpers, OT at 03/27/2021  1:49 PM.  Learner: Patient  Readiness: Acceptance  Method: Explanation, Demonstration, Teach Back  Response: Verbalizes Understanding, Demonstrated Understanding  Comment: Pt benefits from additional day of OT tx to ensure full carry over and address remaining concerns for air travel home    Equipment, taught by Alfonse Alpers, OT at 03/27/2021  1:49 PM.  Learner: Patient  Readiness: Acceptance  Method: Explanation, Demonstration, Teach Back  Response: Verbalizes Understanding, Demonstrated Understanding  Comment: Pt benefits from additional day of OT tx to ensure full carry over and address remaining concerns for air travel home    Driving, taught by Alfonse Alpers, OT at 03/27/2021  1:49 PM.  Learner: Patient  Readiness: Acceptance  Method: Explanation, Demonstration, Teach Back  Response: Verbalizes Understanding, Demonstrated Understanding  Comment: Pt benefits from additional day of OT tx to ensure full carry over and address remaining concerns for air travel home    ADL retraining, taught by Alfonse Alpers, OT at 03/27/2021  1:49 PM.  Learner: Patient  Readiness: Acceptance  Method: Explanation, Demonstration, Teach Back  Response: Verbalizes Understanding, Demonstrated Understanding  Comment: Pt benefits from additional day of OT tx to ensure full carry over and address remaining concerns for air travel home    Education Comments  No comments found.        Assessment & Plan   Summary of Progress and Current Status:   Pt made good  progress over course of OT tx. Pt initially required physical assistance with most aspects of ADL tasks 2/2 pain and difficulty adhering to spinal precautions. Pt with limited tolerance for mobility and extended time required for ambulation. However, pt engaged in OT tx well overall and has achieved mod I status with all ADLs and household transfers with use of FWW and shower seat. Pt has ordered all necessary  equipment per pt report and has friend offering assistance upon initial discharge home. Pt will benefit from home health OT upon discharge to assess independence in home environment.     Plan:  Risks/Benefits/POC Discussed with Pt/Family: With patient  Treatment Interventions: ADL retraining, Functional transfer training, UE strengthening/ROM, Endurance training, Patient/Family training, Equipment eval/education, Compensatory technique education    Recommendation:  Discharge Recommendation: Home with home health OT  DME Recommended for Discharge: Shower chair, Other (Comment) (Toilet rails)  OT Therapy Recommendation: 5-6 days/week, 60-120 mins/day  OT Estimated Length of Stay: 10-14 days from IE (03/17/21)

## 2021-03-27 NOTE — Progress Notes (Signed)
PHYSICAL MEDICINE AND REHABILITATION  PROGRESS NOTE -- FACE-TO-FACE ENCOUNTER    Date Time: 03/27/21 2:02 PM  Patient Name: [name],[name]    Admission date:  03/16/2021    Subjective:     Pt with no nausea today and had BM with supp yesterday.  Pt drank miralax .  Pt with back pain, wearing TLSO brace.  Will try supp once if no BM today.  Pt with no abd pain.    Functional Status:     Pt seated EOB upon OT arrival (+) TLSO.  Pt ambulated in/out of the bathroom with the RW, completed a toilet t/f, and toileting (+) urination with casual SPV for safety and min cues for spinal precautions.   The remainder of the session focused on therapeutic engagement within a preferred leisure task of gardening to promote pt's adherence to spinal precautions, functional activity tolerance, and balance for increased pt safety and independence with ADLs/IADLs upon d/c. Pt ambulated with the RW and engaged in standing gardening tasks, including planting seeds, watering plants, and making a bouquet, requiring reaching outside of BOS and with and without UE support. Pt remained standing approximately 25 minutes with one seated rest break. Provided SPV for safety and min verbal cues for self-monitoring and spinal precaution adherence throughout.    Medications:   Medication reviewed by me:     Scheduled Meds: PRN Meds:   calcium citrate-vitamin D, 1 tablet, Oral, Daily with lunch  cetirizine, 10 mg, Oral, Daily  famotidine, 20 mg, Oral, Daily with dinner  fluticasone, 1 spray, Each Nare, BID  heparin (porcine), 5,000 Units, Subcutaneous, Q8H SCH  insulin glargine, 10 Units, Subcutaneous, QHS  insulin lispro, 1-3 Units, Subcutaneous, QHS  insulin lispro, 1-5 Units, Subcutaneous, TID AC  letrozole, 2.5 mg, Oral, Daily  lisinopril, 2.5 mg, Oral, Q12H SCH  metFORMIN, 500 mg, Oral, BID Meals  nicotine, 1 patch, Transdermal, Daily  polyethylene glycol, 17 g, Oral, Daily  rosuvastatin, 10 mg, Oral, QHS  senna-docusate, 2 tablet, Oral,  QHS  sodium chloride, 1 g, Oral, BID  tiotropium, 2 puff, Inhalation, QAM  vitamin D (cholecalciferol), 25 mcg, Oral, Daily      Continuous Infusions:   acetaminophen, 1,000 mg, Q6H PRN  albuterol, 2.5 mg, Q6H PRN  alum & mag hydroxide-simethicone, 30 mL, Q4H PRN  bisacodyl, 10 mg, Daily PRN  bisacodyl, 10 mg, Daily PRN  glucagon (rDNA), 1 mg, PRN   And  dextrose, 250 mL, PRN   And  dextrose, 25 g, PRN   And  dextrose, 25 g, PRN  melatonin, 3 mg, QHS PRN  ondansetron, 4 mg, Q8H PRN   Or  ondansetron, 4 mg, Q8H PRN  oxyCODONE, 10 mg, Q4H PRN  oxyCODONE, 5 mg, Q4H PRN  polyethylene glycol, 17 g, Daily PRN  sodium phosphate, 1 enema, Daily PRN        Medication Review  A complete drug regimen review was completed: Yes  Were any drug issues found during review?: No          Review of Systems:   A comprehensive review of systems was: No fevers, chills, nausea, vomiting, chest pain, shortness of breath, cough, headache, double vision.  All others negative.    Physical Exam:     Vitals:    03/27/21 1026 03/27/21 1107 03/27/21 1114 03/27/21 1308   BP: 145/72 109/71 107/63 111/70   Pulse: (!) 106 (!) 101 (!) 104 99   Resp:       Temp:  TempSrc:       SpO2:       Weight:       Height:           Intake and Output Summary (Last 24 hours) at Date Time    Intake/Output Summary (Last 24 hours) at 03/27/2021 1402  Last data filed at 03/27/2021 1145  Gross per 24 hour   Intake 440 ml   Output 1 ml   Net 439 ml       P.O.: 200 mL (03/27/21 0800)     Urine: 1 mL (03/27/21 1145)  Bladder Scan Volume (mL): 108 mL (03/19/21 0556)        No acute distress.    Chest rhythmic movement of chest with normal respirations.  Abdomen nondistended  Cardiac regular rhythm  Lungs some expiratory wheezing.  In no acute distress.    Labs:   No results for input(s): GLUCOSEWHOLE in the last 24 hours.    Recent Labs   Lab 03/26/21  0613 03/22/21  0627   WBC 6.54 4.76   Hgb 13.1 12.5   Hematocrit 39.1 36.8   Platelets 312 266          Recent Labs    Lab 03/26/21  0613 03/22/21  0627   Sodium 134* 132*   Potassium 4.4 4.2   Chloride 100 95*   CO2 26 27   BUN 6.0* 8.0   Creatinine 0.5* 0.5*   Calcium 8.9 8.7   Albumin 3.3* 3.2*   Protein, Total 5.8* 5.6*   Bilirubin, Total 0.6 0.4   Alkaline Phosphatase 138* 175*   ALT 15 48   AST (SGOT) 10 18   Glucose 117* 104*               Results       Procedure Component Value Units Date/Time    Glucose Whole Blood - POCT [[id number]]  (Abnormal) Collected: 03/27/21 1111     Updated: 03/27/21 1113     Whole Blood Glucose POCT 172 mg/dL     Glucose Whole Blood - POCT [[id number]]  (Abnormal) Collected: 03/27/21 0627     Updated: 03/27/21 0629     Whole Blood Glucose POCT 133 mg/dL     Glucose Whole Blood - POCT [[id number]]  (Abnormal) Collected: 03/26/21 2054     Updated: 03/26/21 2113     Whole Blood Glucose POCT 180 mg/dL     Glucose Whole Blood - POCT [[id number]]  (Abnormal) Collected: 03/26/21 1608     Updated: 03/26/21 1629     Whole Blood Glucose POCT 155 mg/dL                  Rads:   Radiological Procedure reviewed.  Radiology Results (24 Hour)       ** No results found for the last 24 hours. **                Assessment and Plan:     Ambulation and activities of daily living dysfunction    L1 and T11 compression fractures  -- Continue with PT and OT.  Follow endurance.  Follow gains.  -- Estimated length of stay to go home if medically stable is June 30  6/27: Plan for d/c on 6/30 and pt will go home to [place].    Transaminitis: Resolved  -- 6/20 patient is taking Tylenol 1000 mg perhaps once a day as needed for pain.  We will discontinue Crestor 40 mg p.o. nightly.  Follow daily CMP's for now.  -- 6/21 improving trend  --6/23 Resolved.  Consider restarting a lower dose of Crestor.  6/27: will start Crestor 10mg  and f/u LFT in 2 days.  6/28: LFT in am.    Hypertension  -- Continue regimen.  Continue to monitor.  Some lability noted.  -- 6/21 low blood pressures noted.  Currently on lisinopril 10 mg daily.  Will change  lisinopril to 2.5 mg twice daily.  -- 6/22 continue current lisinopril 2.5 mg twice daily.  Blood pressures are improved  6/27: BP 120/69 today.    Hypercholesterolemia  -- 6/20 hold Crestor 40 mg nightly because of elevated LFTs.  Continue to follow LFTs.  6/27: LFT normal. Start low dose of crestor.    Diabetes mellitus type II with hyperglycemia  --6/20: Continue to monitor.    -- Controlled blood glucose levels.  Consult hospitalist medicine.  6/27: BG stable, medicine f/u.  6/28: BG stable.    Hyponatremia  --6/20 possibly due to oral intake.  Continue to monitor.  Encourage oral intake.  Follow daily CMP for now.  --6/21 sodium level stable 129.  Patient reports that as an outpatient she was on fluid restriction which she observing now.  --6/22 consult nephrology.  -- 6/23 appreciate nephrology efforts.  Improved  6/27: Na+ 134 today.    COPD  -- No complaints of shortness of breath.  Follow trend  --6/24 patient noticed some wheezing.  Continue with regimen.  Will order nebulizer as needed.  Consult hospitalist medicine.    Allergic rhinitis with rhinorrhea  -- 6/23 trial of Zyrtec and Flonase.  -- 6/24 patient reports improvement overall.    #GI  6/27: Give a dose of supp tonite.  6/28: Pt took miralax and if no BM, will try supp tonite. Pt had BM yesterday with supp.    History of lung cancer    History of breast cancer    History of nicotine dependence     DVT prophylaxis: Ambulation and heparin 5000 units subcu every 8 hours  --6/19: Negative bilateral lower limb venous duplex for DVT    Patient Active Problem List   Diagnosis    Lumbar compression fracture, closed, initial encounter       Continue comprehensive and intensive inpatient rehab program, including:   Physical therapy 60-120 min daily, 5-6 times per week, Occupational therapy 60-120 min daily, 5-6 times per week, Case management and Rehabilitation nursing    Signed by: Leretha Dykes, MD    [facility]    If there are questions or concerns about the content of this note or information contained within the body of this dictation they should be addressed directly with the author for clarification.

## 2021-03-27 NOTE — Group Note (Signed)
Rehab - Horticultural  Inpatient Rehabilitation Group Note    Patient Name:  [name] [name]       Medical Record Number: [id number]   Date of Birth: [birth year]  Sex: Female          Rehab Diagnosis: Lumbar compression fracture, closed, initial encounter [S32.000A]     Today's Date: 03/27/2021   Group Start Time: 1100   Group End Time: 1200   Group Facilitators: Roselyn Bering  Group Topic: Rehab - Horticultural  Discipline Led: Therapeutic Recreation  Number of Patient Participants: 5      Treatment Provided  Horticulture Therapy. Please see OT note for details about session.  See Education Activity for details.    Assessment  Participant(s) had good participation  Benefited from psychosocial stimulation in a group setting  Socialized with peer and therapists present

## 2021-03-27 NOTE — Progress Notes (Signed)
Case Management  Inpatient Rehabilitation Progress Note    Call received from patient to discuss discharge plan. Patient confirmed her intent to travel home to [facility] on the day of discharge, Thursday, June 30. Daughter to transport patient to the [employer] for 1p flight. Patient will have curb side assist and assist through security to her gate. Other daughter to assist patient upon arrival to [place].     Patient is amenable to home health services and reported that she previously worked with an agency now called [facility] [phone]). CM agreed to update Post Acute Care Coordinator.     Preferred local pharmacy confirmed and updated in Epic. Attending MD notified via secure chat. Prescriptions can be completed prior to discharge for daughter to retrieve.     Patient requests 9a discharge on Thursday, June 30. CM will update patient care team. CM will continue to follow.     Arlie Solomons, MSW, ACM-SW  Social Worker Case Manager II  [facility] / Acute Rehabilitation  [phone]

## 2021-03-27 NOTE — Progress Notes (Signed)
MEDICINE CONSULT FOLLOW-UP    Date Time: 03/27/21 10:42 AM  Patient Name: [facility]  Attending Physician: Virl Cagey, MD  Consulting Physician: Alfred Levins, MD, MD      Assessment:     Active Hospital Problems    Diagnosis    Lumbar compression fracture, closed, initial encounter   COPD exacerbation  Hypertension  Diabetes mellitus type 2  Hyperlipidemia    70 y.o. female who was admitted to acute rehab on 6/17, has a history of hypertension, diabetes mellitus type 2 with hyperglycemia, hyperlipidemia, COPD, lung cancer, breast cancer nicotine dependence had presented to [facility] emergency room with back pain was found to have abnormal edema within the enhancement including T10, T11, L1 and L2 vertebral bodies.  There was superior endplate fracture involving T11 and L1 vertebral body with vertebral body height loss.  She had COPD exacerbation.    Recommendations:     #Mild COPD exacerbation/wheezing/likely exacerbated by allergies  -- Patient feels much better  -- No wheezing noted  --Patient lives in the [place] area and feels she may be having allergies  -- Continue Zyrtec  --Continue Spiriva  -- Continue Flonase  -- Systemic steroids not indicated  --As needed nebulizers     #Compression fracture T11 and L1 vertebral bodies  --Pain control  --PT/OT     #Hypertension  --Blood pressures well controlled  --Continue lisinopril 2.5 mg every 12 hours     #Diabetes mellitus type 2  --Lantus 10 units at bedtime  --On metformin 500 mg twice a day  --Sliding scale insulin coverage  --Accu-Cheks before every meal and at bedtime  -- Patient's Accu-Cheks were reviewed, well controlled in the 120s to 130s.     #Hyponatremia  --Continue sodium chloride tablets 1 g twice daily  -- Sodium has improved to 134 from 132  -- recheck Sodium in 2 days    #Constipation  - continue pericolace  - add miralax scheduled daily    #Breast Cancer  - continue letrozole    Discussed with patient, staff      Subjective:     CC: follow-up:  COPD exacerbation, lumbar compression fracture    HPI/Subjective:     Patient has lower back pain and felt very fatigued when showering. No acute issues. Denies fevers, chills. Is constipated.    Review of Systems:   Review of Systems - negative except as above in HPI    Physical Exam:   Temp:  [97.7 F (36.5 C)-97.9 F (36.6 C)] 97.7 F (36.5 C)  Heart Rate:  [69-106] 106  Resp Rate:  [18-19] 18  BP: (102-145)/(59-78) 145/72  Body mass index is 21.72 kg/m.    Intake/Output Summary (Last 24 hours) at 03/27/2021 1042  Last data filed at 03/26/2021 1700  Gross per 24 hour   Intake 240 ml   Output --   Net 240 ml     Weight Monitoring 03/16/2021 03/18/2021 03/25/2021   Height 165.1 cm - -   Weight 60.7 kg 60 kg 59.2 kg   Weight Method - Bed Scale -   BMI (calculated) 22.3 kg/m2 - -         General: awake, in no acute distress.  Cardiovascular: regular rate and rhythm, no murmurs, rubs or gallops  Lungs: clear to auscultation bilaterally, without wheezing, rhonchi, or rales  Abdomen: soft, non-tender, non-distended; no palpable masses, no hepatosplenomegaly, normoactive bowel sounds, no rebound or guarding  Extremities: no clubbing, cyanosis, or edema  Neuro- alert, oriented x  3      Meds:   Medications were reviewed:  Scheduled Meds:  Current Facility-Administered Medications   Medication Dose Route Frequency    calcium citrate-vitamin D  1 tablet Oral Daily with lunch    cetirizine  10 mg Oral Daily    famotidine  20 mg Oral Daily with dinner    fluticasone  1 spray Each Nare BID    heparin (porcine)  5,000 Units Subcutaneous Q8H SCH    insulin glargine  10 Units Subcutaneous QHS    insulin lispro  1-3 Units Subcutaneous QHS    insulin lispro  1-5 Units Subcutaneous TID AC    letrozole  2.5 mg Oral Daily    lisinopril  2.5 mg Oral Q12H SCH    metFORMIN  500 mg Oral BID Meals    nicotine  1 patch Transdermal Daily    rosuvastatin  10 mg Oral QHS    senna-docusate  2 tablet Oral QHS    sodium chloride  1 g Oral BID     tiotropium  2 puff Inhalation QAM    vitamin D (cholecalciferol)  25 mcg Oral Daily     Continuous Infusions:  PRN Meds:.acetaminophen, albuterol, alum & mag hydroxide-simethicone, bisacodyl, bisacodyl, Nursing communication: Adult Hypoglycemia Treatment Algorithm **AND** glucagon (rDNA) **AND** dextrose **AND** dextrose **AND** dextrose, melatonin, ondansetron **OR** ondansetron, oxyCODONE, oxyCODONE, polyethylene glycol, sodium phosphate    Labs:     Recent Labs     03/27/21  0627 03/26/21  2054 03/26/21  1608 03/26/21  1118   Whole Blood Glucose POCT 133* 180* 155* 155*     Recent Labs   Lab 03/26/21  0613 03/22/21  0627   Sodium 134* 132*   Potassium 4.4 4.2   Chloride 100 95*   CO2 26 27   BUN 6.0* 8.0   Creatinine 0.5* 0.5*   EGFR >60.0 >60.0   Glucose 117* 104*   Calcium 8.9 8.7         Recent Labs   Lab 03/26/21  0613 03/22/21  0627   WBC 6.54 4.76   Hgb 13.1 12.5   Hematocrit 39.1 36.8   Platelets 312 266     Recent Labs   Lab 03/26/21  0613 03/22/21  0627   ALT 15 48   AST (SGOT) 10 18   Bilirubin, Total 0.6 0.4   Alkaline Phosphatase 138* 175*                 Imaging personally reviewed, including: all available  No results found.      This note was generated by the Epic EMR system/ Dragon speech recognition and may contain inherent errors or omissions not intended by the user. Grammatical errors, random word insertions, deletions and pronoun errors  are occasional consequences of this technology due to software limitations. Not all errors are caught or corrected. If there are questions or concerns about the content of this note or information contained within the body of this dictation they should be addressed directly with the author for clarification.    Signed by: Alfred Levins, MD

## 2021-03-27 NOTE — Plan of Care (Signed)
Problem: Bladder Management  Goal: LTG: Patient will be able to void spontaneously with PVR of less than 100 cc.   Outcome: Progressing     Problem: Endocrine  Goal: LTG: Prior D/C patient will demonstrate the ability to self manage diabetes at home and what to do when symptoms arise.  Outcome: Progressing     Problem: Pain Management  Goal: LTG: Pain will verbalized adequate pain control with scheduled/ current pain medication prior to D/C.  Outcome: Progressing  Note: Continue to require pain medication for pain level greater than 3/10      Problem: Safety Risk  Goal: LTG: Patient will call for assistance for out of bed activities and remain free from falls through hospitalization.  Outcome: Progressing  Note: Patient is encouraged and educated on the use of her call light 100% of the time prior to all transfers in order to prevent falls with injury     Problem: Compromised Tissue integrity  Goal: Damaged tissue is healing and protected  Outcome: Progressing  Goal: Nutritional status is improving  Outcome: Progressing     Problem: Self Care Management  Goal: LTG: Pt will complete all ADLs and household transfers with mod I and AD/DME/AE PRN  Outcome: Progressing     Problem: Mobility  Goal: LTG: Pt will be mod I for navigating community distances and for curb step in order to safely enter home.   Outcome: Progressing

## 2021-03-28 LAB — COMPREHENSIVE METABOLIC PANEL
ALT: 12 U/L (ref 0–55)
AST (SGOT): 10 U/L (ref 5–34)
Albumin/Globulin Ratio: 1.4 (ref 0.9–2.2)
Albumin: 3.3 g/dL — ABNORMAL LOW (ref 3.5–5.0)
Alkaline Phosphatase: 125 U/L — ABNORMAL HIGH (ref 37–117)
Anion Gap: 6 (ref 5.0–15.0)
BUN: 7 mg/dL (ref 7.0–19.0)
Bilirubin, Total: 0.5 mg/dL (ref 0.2–1.2)
CO2: 29 mEq/L (ref 22–29)
Calcium: 8.7 mg/dL (ref 8.5–10.5)
Chloride: 98 mEq/L — ABNORMAL LOW (ref 100–111)
Creatinine: 0.6 mg/dL (ref 0.6–1.0)
Globulin: 2.3 g/dL (ref 2.0–3.6)
Glucose: 105 mg/dL — ABNORMAL HIGH (ref 70–100)
Potassium: 4.8 mEq/L (ref 3.5–5.1)
Protein, Total: 5.6 g/dL — ABNORMAL LOW (ref 6.0–8.3)
Sodium: 133 mEq/L — ABNORMAL LOW (ref 136–145)

## 2021-03-28 LAB — GFR: EGFR: >60

## 2021-03-28 LAB — GLUCOSE WHOLE BLOOD - POCT
Whole Blood Glucose POCT: 131 mg/dL — ABNORMAL HIGH (ref 70–100)
Whole Blood Glucose POCT: 153 mg/dL — ABNORMAL HIGH (ref 70–100)
Whole Blood Glucose POCT: 117 mg/dL — ABNORMAL HIGH (ref 70–100)
Whole Blood Glucose POCT: 148 mg/dL — ABNORMAL HIGH (ref 70–100)

## 2021-03-28 MED ORDER — ROSUVASTATIN CALCIUM 10 MG PO TABS
10.0000 mg | ORAL_TABLET | Freq: Every evening | ORAL | 0 refills | Status: AC
Start: 2021-03-29 — End: 2021-04-28

## 2021-03-28 MED ORDER — INSULIN GLARGINE 100 UNIT/ML SC SOLN
10.0000 [IU] | Freq: Every evening | SUBCUTANEOUS | Status: AC
Start: 2021-03-29 — End: ?

## 2021-03-28 MED ORDER — FLUTICASONE PROPIONATE 50 MCG/ACT NA SUSP
1.0000 | Freq: Two times a day (BID) | NASAL | Status: AC
Start: 2021-03-29 — End: ?

## 2021-03-28 MED ORDER — CETIRIZINE HCL 10 MG PO TABS
10.0000 mg | ORAL_TABLET | Freq: Every day | ORAL | Status: AC
Start: 2021-03-30 — End: ?

## 2021-03-28 MED ORDER — METFORMIN HCL 500 MG PO TABS
1000.0000 mg | ORAL_TABLET | Freq: Two times a day (BID) | ORAL | Status: DC
Start: 2021-03-28 — End: 2021-03-29
  Administered 2021-03-28 – 2021-03-29 (×2): 1000 mg via ORAL
  Filled 2021-03-28 (×2): qty 2

## 2021-03-28 MED ORDER — NICOTINE 14 MG/24HR TD PT24
1.0000 | MEDICATED_PATCH | Freq: Every day | TRANSDERMAL | Status: AC
Start: 2021-03-30 — End: ?

## 2021-03-28 MED ORDER — CALCIUM CITRATE-VITAMIN D 315-250 MG-UNIT PO TABS
1.0000 | ORAL_TABLET | Freq: Every day | ORAL | Status: AC
Start: 2021-03-30 — End: ?

## 2021-03-28 MED ORDER — SODIUM CHLORIDE 1 G PO TABS
1.0000 g | ORAL_TABLET | Freq: Two times a day (BID) | ORAL | 0 refills | Status: DC
Start: 2021-03-29 — End: 2021-03-29

## 2021-03-28 MED ORDER — FAMOTIDINE 20 MG PO TABS
20.0000 mg | ORAL_TABLET | Freq: Every day | ORAL | 0 refills | Status: AC
Start: 2021-03-29 — End: 2021-04-28

## 2021-03-28 MED ORDER — CHOLECALCIFEROL 25 MCG (1000 UT) PO TABS
25.0000 ug | ORAL_TABLET | Freq: Every day | ORAL | Status: AC
Start: 2021-03-30 — End: ?

## 2021-03-28 MED ORDER — TRAMADOL HCL 50 MG PO TABS
50.0000 mg | ORAL_TABLET | Freq: Two times a day (BID) | ORAL | 0 refills | Status: AC | PRN
Start: 2021-03-29 — End: 2021-04-05

## 2021-03-28 MED ORDER — LETROZOLE 2.5 MG PO TABS
2.5000 mg | ORAL_TABLET | Freq: Every day | ORAL | Status: AC
Start: 2021-03-30 — End: ?

## 2021-03-28 MED ORDER — OXYCODONE HCL 5 MG PO TABS
10.0000 mg | ORAL_TABLET | Freq: Two times a day (BID) | ORAL | 0 refills | Status: AC | PRN
Start: 2021-03-29 — End: 2021-04-05

## 2021-03-28 MED ORDER — LISINOPRIL 2.5 MG PO TABS
2.5000 mg | ORAL_TABLET | Freq: Two times a day (BID) | ORAL | Status: AC
Start: 2021-03-29 — End: ?

## 2021-03-28 MED ORDER — DEXTROMETHORPHAN POLISTIREX ER 30 MG/5ML PO SUER
30.0000 mg | Freq: Two times a day (BID) | ORAL | 0 refills | Status: AC
Start: 2021-03-29 — End: 2021-04-28

## 2021-03-28 MED ORDER — SODIUM CHLORIDE 1 G PO TABS
1.0000 g | ORAL_TABLET | Freq: Three times a day (TID) | ORAL | Status: DC
Start: 2021-03-28 — End: 2021-03-29
  Administered 2021-03-28 – 2021-03-29 (×2): 1 g via ORAL
  Filled 2021-03-28 (×2): qty 1

## 2021-03-28 MED ORDER — TIOTROPIUM BROMIDE MONOHYDRATE 2.5 MCG/ACT IN AERS
2.0000 | INHALATION_SPRAY | Freq: Every morning | RESPIRATORY_TRACT | 0 refills | Status: AC
Start: 2021-03-28 — End: ?

## 2021-03-28 MED ORDER — METFORMIN HCL 500 MG PO TABS
500.0000 mg | ORAL_TABLET | Freq: Two times a day (BID) | ORAL | Status: AC
Start: 2021-03-29 — End: ?

## 2021-03-28 NOTE — Progress Notes (Addendum)
Home Health Referral          Referral from Arlie Solomons (Case Manager) for home health care upon discharge.    By [employer], the patient has the right to freely choose a home care provider.  Arrangements have been made with:    A company of the patients choosing. We have supplied the patient with a listing of providers in your area who asked to be included and participate in Medicare.         [facility], a home care agency that provides both adult home care       services which is a wholly owned and operated by [employer] and participates in [employer]    The preferred provider of your insurance company. Choosing a home care provider other than your insurance company's preferred provider may affect your insurance coverage.    The Home Health Care Referral Form acknowledging the voluntary selection of the home care company has been completed, signed, and is on file.      Home Health Discharge Information     Your doctor has ordered Skilled Nursing, Physical Therapy, Occupational Therapy, and Home Health Aide in-home service(s) for you while you recuperate at home, to assist you in the transition from hospital to home.      The agency that you or your representative chose to provide the service:  Name of Home Health Agency Placement: [facility](formerly [facility])  [phone]    The above services were set up by:  Romualdo Bolk, RN  [facility] Liaison)   Phone      [phone]                                Additional comments:   If you have not heard from your home health agency within 24-48 hours after discharge please call your agency to arrange a time for your first visit.  For any scheduling concerns or questions related to home health, such as time or date please contact your home health agency at the number listed above.   HOME HEALTH REFERRAL    PATIENT DEMOGRAPHICS:        Name: [name] [name]    Discharge Address: [address]  [place]  [address]      Primary Telephone Number:  [phone] patient cell  Secondary Telephone Number: [phone] [phone]  Emergency Contact and Number: Extended Emergency Contact Information  Primary Emergency Contact: DAVIS,JENNIFER  Mobile Phone: [phone]  Relation: Daughter  Interpreter needed? No      Ordering Physician: Verdis Frederickson, MD    PCP:  Dr. Audery Amel [phone])    Following Physician:  Dr. Audery Amel [phone]       Language/Communication Barrier:      No    Primary Diagnosis and Reason for Services:  SN,PT,OT,HHA   Diagnosis    Lumbar compression fracture, closed, initial encounter   COPD exacerbation   Hypertension   Diabetes mellitus type 2   Hyperlipidemia       70 y.o. female who was admitted to acute rehab on 6/17, has a history of hypertension, diabetes mellitus type 2 with hyperglycemia, hyperlipidemia, COPD, lung cancer, breast cancer nicotine dependence had presented to [facility] emergency room with back pain was found to have abnormal edema within the enhancement including T10, T11, L1 and L2 vertebral bodies.  There was superior endplate fracture involving T11  and L1 vertebral body with vertebral body height loss.  She had COPD exacerbation.   #Mild COPD exacerbation/wheezing/likely exacerbated by allergies    #Compression fracture T11 and L1 vertebral bodies   #Hypertension   #Diabetes mellitus type 2   #Hyponatremia   #Constipation   #Breast Cancer      [employer] (Labs, Wounds, Infusions, etc.):none      Additional Comments:  COVID-19 Screening:  COVID-19    not tested    Precautions/Restrictions:   Weight Bearing Status: no restrictions   Back Brace TLSO when out of bed   Spinal Precautions: no bending, no twisting, no lifting   Other Precautions: Falls     Discharge Date: 03/29/21    Referral Source (PACC/Hospital/Unit): Romualdo Bolk, RN [phone]    Referral Date: 03/28/21         Home Health face-to-face (FTF) Encounter (Order [id number])  Consult  Date:  03/28/2021 Department: [facility] Ordering/Authorizing: Leretha Dykes, MD           Order Information    Order Date/Time Release Date/Time Start Date/Time End Date/Time   03/28/21 10:54 AM None 03/28/21 10:55 AM 03/28/21 10:55 AM       Order Details    Frequency Duration Priority Order Class   Once 1  occurrence Routine Hospital Performed               Standing Order Information    Remaining Occurrences Interval Last Released     0/1 Once 03/28/2021                  Provider Information    Ordering User Ordering Provider Authorizing Provider   Romualdo Bolk, RN Leretha Dykes, MD Leretha Dykes, MD   Attending Provider(s) Admitting Provider PCP   Virl Cagey, MD Virl Cagey, MD Pcp, None, MD     Verbal Order Info    Action Created on Order Mode Entered by Responsible Provider Signed by Signed on   Ordering 03/28/21 1054 Telephone with Francoise Ceo, RN Leretha Dykes, MD             Comments    Precautions/Restrictions:   Weight Bearing Status: no restrictions   Back Brace TLSO when out of bed   Spinal Precautions: no bending, no twisting, no lifting   Other Precautions: Falls       Diagnosis    Lumbar compression fracture, closed, initial encounter   COPD exacerbation   Hypertension   Diabetes mellitus type 2   Hyperlipidemia       70 y.o. female who was admitted to acute rehab on 6/17, has a history of hypertension, diabetes mellitus type 2 with hyperglycemia, hyperlipidemia, COPD, lung cancer, breast cancer nicotine dependence had presented to [facility] emergency room with back pain was found to have abnormal edema within the enhancement including T10, T11, L1 and L2 vertebral bodies.  There was superior endplate fracture involving T11 and L1 vertebral body with vertebral body height loss.  She had COPD exacerbation.   #Mild COPD exacerbation/wheezing/likely exacerbated by allergies    #Compression fracture T11 and L1 vertebral bodies   #Hypertension    #Diabetes mellitus type 2   #Hyponatremia   #Constipation   #Breast Cancer     Home nursing required for skilled assessment including cardiopulmonary assessment and dietary education for disease management, and medication instruction. Home PT/OT required for gait and balance training, strengthening, mobility, fall prevention, and ADL  training.                Home Health face-to-face (FTF) Encounter: Patient Communication     Not Released  Not seen         Order Questions    Question Answer   Date I saw the patient face-to-face: 03/28/2021   Evidence this patient is homebound because: E.  Requires assistance of another person or assistive device to ambulate > 5 feet    F.  Deconditioned due to advance disease process requiring assistance to leave home    N.  Impaired mobility d/t pain, arthritis, weakness that compromises patient safety    C.  Decreased endurance, strength, ROM, cadence, safety/judgment during mobility   Medical conditions that necessitate Home Health care: B.  Functional impairment due to recent hospitalization/procedure/treatment    C.  Risk for complication/infection/pain requiring follow up and monitoring    D.  Chronic illness & risk for re-hospitalization due to unstable disease status    F.  New diagnosis & treatment requiring follow up monitoring and management    H.  Multiple new medications requiring management and monitoring   Per clinical findings, following services are medically necessary: Skilled Nursing    PT    OT   Clinical findings that support the need for Skilled Nursing. SN will: C. Monitor for signs and symptoms of exacerbation of disease and management    D. Review medication reconciliation, manage and educate on use and side effects    G. Educate on new diagnosis, treatment & management to prevent re-hospitalization    H. Assess cardiopulmonary status and monitor for signs &symptoms of exacerbation    I.  Educate dietary and or fluid restrictions and weight management    Clinical findings that support the need for Physical Therapy. PT will A.  Evaluate and treat functional impairment and improve mobility    C.  Educate on weight bearing status, stair/gait training, balance & coordination    D.  Provide services to help restore function, mobility, and releive pain    E.  Educate on functional mobility; bed, chair, sit, stand and transfer activities    F.  Perform home safety assessment & develop safe in home exercise program    G.  Implement activities to improve stance time, cadence & step length    H.  Educate on the safe use of assistive device/ durable medical equipment    I.  Instruct on restorative activities to restore ability to perform ADL   OT will provide assistance with: Home program to improve ability to perform ADLs    Recovery and maintenance skills    Basic motor function and reasoning abilities    Strategies to compensate for loss of function    Restorative program to improve mobility and independence   Other (please specify) Home health aide: see comments section for additional orders and information; Dr. Audery Amel [phone]),PCP follows patient in the community                    Process Instructions    Please select Home Care Services medically necessary.     Based on the above findings, I certify that this patient is confined to the home and needs intermittent skilled nursing care, physical therapry and / or speech therapy or continues to need occupational therapy. The patient is under my care, and I have initiated the establishment of the plan of care. This patient will be followed by a physician  who will periodically review the plan of care.      Collection Information            Consult Order Info    ID Description Priority Start Date Start Time   [id number] Home Health face-to-face (FTF) Encounter Routine 03/28/2021 10:55 AM   Provider Specialty Referred to   ______________________________________ _____________________________________                          Verbal Order Info    Action Created on Order Mode Entered by Responsible Provider Signed by Signed on   Ordering 03/28/21 1054 Telephone with Francoise Ceo, RN Leretha Dykes, MD             Patient Information    Patient Name   [name] [name] Legal Sex   Female DOB   1950/10/16       Additional Information    Associated Reports External References   Priority and Order Details [facility]  [facility]     Patient Name: [name] [name], [name] [name]     MRN: [id number]      CSN: [id number]         Account Information     Hosp Acct #  [id number] Patient Class  Inpt Rehab Service  Physical Medicine and Rehabilitation Accommodation Code  Semi-Private      Admission Information     Admitting Physician:  Attending Physician: Virl Cagey, MD  Virl Cagey, MD Unit  [facility] HEAD/STRO* L&D Status      Admitting Diagnosis: L1 and T11 Fracture; Lumbar compression fracture, closed, initial encounter Room / Bed  M502/M502-01 L&D - Last Menstrual Cycle      Chief Complaint:       Admit Type:  Admit Date/Time:  Discharge Date/Time: Elective  03/16/2021 / 2130   / Length of Stay: 12 Days    L&D EDD  Estimated Date of Delivery: None noted.      Patient Information              Home Address: [address]  [place] [address] Employer:  Employer Address:       ,     Main Phone: [phone] Employer Phone:     SSN: [id number]       DOB: 1951-01-19 (70 yrs)       Sex: Female Primary Care Physician: Dr. Audery Amel [phone]   Marital Status: Divorced Referring Physician:       Not On File Referring, *   Race: White or Caucasian       Ethnicity: Not of Hispanic/Latino/S*       Emergency Contacts  Name Home Phone Work Phone Mobile Phone Relationship Christella Hartigan   DAVIS,JENNIFER     [phone] Daughter           Guarantor Information     Guarantor Name: CONSETTA, COSNER ID: [id number]   Guarantor Relationship to Pt: Self Guarantor Type: Personal/Family    Guarantor DOB:    02/16/1951       Guarantor Address: [address]  [place], [place] [address]          Guarantor Home Phone: [phone] Guarantor Employer:        Guarantor Work Phone:   Pensions consultant Emp Phone:  Primary Insurance     Insurance Name: [facility] PART A AND B Subscriber Name: [employer] Address:    [address]  [place] [address] Subscriber DOB: 11/16/1950      Subscriber ID: [id number]   Insurance Phone:   Pt Relationship to Sub:   Self   Insurance ID:         Group Name:   Preauthorization #: npr   Group #:   Preauthorization Days:        Tourist information centre manager Name: OUT OF Conservation officer, nature OUT OF STATE PPO Subscriber Name: [facility]   Insurance Address:    [address] [address]  [place], [place] [address] Subscriber DOB: 01-13-1951      Subscriber ID: [id number]   Insurance Phone:   Pt Relationship to Sub:   Self   Insurance ID:         Group Name:   Preauthorization #:     Group #: [id number] Preauthorization Days:

## 2021-03-28 NOTE — Discharge Instr - AVS First Page (Addendum)
Reason for your Hospital Admission:  ***      Instructions for after your discharge:  ***

## 2021-03-28 NOTE — Progress Notes (Addendum)
MEDICINE CONSULT FOLLOW-UP    Date Time: 03/28/21 3:08 PM  Patient Name: [facility]  Attending Physician: Virl Cagey, MD  Consulting Physician: Alfred Levins, MD, MD      Assessment:     Active Hospital Problems    Diagnosis    Lumbar compression fracture, closed, initial encounter   COPD exacerbation  Hypertension  Diabetes mellitus type 2  Hyperlipidemia    70 y.o. female who was admitted to acute rehab on 6/17, has a history of hypertension, diabetes mellitus type 2 with hyperglycemia, hyperlipidemia, COPD, lung cancer, breast cancer nicotine dependence had presented to [facility] emergency room with back pain was found to have abnormal edema within the enhancement including T10, T11, L1 and L2 vertebral bodies.  There was superior endplate fracture involving T11 and L1 vertebral body with vertebral body height loss.  She had COPD exacerbation.    Recommendations:     #Allergies  -- Continue Zyrtec  --Continue Spiriva  -- Continue Flonase  -- can take robitussin as needed after discharge as well  -- Systemic steroids not indicated  --As needed nebulizers     #Compression fracture T11 and L1 vertebral bodies  --Pain control- still requiring oxycodone 10mg  . Continue bowel regimen on discharge  --PT/OT  - Ca vitamin D  - PCP f/u for osteopenia evaluation/treatment     #Hypertension  --Blood pressures well controlled  --Continue lisinopril 2.5 mg every 12 hours     #Diabetes mellitus type 2  --Lantus 10 units at bedtime  --increase metformin to 1000mg  twice a day  --Sliding scale insulin coverage  --Accu-Cheks before every meal and at bedtime  -- Patient's Accu-Cheks were reviewed, well controlled. Will check with patient whether she was using insulin prior to admissin. No A1C in chart from admission     #Hyponatremia  --Increase sodium chloride tablets 1 g TID daily  -- Sodium is stable    #Constipation  - continue pericolace  - add miralax scheduled daily    #Breast Cancer  - continue letrozole    Discussed with  patient, staff      Subjective:     CC: follow-up: COPD exacerbation, lumbar compression fracture    HPI/Subjective:     Patient continues to fee l fatigued .No acute issues. Denies fevers, chills. Is constipated.    Review of Systems:   Review of Systems - negative except as above in HPI    Physical Exam:   Temp:  [97.5 F (36.4 C)-98.6 F (37 C)] 97.5 F (36.4 C)  Heart Rate:  [71-109] 96  Resp Rate:  [16-17] 16  BP: (110-146)/(68-78) 120/68  Body mass index is 21.72 kg/m.    Intake/Output Summary (Last 24 hours) at 03/28/2021 1508  Last data filed at 03/28/2021 0900  Gross per 24 hour   Intake 480 ml   Output --   Net 480 ml     Weight Monitoring 03/16/2021 03/18/2021 03/25/2021   Height 165.1 cm - -   Weight 60.7 kg 60 kg 59.2 kg   Weight Method - Bed Scale -   BMI (calculated) 22.3 kg/m2 - -         General: awake, in no acute distress.  Cardiovascular: regular rate and rhythm, no murmurs, rubs or gallops  Lungs: clear to auscultation bilaterally, without wheezing, rhonchi, or rales  Abdomen: soft, non-tender, non-distended; no palpable masses, no hepatosplenomegaly, normoactive bowel sounds, no rebound or guarding  Extremities: no clubbing, cyanosis, or edema  Neuro- alert, oriented x  3      Meds:   Medications were reviewed:  Scheduled Meds:  Current Facility-Administered Medications   Medication Dose Route Frequency    calcium citrate-vitamin D  1 tablet Oral Daily with lunch    cetirizine  10 mg Oral Daily    dextromethorphan polistirex ER  30 mg Oral Q12H SCH    famotidine  20 mg Oral Daily with dinner    fluticasone  1 spray Each Nare BID    heparin (porcine)  5,000 Units Subcutaneous Q8H SCH    insulin glargine  10 Units Subcutaneous QHS    insulin lispro  1-3 Units Subcutaneous QHS    insulin lispro  1-5 Units Subcutaneous TID AC    letrozole  2.5 mg Oral Daily    lisinopril  2.5 mg Oral Q12H SCH    metFORMIN  500 mg Oral BID Meals    nicotine  1 patch Transdermal Daily    polyethylene glycol  17 g Oral  Daily    rosuvastatin  10 mg Oral QHS    senna-docusate  2 tablet Oral QHS    sodium chloride  1 g Oral BID    tiotropium  2 puff Inhalation QAM    vitamin D (cholecalciferol)  25 mcg Oral Daily     Continuous Infusions:  PRN Meds:.acetaminophen, albuterol, alum & mag hydroxide-simethicone, bisacodyl, bisacodyl, Nursing communication: Adult Hypoglycemia Treatment Algorithm **AND** glucagon (rDNA) **AND** dextrose **AND** dextrose **AND** dextrose, melatonin, ondansetron **OR** ondansetron, oxyCODONE, oxyCODONE, polyethylene glycol, sodium phosphate    Labs:     Recent Labs     03/28/21  1104 03/28/21  0605 03/27/21  2055 03/27/21  1652   Whole Blood Glucose POCT 153* 131* 143* 135*     Recent Labs   Lab 03/28/21  0451 03/26/21  0613 03/22/21  0627   Sodium 133* 134* 132*   Potassium 4.8 4.4 4.2   Chloride 98* 100 95*   CO2 29 26 27    BUN 7.0 6.0* 8.0   Creatinine 0.6 0.5* 0.5*   EGFR >60.0 >60.0 >60.0   Glucose 105* 117* 104*   Calcium 8.7 8.9 8.7         Recent Labs   Lab 03/26/21  0613 03/22/21  0627   WBC 6.54 4.76   Hgb 13.1 12.5   Hematocrit 39.1 36.8   Platelets 312 266     Recent Labs   Lab 03/28/21  0451 03/26/21  0613   ALT 12 15   AST (SGOT) 10 10   Bilirubin, Total 0.5 0.6   Alkaline Phosphatase 125* 138*                 Imaging personally reviewed, including: all available  No results found.      This note was generated by the Epic EMR system/ Dragon speech recognition and may contain inherent errors or omissions not intended by the user. Grammatical errors, random word insertions, deletions and pronoun errors  are occasional consequences of this technology due to software limitations. Not all errors are caught or corrected. If there are questions or concerns about the content of this note or information contained within the body of this dictation they should be addressed directly with the author for clarification.    Signed by: Alfred Levins, MD

## 2021-03-28 NOTE — Discharge Summary (Signed)
REHABILITATION MEDICINE  DISCHARGE SUMMARY AND DISCHARGE NOTE    Patient Identification  [name] [name] is a 70 y.o. female.  DOB:  05/06/1951  Admit Date:  03/16/2021  Discharge date:  03/29/2021  Attending Provider: Virl Cagey, MD / Leretha Dykes., MD covering physician                                    Admission Diagnoses: Lumbar compression fracture, closed, initial encounter [S32.000A]    Disposition:       Home to [place] with home therapy f/u back home    Patient Instructions:       Audery Amel, MD / Primary Care  Call to schedule follow up within one week for  labs and home therapy.  [address]  [place] [address]  Phone: [phone]     Follow up with Neurosurgery is recommended., Please consult with your Primary Care Physician for a provider referral, as needed.     Sea Virl Son, MD / Physical Medicine and Rehabilitation  Contact information provided for your reference.  [facility]  [address]  [place] [address]  Phone: [phone]    No driving until cleared by a physician or occupational therapist.      Pt will take salt suppl po three times per day and will f/u with PCP within one week for lab check. BMP.  Pt aware.       Discharge Medication List        Taking      calcium citrate-vitamin D 315-250 MG-UNIT Tabs  Dose: 1 tablet  Commonly known as: CITRACAL+D  Start taking on: March 30, 2021  Take 1 tablet by mouth daily with lunch     cetirizine 10 MG tablet  Dose: 10 mg  Commonly known as: ZyrTEC  Start taking on: March 30, 2021  Take 1 tablet (10 mg total) by mouth daily     cholecalciferol 25 MCG (1000 UT) tablet  Dose: 25 mcg  Commonly known as: vitamin D3  Start taking on: March 30, 2021  Take 1 tablet (25 mcg total) by mouth daily     dextromethorphan polistirex ER 30 MG/5ML suspension  Dose: 30 mg  Commonly known as: DELSYM  Start taking on: March 29, 2021  Take 5 mLs (30 mg total) by mouth every 12 (twelve) hours     famotidine 20 MG tablet  Dose: 20 mg  Commonly  known as: PEPCID  Start taking on: March 29, 2021  Take 1 tablet (20 mg total) by mouth daily with dinner     fluticasone 50 MCG/ACT nasal spray  Dose: 1 spray  Commonly known as: FLONASE  Start taking on: March 29, 2021  1 spray by Nasal route 2 (two) times daily     insulin glargine 100 UNIT/ML injection  Dose: 10 Units  Commonly known as: LANTUS  Start taking on: March 29, 2021  Inject 10 Units into the skin nightly     letrozole 2.5 MG tablet  Dose: 2.5 mg  Commonly known as: FEMARA  Start taking on: March 30, 2021  Take 1 tablet (2.5 mg total) by mouth daily     lisinopril 2.5 MG tablet  Dose: 2.5 mg  Commonly known as: ZESTRIL  Start taking on: March 29, 2021  Take 1 tablet (2.5 mg total) by mouth every 12 (twelve) hours  metFORMIN 500 MG tablet  Dose: 500 mg  Commonly known as: GLUCOPHAGE  Start taking on: March 29, 2021  Take 1 tablet (500 mg total) by mouth 2 (two) times daily with meals     nicotine 14 MG/24HR  Dose: 1 patch  Commonly known as: NICODERM CQ  Start taking on: March 30, 2021  Place 1 patch onto the skin daily     oxyCODONE 5 MG immediate release tablet  Dose: 10 mg  Commonly known as: ROXICODONE  Start taking on: March 29, 2021  Take 2 tablets (10 mg total) by mouth 2 (two) times daily as needed for Pain     rosuvastatin 10 MG tablet  Dose: 10 mg  Commonly known as: CRESTOR  Start taking on: March 29, 2021  Take 1 tablet (10 mg total) by mouth nightly     sodium chloride 1 g tablet  Dose: 1 g  Start taking on: March 29, 2021  Take 1 tablet (1 g total) by mouth 2 (two) times daily     tiotropium 2.5 MCG/ACT inhalation spray  Dose: 2 puff  Commonly known as: SPIRIVA RESPIMAT  Inhale 2 puffs into the lungs every morning     traMADol 50 MG tablet  Dose: 50 mg  Commonly known as: ULTRAM  Start taking on: March 29, 2021  Take 1 tablet (50 mg total) by mouth 2 (two) times daily as needed for Pain          Medication Review    A complete drug regimen review was completed: Yes  Were any drug issues found during  review?: No    Discharge Diagnoses:  And Hospital Course:       Ambulation and activities of daily living dysfunction     L1 and T11 compression fractures  -- Continue with PT and OT.  Follow endurance.  Follow gains.  -- Estimated length of stay to go home if medically stable is June 30  6/27: Plan for d/c on 6/30 and pt [place] go home [place].  6/30: Pt will be d/c today and f/u with PCP for home therapy arrangement in [place].     Transaminitis: Resolved  -- 6/20 patient is taking Tylenol 1000 mg perhaps once a day as needed for pain.  We will discontinue Crestor 40 mg p.o. nightly.  Follow daily CMP's for now.  -- 6/21 improving trend  --6/23 Resolved.  Consider restarting a lower dose of Crestor.  6/27: will start Crestor 10mg  and f/u LFT in 2 days.    Hypertension  -- Continue regimen.  Continue to monitor.  Some lability noted.  -- 6/21 low blood pressures noted.  Currently on lisinopril 10 mg daily.  Will change lisinopril to 2.5 mg twice daily.  -- 6/22 continue current lisinopril 2.5 mg twice daily.  Blood pressures are improved  6/27: BP 120/69 today.    Hypercholesterolemia  -- 6/20 hold Crestor 40 mg nightly because of elevated LFTs.  Continue to follow LFTs.  6/27: LFT normal. Start low dose of crestor.    Diabetes mellitus type II with hyperglycemia  --6/20: Continue to monitor.    -- Controlled blood glucose levels.  Consult hospitalist medicine.  6/27: BG stable, medicine f/u.  6/30: BG stable.     Hyponatremia  --6/20 possibly due to oral intake.  Continue to monitor.  Encourage oral intake.  Follow daily CMP for now.  --6/21 sodium level stable 129.  Patient reports that as an outpatient she was  on fluid restriction which she observing now.  --6/22 consult nephrology.  -- 6/23 appreciate nephrology efforts.  Improved  6/27: Na+ 134 today.  6/30: Na+ 132 today and increase salt suppl to TID and needs to f/u with PCP within one week to check labs, pt aware.     COPD  -- No complaints of shortness of  breath.  Follow trend  --6/24 patient noticed some wheezing.  Continue with regimen.  Will order nebulizer as needed.  Consult hospitalist medicine.     Allergic rhinitis with rhinorrhea  -- 6/23 trial of Zyrtec and Flonase.  -- 6/24 patient reports improvement overall.     #GI  6/27: Give a dose of supp tonite.    History of lung cancer    History of breast cancer    History of nicotine dependence     DVT prophylaxis: Ambulation and heparin 5000 units subcu every 8 hours  --6/19: Negative bilateral lower limb venous duplex for DVT    Discharge Functional Status:        Roll Left and Right 6      Independent   Sit to Lying 6      Independent   Lying to Sitting on Side of Bed 6      Independent   Sit to Stand 6      Independent   Chair/Bed-to-Chair Transfer 6      Independent   Car Transfer 5      Independent   Walk 10 Feet 6      Independent   Walk 50 Feet with Two Turns 6      Independent   Walk 10 Feet on Uneven Surface 6 RW    Independent   Walk 150 Feet 6      Independent   1 Step (Curb) 6 rw    Independent   4 Steps 6 B rails    Independent   12 Steps 6 B rails    Supervision or touching assistance     Eating 6      Independent   Oral Hygiene 6      Independent   Toileting Hygiene 6 FWW    Independent   Shower/Bathe Self 6 FWW and shower seat    Independent   Upper Body Dressing 6 Independent with TLSO with overhead donning method    Independent   Lower Body Dressing 6      Independent   Putting On/Taking Off Footwear 6      Independent     Toilet Transfer 6 FWW Independent   Picking Up Object 6 FWW and reacher    Independent     History:        Reason for admission: Ambulation and activities of daily living dysfunction due to compression spine fractures L1 and T11.     Date of admission: 03/16/2021     PRECAUTIONS: Falls     History of present illness:   This 70 y.o. year old right hand-dominant female:     [name] is a 70 year-old female with past medical history significant for diabetes mellitus with  hyperglycemia, hypertension hypercholesterolemia, COPD, lung cancer, breast cancer, nicotine dependence who presents to the [facility] ED with c/o right-sided lower back pain that has been ongoing since April. Patient mentioned that she incurred an injury to her back when she tries to pick up a heavy suitcase sometime in April. She was diagnosed with Lumbar sprain by then. Patient came to the ED  today because she was having severe pain that she could not tolerate. She mentioned that she has been taking Tylenol and Motrin and a muscle relaxant that was prescribed by her PCP but has stopped taking the muscle relaxant because she is supposed to take it food and has been eating much. Per attending MRI of the lumbar spine showed degenerative changes. There are multiple vertebral bodies with abnormal edema within enhancement including T10, T11, L1, and L2 vertebral bodies. There is evidence for a superior endplate fracture involving L1 vertebral body with approximately 15% vertebral body height loss. There is also a superior endplate fracture involving T11. No large associated soft tissue mass or abnormal enhancement is identified in the remaining vertebral bodies. The findings could represent multiple acute osteopenic compression fractures. Although less likely pathologic fractures cannot be excluded, especially involving the L1 vertebral body.  On admission to the hospital she was had hyponatremia which has since resolved. She is from [place] and is in [place] visiting her daughter. She lives in a 1 level home with 1 step to enter and no stairs inside. Prior to her recent decline she was independent for all ADLs and mobility    Past Medical History:   Diagnosis Date    Breast cancer      COPD (chronic obstructive pulmonary disease)      Diabetes mellitus      Hypertension      Lung cancer      Discharge Exam:  Vitals:    03/28/21 1156 03/28/21 1418 03/28/21 1647 03/28/21 2135   BP: 120/72 120/68 127/66 147/79   Pulse:  97 96 (!) 103 83   Resp:   18    Temp:   98.2 F (36.8 C)    TempSrc:   Oral    SpO2:   95% 96%   Weight:       Height:           Patient is getting ready for discharge.  No new issues.  Alert and NAD  Cardiac:  Regular rate and rhythm  Lungs:  Clear to auscultation  Abdomen: soft, with bowel sounds, nontender, non-distended  Extremities:  No calf tenderness. No pitting edema of BLE  Wearing TLSO brace.      No results for input(s): GLUCOSEWHOLE in the last 24 hours.    Recent Labs   Lab 03/26/21  0613 03/22/21  0627   WBC 6.54 4.76   Hgb 13.1 12.5   Hematocrit 39.1 36.8   Platelets 312 266        Recent Labs   Lab 03/28/21  0451 03/26/21  0613 03/22/21  0627   Sodium 133* 134* 132*   Potassium 4.8 4.4 4.2   Chloride 98* 100 95*   CO2 29 26 27    BUN 7.0 6.0* 8.0   Creatinine 0.6 0.5* 0.5*   Calcium 8.7 8.9 8.7   Albumin 3.3* 3.3* 3.2*   Protein, Total 5.6* 5.8* 5.6*   Bilirubin, Total 0.5 0.6 0.4   Alkaline Phosphatase 125* 138* 175*   ALT 12 15 48   AST (SGOT) 10 10 18    Glucose 105* 117* 104*             Results       Procedure Component Value Units Date/Time    Glucose Whole Blood - POCT [[id number]]  (Abnormal) Collected: 03/28/21 2055     Updated: 03/28/21 2056     Whole Blood Glucose POCT 148 mg/dL  Glucose Whole Blood - POCT [[id number]]  (Abnormal) Collected: 03/28/21 1647     Updated: 03/28/21 1649     Whole Blood Glucose POCT 117 mg/dL     Glucose Whole Blood - POCT [[id number]]  (Abnormal) Collected: 03/28/21 1104     Updated: 03/28/21 1108     Whole Blood Glucose POCT 153 mg/dL     Comprehensive metabolic panel [[id number]]  (Abnormal) Collected: 03/28/21 0451    Specimen: Blood Updated: 03/28/21 0654     Glucose 105 mg/dL      BUN 7.0 mg/dL      Creatinine 0.6 mg/dL      Sodium 086 mEq/L      Potassium 4.8 mEq/L      Chloride 98 mEq/L      CO2 29 mEq/L      Calcium 8.7 mg/dL      Protein, Total 5.6 g/dL      Albumin 3.3 g/dL      AST (SGOT) 10 U/L      ALT 12 U/L      Alkaline Phosphatase 125 U/L       Bilirubin, Total 0.5 mg/dL      Globulin 2.3 g/dL      Albumin/Globulin Ratio 1.4     Anion Gap 6.0    GFR [[id number]] Collected: 03/28/21 0451     Updated: 03/28/21 0654     EGFR >60.0       Glucose Whole Blood - POCT [[id number]]  (Abnormal) Collected: 03/28/21 0605     Updated: 03/28/21 0610     Whole Blood Glucose POCT 131 mg/dL             Radiology: all results from this admission  US Venous Low Extrem Duplx Dopp Comp Bilat    Result Date: 03/18/2021   No deep venous thrombosis of either lower extremity. Fonnie Mu, DO  03/18/2021 10:30 AM     Discharge planning, preparation, and coordination took more than 35 minutes.    Leretha Dykes, MD  [facility]      If there are questions or concerns about the content of this note or information contained within the body of this dictation they should be addressed directly with the author for clarification.

## 2021-03-28 NOTE — Progress Notes (Signed)
Case Management  Inpatient Rehabilitation Discharge Summary    Patient Name: [name] [name]  MRN: [id number]  Date of Birth: December 28, 1950  Rehab Diagnosis: Lumbar compression fracture, closed, initial encounter [S32.000A]    Discharge Date: Mar 29, 2021  Disposition: Home with home health services  Transportation: Family to transport by private vehicle    Your rehab doctor ordered in home visits with Skilled Nursing, Physical Therapy, Occupational Therapy, and a Home Health Aide while you recuperate.     The agency you chose to provide home visits is:  [facility]  Phone: [phone]     The home health agency will call you to schedule your first visit. At your first  visit, they will discuss a schedule for continued visits. If you do not receive a call by Tuesday, July 5,, please call the agency at the phone number above and ask to schedule a visit.     Home Health was arranged by:  Jeanella Flattery, RN  Post Acute Care Coordinator / [facility]  Phone: [phone]      RECOMMENDED FOLLOW UP:    Audery Amel, MD / Primary Care  Call to schedule follow up.   [address]  [place] [address]  Phone: [phone]    Follow up with Neurosurgery is recommended., Please consult with your Primary Care Physician for a provider referral, as needed.     Sea Virl Son, MD / Physical Medicine and Rehabilitation  Contact information provided for your reference.   [facility]  [address]  [place] [address]  Phone: [phone]      ADDITIONAL INFORMATION:  Visit [email or web address] and click login or sign up for MyChart to access your medical record including your inpatient rehabilitation discharge summaries.     To reach medical records by phone, call: [phone]      Discharge instruction reviewed by:  Arlie Solomons, MSW, ACM-SW  Social Worker Case Manager II / [facility]  Phone: [phone]  [email or web address]

## 2021-03-28 NOTE — Progress Notes (Signed)
Post Acute Care Coordinator [facility]) [facility] Liaison) called and spoke with Junious Dresser, intake coordinator at [facility] [phone]) who said they may be able to staff case and requested referral faxed to her to review(fax [phone]). [facility] faxed patient information and awaiting call back from agency if they can accept the patient.

## 2021-03-28 NOTE — Progress Notes (Signed)
Therapeutic Recreation  Inpatient Rehabilitation Discharge Summary    Patient Name:  [name] [name]       Medical Record Number: [id number]   Date of Birth: 03-29-1951  Sex: Female          Room/Bed:  M502/M502-01    Rehabilitation Precautions/Restrictions:  Weight Bearing Status: no restrictions  Back Brace Applied: OOB (TLSO)  Spinal Precautions: no bending, no twisting, no lifting  Other Precautions: Falls    Rehab Diagnosis: Lumbar compression fracture, closed, initial encounter [S32.000A]     Subjective   Patient/Caregiver Goals: Be in less pain and be able to move.    Objective   Pt participated in TR services with modified independence.  Encounter Problems       Encounter Problems (Resolved)       Endurance/Activity Tolerance       Pt will participate in TR services 2-3x/wk to address their endurance/activity tolerance during leisure engagement (Completed)       Start: 03/19/21   Met: 03/28/21            Leisure Skills       Pt will participate in TR services 2-3x/wk to address their leisure independence  (Completed)       Start: 03/19/21   Met: 03/28/21            Psychosocial Support/Stimulation       Pt will participate in TR services 2-3x/wk for psychosocial benefits of leisure engagement (Completed)       Start: 03/19/21   Met: 03/28/21                Assessment & Plan   Summary of Progress and Current Status: All TR goals have been met.

## 2021-03-28 NOTE — Group Note (Signed)
Rehab - Gait  Inpatient Rehabilitation Group Note    Patient Name:  [name] [name]       Medical Record Number: [id number]   Date of Birth: 07/20/1951  Sex: Female          Rehab Diagnosis: Lumbar compression fracture, closed, initial encounter [S32.000A]     Today's Date: 03/28/2021   Group Start Time: 1100   Group End Time: 1200   Group Facilitators: Geronimo Running, PT  Group Topic: Rehab - Gait  Discipline Led: PT  Number of Patient Participants: 3      Treatment Provided  Instructed pt in 5 reps sit <-> stands Indep to RW.  Pt participated in standing LE ther ex teaching with UE support on RW for strengthening of all major muscle groups and balance practice.   Gait training provided for 252ft with RW Mod I.  Stair training up/down 1 stair x2 reps with RW and Supv progressing to Mod I.    See Education Activity for details.    Assessment  Benefited from support and encouragement of peers in a social setting  Participant(s) had good participation  Benefited from psychosocial stimulation in a group setting  Socialized with peer and therapists present

## 2021-03-28 NOTE — Progress Notes (Signed)
Post Acute Care Coordinator [facility]) [facility] Liaison)  received phone call from [place] at [facility] that the home care agency is accepting the patient and will call her to schedule first visit. [facility] will fax signed orders to agency.

## 2021-03-28 NOTE — OT Progress Note (Signed)
Occupational Therapy  Inpatient Rehabilitation Daily Progress Note    Patient Name:  [name] [name]       Medical Record Number: [id number]   Date of Birth: [birth year]  Sex: Female          Room/Bed:  M502/M502-01    Rehabilitation Precautions/Restrictions:  Weight Bearing Status: no restrictions  Back Brace Applied: OOB (TLSO)  Spinal Precautions: no bending, no twisting, no lifting  Other Precautions: Falls    Rehab Diagnosis: Lumbar compression fracture, closed, initial encounter [S32.000A]     Subjective   Patient Report: "How many steps per minute is normal?"   Patient/Caregiver Goals: Be in less pain and be able to move.  Pain Assessment:  Pain Assessment: No/denies pain    Objective   Interventions:   Pt received seated upright in w/c, alert, and agreeable to OT tx. Pt ambulated to/from first floor rehab gym without rest breaks with FWW and mod I, however increased time required 2/2 slow pace. Pt engaged in on NuStep recumbent bike at level 3 resistance with use of BLEs only in order to promote increased strength and cardiopulmonary endurance necessary for returned engagement in meaningful occupations. Pt required 2 rest breaks at and intervals. Encouraged to remain at >35 SPM, however unable to maintain 2/2 extent of deconditioning, therefore averaged at 26 SPM throughout course of activity. Pt rated activity as moderately difficult.     Assessment & Plan   Pt demonstrates significant deconditioning, however it is unclear to what degree pt was deconditioned at baseline 2/2 sedentary lifestyle. Pt will benefit from transition to home health OT with additional reinforcement on importance of remaining active and engaging in strengthening exercises to promote long-term health.     Plan:  Risks/Benefits/POC Discussed with Pt/Family: With patient  Treatment Interventions: ADL retraining, Functional transfer training, UE strengthening/ROM, Endurance training, Patient/Family training, Equipment  eval/education, Compensatory technique education    Recommendation:  Discharge Recommendation: Home with home health OT  OT Therapy Recommendation: 5-6 days/week, 60-120 mins/day  OT Estimated Length of Stay: 10-14 days from IE (03/17/21)

## 2021-03-28 NOTE — Progress Notes (Signed)
Home Health face-to-face (FTF) Encounter (Order [id number])  Consult  Date: 03/28/2021 Department: [facility] Ordering/Authorizing: [name] Dykes, MD           Order Information    Order Date/Time Release Date/Time Start Date/Time End Date/Time   03/28/21 10:54 AM None 03/28/21 10:55 AM 03/28/21 10:55 AM       Order Details    Frequency Duration Priority Order Class   Once 1  occurrence Routine Hospital Performed               Standing Order Information    Remaining Occurrences Interval Last Released     0/1 Once 03/28/2021                  Provider Information    Ordering User Ordering Provider Authorizing Provider   [name] Bolk, RN [name] Dykes, MD [name] Dykes, MD   Attending Provider(s) Admitting Provider PCP   [name] Cagey, MD [name] Cagey, MD Pcp, None, MD     Verbal Order Info    Action Created on Order Mode Entered by Responsible Provider Signed by Signed on   Ordering 03/28/21 1054 Telephone with [name] Ceo, RN [name] Dykes, MD [name] Dykes, MD 03/28/21 1204           Comments    Precautions/Restrictions:   Weight Bearing Status: no restrictions   Back Brace TLSO when out of bed   Spinal Precautions: no bending, no twisting, no lifting   Other Precautions: Falls       Diagnosis    Lumbar compression fracture, closed, initial encounter   COPD exacerbation   Hypertension   Diabetes mellitus type 2   Hyperlipidemia       70 y.o. female who was admitted to acute rehab on 6/17, has a history of hypertension, diabetes mellitus type 2 with hyperglycemia, hyperlipidemia, COPD, lung cancer, breast cancer nicotine dependence had presented to [facility] emergency room with back pain was found to have abnormal edema within the enhancement including T10, T11, L1 and L2 vertebral bodies.  There was superior endplate fracture involving T11 and L1 vertebral body with vertebral body height loss.  She had COPD exacerbation.   #Mild COPD  exacerbation/wheezing/likely exacerbated by allergies    #Compression fracture T11 and L1 vertebral bodies   #Hypertension   #Diabetes mellitus type 2   #Hyponatremia   #Constipation   #Breast Cancer     Home nursing required for skilled assessment including cardiopulmonary assessment and dietary education for disease management, and medication instruction. Home PT/OT required for gait and balance training, strengthening, mobility, fall prevention, and ADL training.                Home Health face-to-face (FTF) Encounter: Patient Communication     Not Released  Not seen         Order Questions    Question Answer   Date I saw the patient face-to-face: 03/28/2021   Evidence this patient is homebound because: E.  Requires assistance of another person or assistive device to ambulate > 5 feet    F.  Deconditioned due to advance disease process requiring assistance to leave home    N.  Impaired mobility d/t pain, arthritis, weakness that compromises patient safety    C.  Decreased endurance, strength, ROM, cadence, safety/judgment during mobility   Medical conditions that necessitate Home Health care: B.  Functional impairment due to recent hospitalization/procedure/treatment  C.  Risk for complication/infection/pain requiring follow up and monitoring    D.  Chronic illness & risk for re-hospitalization due to unstable disease status    F.  New diagnosis & treatment requiring follow up monitoring and management    H.  Multiple new medications requiring management and monitoring   Per clinical findings, following services are medically necessary: Skilled Nursing    PT    OT   Clinical findings that support the need for Skilled Nursing. SN will: C. Monitor for signs and symptoms of exacerbation of disease and management    D. Review medication reconciliation, manage and educate on use and side effects    G. Educate on new diagnosis, treatment & management to prevent re-hospitalization    H. Assess cardiopulmonary status  and monitor for signs &symptoms of exacerbation    I.  Educate dietary and or fluid restrictions and weight management   Clinical findings that support the need for Physical Therapy. PT will A.  Evaluate and treat functional impairment and improve mobility    C.  Educate on weight bearing status, stair/gait training, balance & coordination    D.  Provide services to help restore function, mobility, and releive pain    E.  Educate on functional mobility; bed, chair, sit, stand and transfer activities    F.  Perform home safety assessment & develop safe in home exercise program    G.  Implement activities to improve stance time, cadence & step length    H.  Educate on the safe use of assistive device/ durable medical equipment    I.  Instruct on restorative activities to restore ability to perform ADL   OT will provide assistance with: Home program to improve ability to perform ADLs    Recovery and maintenance skills    Basic motor function and reasoning abilities    Strategies to compensate for loss of function    Restorative program to improve mobility and independence   Other (please specify) Home health aide: see comments section for additional orders and information; Dr. Audery [name] ([phone]),PCP follows patient in the community                    Process Instructions    Please select Home Care Services medically necessary.     Based on the above findings, I certify that this patient is confined to the home and needs intermittent skilled nursing care, physical therapry and / or speech therapy or continues to need occupational therapy. The patient is under my care, and I have initiated the establishment of the plan of care. This patient will be followed by a physician who will periodically review the plan of care.      Collection Information            Consult Order Info    ID Description Priority Start Date Start Time   [id number] Home Health face-to-face (FTF) Encounter Routine 03/28/2021 10:55 AM    Provider Specialty Referred to   ______________________________________ _____________________________________         Acknowledgement Info    For At Acknowledged By Acknowledged On   Placing Order 03/28/21 1054 Elmer Bales, RN 03/28/21 1106                     Verbal Order Info    Action Created on Order Mode Entered by Responsible Provider Signed by Signed on   Ordering 03/28/21 1054 Telephone with [name] Ceo, RN Dory Peru,  Kelton Pillar, MD [name] Dykes, MD 03/28/21 1204           Patient Information    Patient Name   [name] [name], [name] [name] Legal Sex   Female DOB   04/20/1951       Additional Information    Associated Reports External References   Priority and Order Details [employer]

## 2021-03-28 NOTE — Plan of Care (Signed)
Pt is A&O x 4. Continent of B&B. She is wearing a blue armband but calls appropriately when in need of assistance. Pt stated her pain is diminished after taking PRN oxycodone q4h. Her blood sugars have been under 180 mg/dL.  Problem: Bladder Management  Goal: LTG: Patient will be able to void spontaneously with PVR of less than 100 cc.   Outcome: Progressing     Problem: Endocrine  Goal: LTG: Prior D/C patient will demonstrate the ability to self manage diabetes at home and what to do when symptoms arise.  Outcome: Progressing     Problem: Pain Management  Goal: LTG: Pain will verbalized adequate pain control with scheduled/ current pain medication prior to D/C.  Outcome: Progressing     Problem: Safety Risk  Goal: LTG: Patient will call for assistance for out of bed activities and remain free from falls through hospitalization.  Outcome: Progressing     Problem: Compromised Tissue integrity  Goal: Damaged tissue is healing and protected  Outcome: Progressing  Goal: Nutritional status is improving  Outcome: Progressing

## 2021-03-28 NOTE — Progress Notes (Signed)
PHYSICAL MEDICINE AND REHABILITATION  PROGRESS NOTE -- FACE-TO-FACE ENCOUNTER    Date Time: 03/28/21 4:35 PM  Patient Name: [name] [name],[name] [name]    Admission date:  03/16/2021    Subjective:     Patient states the pain is okay today, had BM yesterday and today.  Patient denies dysuria and no fever or chills.  Patient with no coughing, help with medication, noted increase appetite today.    Functional Status:     Instructed pt in 5 reps sit <-> stands Indep to RW.  Pt participated in standing LE ther ex teaching with UE support on RW for strengthening of all major muscle groups and balance practice.  Gait training provided for 274ft with RW Mod I.  Stair training up/down 1 stair x2 reps with RW and Supv progressing to Mod I.    Pt received seated upright in w/c, alert, and agreeable to OT tx. Pt ambulated to/from first floor rehab gym without rest breaks with FWW and mod I, however increased time required 2/2 slow pace. Pt engaged in on NuStep recumbent bike at level 3 resistance with use of BLEs only in order to promote increased strength and cardiopulmonary endurance necessary for returned engagement in meaningful occupations. Pt required 2 rest breaks at and intervals. Encouraged to remain at >35 SPM, however unable to maintain 2/2 extent of deconditioning, therefore averaged at 26 SPM throughout course of activity. Pt rated activity as moderately difficult.   Medications:   Medication reviewed by me:     Scheduled Meds: PRN Meds:   calcium citrate-vitamin D, 1 tablet, Oral, Daily with lunch  cetirizine, 10 mg, Oral, Daily  dextromethorphan polistirex ER, 30 mg, Oral, Q12H SCH  famotidine, 20 mg, Oral, Daily with dinner  fluticasone, 1 spray, Each Nare, BID  heparin (porcine), 5,000 Units, Subcutaneous, Q8H SCH  insulin glargine, 10 Units, Subcutaneous, QHS  insulin lispro, 1-3 Units, Subcutaneous, QHS  insulin lispro, 1-5 Units, Subcutaneous, TID AC  letrozole, 2.5 mg, Oral, Daily  lisinopril, 2.5  mg, Oral, Q12H SCH  metFORMIN, 1,000 mg, Oral, BID Meals  nicotine, 1 patch, Transdermal, Daily  polyethylene glycol, 17 g, Oral, Daily  rosuvastatin, 10 mg, Oral, QHS  senna-docusate, 2 tablet, Oral, QHS  sodium chloride, 1 g, Oral, TID MEALS  tiotropium, 2 puff, Inhalation, QAM  vitamin D (cholecalciferol), 25 mcg, Oral, Daily      Continuous Infusions:   acetaminophen, 1,000 mg, Q6H PRN  albuterol, 2.5 mg, Q6H PRN  alum & mag hydroxide-simethicone, 30 mL, Q4H PRN  bisacodyl, 10 mg, Daily PRN  bisacodyl, 10 mg, Daily PRN  glucagon (rDNA), 1 mg, PRN   And  dextrose, 250 mL, PRN   And  dextrose, 25 g, PRN   And  dextrose, 25 g, PRN  melatonin, 3 mg, QHS PRN  ondansetron, 4 mg, Q8H PRN   Or  ondansetron, 4 mg, Q8H PRN  oxyCODONE, 10 mg, Q4H PRN  oxyCODONE, 5 mg, Q4H PRN  polyethylene glycol, 17 g, Daily PRN  sodium phosphate, 1 enema, Daily PRN        Medication Review  A complete drug regimen review was completed: Yes  Were any drug issues found during review?: No          Review of Systems:   A comprehensive review of systems was: No fevers, chills, nausea, vomiting, chest pain, shortness of breath, cough, headache, double vision.  All others negative.    Physical Exam:     Vitals:  03/28/21 0838 03/28/21 1118 03/28/21 1156 03/28/21 1418   BP: 146/78 110/69 120/72 120/68   Pulse: (!) 109 92 97 96   Resp:       Temp: 97.5 F (36.4 C)      TempSrc: Oral      SpO2: 95% 93%     Weight:       Height:           Intake and Output Summary (Last 24 hours) at Date Time    Intake/Output Summary (Last 24 hours) at 03/28/2021 1635  Last data filed at 03/28/2021 0900  Gross per 24 hour   Intake 480 ml   Output --   Net 480 ml       P.O.: 240 mL (03/28/21 0900)     Urine: 1 mL (03/27/21 1145)  Bladder Scan Volume (mL): 108 mL (03/19/21 0556)        No acute distress.    Chest rhythmic movement of chest with normal respirations.  Abdomen nondistended  Cardiac regular rhythm  Lungs some expiratory wheezing.  In no acute  distress.    Labs:   No results for input(s): GLUCOSEWHOLE in the last 24 hours.    Recent Labs   Lab 03/26/21  0613 03/22/21  0627   WBC 6.54 4.76   Hgb 13.1 12.5   Hematocrit 39.1 36.8   Platelets 312 266          Recent Labs   Lab 03/28/21  0451 03/26/21  0613 03/22/21  0627   Sodium 133* 134* 132*   Potassium 4.8 4.4 4.2   Chloride 98* 100 95*   CO2 29 26 27    BUN 7.0 6.0* 8.0   Creatinine 0.6 0.5* 0.5*   Calcium 8.7 8.9 8.7   Albumin 3.3* 3.3* 3.2*   Protein, Total 5.6* 5.8* 5.6*   Bilirubin, Total 0.5 0.6 0.4   Alkaline Phosphatase 125* 138* 175*   ALT 12 15 48   AST (SGOT) 10 10 18    Glucose 105* 117* 104*               Results       Procedure Component Value Units Date/Time    Glucose Whole Blood - POCT [[id number]]  (Abnormal) Collected: 03/28/21 1104     Updated: 03/28/21 1108     Whole Blood Glucose POCT 153 mg/dL     Comprehensive metabolic panel [[id number]]  (Abnormal) Collected: 03/28/21 0451    Specimen: Blood Updated: 03/28/21 0654     Glucose 105 mg/dL      BUN 7.0 mg/dL      Creatinine 0.6 mg/dL      Sodium 782 mEq/L      Potassium 4.8 mEq/L      Chloride 98 mEq/L      CO2 29 mEq/L      Calcium 8.7 mg/dL      Protein, Total 5.6 g/dL      Albumin 3.3 g/dL      AST (SGOT) 10 U/L      ALT 12 U/L      Alkaline Phosphatase 125 U/L      Bilirubin, Total 0.5 mg/dL      Globulin 2.3 g/dL      Albumin/Globulin Ratio 1.4     Anion Gap 6.0    GFR [[id number]] Collected: 03/28/21 0451     Updated: 03/28/21 0654     EGFR >60.0       Glucose Whole Blood - POCT [[id number]]  (  Abnormal) Collected: 03/28/21 0605     Updated: 03/28/21 0610     Whole Blood Glucose POCT 131 mg/dL     Glucose Whole Blood - POCT [[id number]]  (Abnormal) Collected: 03/27/21 2055     Updated: 03/27/21 2057     Whole Blood Glucose POCT 143 mg/dL     Glucose Whole Blood - POCT [[id number]]  (Abnormal) Collected: 03/27/21 1652     Updated: 03/27/21 1654     Whole Blood Glucose POCT 135 mg/dL                  Rads:   Radiological Procedure  reviewed.  Radiology Results (24 Hour)       ** No results found for the last 24 hours. **                Assessment and Plan:     Ambulation and activities of daily living dysfunction    L1 and T11 compression fractures  -- Continue with PT and OT.  Follow endurance.  Follow gains.  -- Estimated length of stay to go home if medically stable is June 30  6/27: Plan for d/c on 6/30 and pt will go home to [place].  6/29: We had a team conference today. Please refer to the note for details.  Med rec ocmpleted, will d/c early tomorrow and flying to [facility] and f/u PCP and home therapy there.    Transaminitis: Resolved  -- 6/20 patient is taking Tylenol 1000 mg perhaps once a day as needed for pain.  We will discontinue Crestor 40 mg p.o. nightly.  Follow daily CMP's for now.  -- 6/21 improving trend  --6/23 Resolved.  Consider restarting a lower dose of Crestor.  6/27: will start Crestor 10mg  and f/u LFT in 2 days.  6/28: LFT in am.  6/29: LFT normal and continue with current dose of Crestor and will f/u with PCP for inc in meds.    Hypertension  -- Continue regimen.  Continue to monitor.  Some lability noted.  -- 6/21 low blood pressures noted.  Currently on lisinopril 10 mg daily.  Will change lisinopril to 2.5 mg twice daily.  -- 6/22 continue current lisinopril 2.5 mg twice daily.  Blood pressures are improved  6/27: BP 120/69 today.  6/29: BP stable, 120/68 today.    Hypercholesterolemia  -- 6/20 hold Crestor 40 mg nightly because of elevated LFTs.  Continue to follow LFTs.  6/27: LFT normal. Start low dose of crestor.  6/29: LFT normal.    Diabetes mellitus type II with hyperglycemia  --6/20: Continue to monitor.    -- Controlled blood glucose levels.  Consult hospitalist medicine.  6/27: BG stable, medicine f/u.  6/28: BG stable.  6/29: BG stable.    Hyponatremia  --6/20 possibly due to oral intake.  Continue to monitor.  Encourage oral intake.  Follow daily CMP for now.  --6/21 sodium level stable 129.  Patient reports  that as an outpatient she was on fluid restriction which she observing now.  --6/22 consult nephrology.  -- 6/23 appreciate nephrology efforts.  Improved  6/27: Na+ 134 today.  6/29: Na+ 133 today and pt will continue with salt suppl and f/u PCP in [place], when return home.    COPD  -- No complaints of shortness of breath.  Follow trend  --6/24 patient noticed some wheezing.  Continue with regimen.  Will order nebulizer as needed.  Consult hospitalist medicine.    Allergic rhinitis with rhinorrhea  --  6/23 trial of Zyrtec and Flonase.  -- 6/24 patient reports improvement overall.    #GI  6/27: Give a dose of supp tonite.  6/28: Pt took miralax and if no BM, will try supp tonite. Pt had BM yesterday with supp.    History of lung cancer    History of breast cancer    History of nicotine dependence     DVT prophylaxis: Ambulation and heparin 5000 units subcu every 8 hours  --6/19: Negative bilateral lower limb venous duplex for DVT    Patient Active Problem List   Diagnosis    Lumbar compression fracture, closed, initial encounter       Continue comprehensive and intensive inpatient rehab program, including:   Physical therapy 60-120 min daily, 5-6 times per week, Occupational therapy 60-120 min daily, 5-6 times per week, Case management and Rehabilitation nursing    Signed by: Leretha Dykes, MD    [facility]    If there are questions or concerns about the content of this note or information contained within the body of this dictation they should be addressed directly with the author for clarification.

## 2021-03-28 NOTE — Care and Service Plan (Signed)
Rehab Team Discussion    Patient Name:  [name] [name]       Medical Record Number: [id number]   Date of Birth: June 07, 1951  Sex: Female          Room/Bed:  M502/M502-01    Admitting Diagnosis: Lumbar compression fracture, closed, initial encounter [S32.000A]   Admit Date/Time:  03/16/2021  9:30 PM  Admission Comments: No comment available     Primary Diagnosis: Lumbar compression fracture, closed, initial encounter    Patient Active Problem List    Diagnosis Date Noted    Lumbar compression fracture, closed, initial encounter 03/16/2021       Vital Signs  Blood Pressure: 136/73  Temperature: 97.7 F (36.5 C)  Pulse: 71  Respirations: 16  Pain Scale Used: Numeric Scale (0-10)  Pain Score: 0  Pain Location: Back  Pain Orientation: Lower  Pain Descriptors: Aching  Pain Frequency: Intermittent      Weight and Nutrition  Admission Weight: 60.7 kg (133 lb 13.1 oz)  Current Weight: 59.2 kg (130 lb 8.2 oz)  Diet Type: Bariatric Clears, Thin Liquid    Plan of Care  Anticipated Discharge Date: Mar 29, 2021  Discharge Plan: local extended stay versus home to [facility]  Fall Risk Level: Yellow  Is the duration of therapy 15 hours over 7 days instead of the standard 3 hours of therapy, 5 out of 7 days a week?: No  Physical Therapy Recommendation: 5-6 days/week, 60-120 mins/day, 1:1 treatment  Occupational Therapy Recommendation: 5-6 days/week, 60-120 mins/day      The following is a list of patient problems that have been identified by the interdisciplinary team:   Encounter Problems       Encounter Problems (Active)       Bladder Management       LTG: Patient will be able to void spontaneously with PVR of less than 100 cc.  (Adequate for Discharge)       Expected End: 03/30/21       Patient is continent of bladder currently with insufficient amount of urine. Will monitor PVR's and in and out as ordered.      Goal note on 03/25/21 1827 by Suanne Marker, RN       Mrs. Tejada has been voiding continently without retaining urine.                  Compromised Tissue integrity       Damaged tissue is healing and protected (Adequate for Discharge)       Start: 03/17/21       Interventions:  1. Monitor/assess Braden scale every shift  2. Provide wound care per wound care algorithm  3. Reposition patient every 2 hours and as needed unless able to reposition self  4. Increase activity as tolerated/progressive mobility  5. Relieve pressure to bony prominences for patients at moderate and high risk  6. Avoid shearing injuries   7. Keep intact skin clean and dry  8. Use bath wipes, not soap and water, for daily bathing   9. Use incontinence wipes for cleaning urine, stool and caustic drainage; Foley care as needed   10. Monitor external devices/tubes for correct placement to prevent pressure, friction and shearing   11. Encourage use of lotion/moisturizer on skin  12. Monitor patient's hygiene practices  13. Consult/collaborate with wound care nurse   14. Utilize specialty bed  15. Consider placing an indwelling catheter if incontinence interferes with healing of stage 3 or 4 pressure injury  Nutritional status is improving (Progressing)       Start: 03/17/21       Interventions:  1. Assist patient with eating   2. Allow adequate time for meals   3. Encourage patient to take dietary supplement(s) as ordered   4. Collaborate with Clinical Nutritionist  5. Include patient/patient care companion in decisions related to nutrition            Endocrine       LTG: Prior D/C patient will demonstrate the ability to self manage diabetes at home and what to do when symptoms arise. (Progressing)       Expected End: 04/05/21               Mobility       LTG: Pt will be mod I for navigating community distances and for curb step in order to safely enter home.  (Progressing)       Goal note on 03/22/21 1728 by Aileen Fass, PT       Grossly spv at this time.                 Pain Management       LTG: Pain will verbalized adequate pain control with scheduled/  current pain medication prior to D/C. (Progressing)       Expected End: 03/30/21         Goal note on 03/27/21 1113 by Rubye Beach, RN       Patient rates pain as a 4/10. Oxycodone 5 mg helps patient manage pain.                  Safety Risk       LTG: Patient will call for assistance for out of bed activities and remain free from falls through hospitalization. (Progressing)       Expected End: 04/30/21       No safety issues currently. Patient calls appropriately for out of bed assistance.      Goal note on 03/27/21 1113 by Rubye Beach, RN       Patient will monitor blood pressure while out of bed. Patient's blood pressure tends to drop a little when upright. Provided patient education with the relationship between pain medication and blood pressure to help prevent falls.                     Encounter Problems (Resolved)       Self Care Management       LTG: Pt will complete all ADLs and household transfers with mod I and AD/DME/AE PRN (Completed)       Start: 03/17/21   Met: 03/27/21      Goal note on 03/22/21 0945 by Alfonse Alpers, OT       Pt requires SBA up to min A depending on extent of pain                     Self Care Functional Status:   Admission Assessment Current Status Discharge  Goal   Functional Area: Care Score:  Care Score:    Eating 6 6 Independent   Oral Hygiene 4 6 Independent   Toileting Hygiene 4 6 Independent   Shower/Bathe Self 4 6 Independent   Upper Body Dressing 4 6 Independent   Lower Body Dressing 3 6 Independent   Putting On/Taking Off Footwear 3 6 Independent       Mobility Functional Status:   Admission Assessment Current  Status  Discharge   Goal   Functional Area: Care Score:  Care Score:    Roll Left and Right 88 6 Independent   Sit to Lying 3 6 Independent   Lying to Sitting on Side of Bed 88 6 Independent   Sit to Stand 3 4 Independent   Chair/Bed-to-Chair Transfer 4 4 Independent   Toilet Transfer 4 6 Independent   Car Transfer 88 4 Independent   Walk 10 Feet 4 4  Independent   Walk 50 Feet with Two Turns 4 4 Independent   Walk 150 Feet 88 4 Independent   Walk 50 Feet with Two Turns 4 4 Independent   1 Step (Curb) 88 4 Independent   4 Steps 88 4 Independent   12 Steps 88 4 Supervision or touching assistance   Wheel 50 Feet with Two Turns         Wheel 150 Feet         Picking Up Object 88 6 Independent

## 2021-03-28 NOTE — PT Progress Note (Signed)
Physical Therapy  Inpatient Rehabilitation Discharge Summary    Patient Name:  [name] [name]       Medical Record Number: [id number]   Date of Birth: 17-Jan-1951  Sex: Female          Room/Bed:  M502/M502-01    Rehabilitation Precautions/Restrictions:  Weight Bearing Status: no restrictions  Back Brace Applied: OOB (TLSO)  Spinal Precautions: no bending, no twisting, no lifting  Other Precautions: Falls    Rehab Diagnosis: Lumbar compression fracture, closed, initial encounter [S32.000A]     History of Present Illness: Pt presented to [facility] after several lumbar endplate fractures.     Subjective   Patient Report: "I'm ready to get on that plane."  Patient/Caregiver Goals: Be in less pain and be able to move.  Pain Assessment:  Pain Assessment: Numeric Scale (0-10)  Pain Score: 7-severe pain  Pain Location: Back  Pain Orientation: Lower  Pain Descriptors: Aching  Pain Frequency: Increases with movement  Effect of Pain on Daily Activities: moderate  Pain Intervention(s): Medication (See eMAR), Cold applied    Objective    Vitals:  Heart Rate: 96  BP: 120/68  MAP (mmHg): 86    Interventions: PT performed caretool assessment of car transfer, curb and ramp, and stairs. All other CareTool is independent at usual function for last several days. PT then applied cold therapy to pt's lumbar region to reduce pain. PT and pt discussed pt's plane flight and airport negotiation.     Mobility Functional Status:   Current   Status Current Status Discharge Goal   Functional Area: Care Score:  Comments:    Roll Left and Right 6    Independent   Sit to Lying 6    Independent   Lying to Sitting on Side of Bed 6    Independent   Sit to Stand 6    Independent   Chair/Bed-to-Chair Transfer 6    Independent   Car Transfer 5    Independent   Walk 10 Feet 6    Independent   Walk 50 Feet with Two Turns 6    Independent   Walk 10 Feet on Uneven Surface 6 RW   Independent   Walk 150 Feet 6    Independent   1 Step (Curb) 6 rw   Independent    4 Steps 6 B rails   Independent   12 Steps 6 B rails   Supervision or touching assistance   Wheel 50 Feet with Two Turns          Wheel 150 Feet            Cognition: WFL    Encounter Problems       Encounter Problems (Resolved)       PT - General and Outcome Measures       LTG: Patient will adhere to ordered precautions 100% of time throughout mobility without verbal cues to ensure safety with all mobility tasks. (Completed)       Start: 03/17/21   Met: 03/28/21         LTG: Patient will complete all transfers with recommended DME, modified independent assistance in order to maximize independence and prevent falls. (Completed)       Start: 03/17/21   Met: 03/28/21         LTG: Patient will walk 150 ft with recommended DME, modified independent assistance in order to access areas of their home and community/decrease burden of care. (Completed)  Start: 03/17/21   Met: 03/28/21         LTG: Patient will ascend/descend two curb steps with recommended DME, modified independent assistance in order to safely and independently access their home/community. (Completed)       Start: 03/17/21   Met: 03/28/21                Education Documentation  Safety issues and interventions, taught by Aileen Fass, PT at 03/28/2021  5:52 PM.  Learner: Patient  Readiness: Acceptance  Method: Explanation  Response: Verbalizes Understanding    Rehab techniques/procedure, taught by Aileen Fass, PT at 03/28/2021  5:52 PM.  Learner: Patient  Readiness: Acceptance  Method: Explanation  Response: Verbalizes Understanding    Plan of care, taught by Aileen Fass, PT at 03/28/2021  5:52 PM.  Learner: Patient  Readiness: Acceptance  Method: Explanation  Response: Verbalizes Understanding    Orthotics/prosthetics, taught by Aileen Fass, PT at 03/28/2021  5:52 PM.  Learner: Patient  Readiness: Acceptance  Method: Explanation  Response: Verbalizes Understanding    Fall prevention/balance training, taught by Aileen Fass, PT  at 03/28/2021  5:52 PM.  Learner: Patient  Readiness: Acceptance  Method: Explanation  Response: Verbalizes Understanding    Increase physical activity, taught by Aileen Fass, PT at 03/28/2021  5:52 PM.  Learner: Patient  Readiness: Acceptance  Method: Explanation  Response: Verbalizes Understanding    Education Comments  No comments found.        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 She has progerssed well during her stay, achieving independence with all mobility using a RW. She is also able to manage her brace. She will benefit from home health therapy next to address any unforseen difficulty with returning home for convalescence as well as improving her activity tolerance and discharging assistive devices to return to her usual mobility level prior to this episode of care.     Plan:  Risks/Benefits/POC Discussed with Pt/Family: With patient  Treatment/Interventions: Exercise, Gait training, Stair training, Neuromuscular re-education, Functional transfer training, LE strengthening/ROM, Endurance training, Patient/family training, Equipment eval/education    Recommendation:  Discharge Recommendation: Home with home health PT  DME Recommended for Discharge: Front wheel walker  PT Therapy Recommendation: 5-6 days/week, 60-120 mins/day, 1:1 treatment  PT Estimated Length of Stay: 10 days from IE on 03/17/21

## 2021-03-28 NOTE — Discharge Instructions (Signed)
Home Health Discharge Information     Your doctor has ordered Skilled Nursing, Physical Therapy, Occupational Therapy, and Home Health Aide in-home service(s) for you while you recuperate at home, to assist you in the transition from hospital to home.      The agency that you or your representative chose to provide the service:  Name of Home Health Agency Placement: [facility](formerly [facility])  [phone]    The above services were set up by:  Romualdo Bolk, RN  [facility] Liaison)   Phone      [phone]                                Additional comments:   If you have not heard from your home health agency within 24-48 hours after discharge please call your agency to arrange a time for your first visit.  For any scheduling concerns or questions related to home health, such as time or date please contact your home health agency at the number listed above.

## 2021-03-29 LAB — CBC
Absolute NRBC: 0 10*3/uL (ref 0.00–0.00)
Hematocrit: 37.9 % (ref 34.7–43.7)
Hgb: 12.4 g/dL (ref 11.4–14.8)
MCH: 31.8 pg (ref 25.1–33.5)
MCHC: 32.7 g/dL (ref 31.5–35.8)
MCV: 97.2 fL — ABNORMAL HIGH (ref 78.0–96.0)
MPV: 11.2 fL (ref 8.9–12.5)
Nucleated RBC: 0 /100 WBC (ref 0.0–0.0)
Platelets: 348 10*3/uL — ABNORMAL HIGH (ref 142–346)
RBC: 3.9 10*6/uL (ref 3.90–5.10)
RDW: 13 % (ref 11–15)
WBC: 6.43 10*3/uL (ref 3.10–9.50)

## 2021-03-29 LAB — COMPREHENSIVE METABOLIC PANEL
ALT: 11 U/L (ref 0–55)
AST (SGOT): 11 U/L (ref 5–34)
Albumin/Globulin Ratio: 1.5 (ref 0.9–2.2)
Albumin: 3.4 g/dL — ABNORMAL LOW (ref 3.5–5.0)
Alkaline Phosphatase: 125 U/L — ABNORMAL HIGH (ref 37–117)
Anion Gap: 7 (ref 5.0–15.0)
BUN: 7 mg/dL (ref 7.0–19.0)
Bilirubin, Total: 0.6 mg/dL (ref 0.2–1.2)
CO2: 27 mEq/L (ref 22–29)
Calcium: 8.7 mg/dL (ref 8.5–10.5)
Chloride: 98 mEq/L — ABNORMAL LOW (ref 100–111)
Creatinine: 0.6 mg/dL (ref 0.6–1.0)
Globulin: 2.3 g/dL (ref 2.0–3.6)
Glucose: 111 mg/dL — ABNORMAL HIGH (ref 70–100)
Potassium: 4.5 mEq/L (ref 3.5–5.1)
Protein, Total: 5.7 g/dL — ABNORMAL LOW (ref 6.0–8.3)
Sodium: 132 mEq/L — ABNORMAL LOW (ref 136–145)

## 2021-03-29 LAB — GLUCOSE WHOLE BLOOD - POCT: Whole Blood Glucose POCT: 131 mg/dL — ABNORMAL HIGH (ref 70–100)

## 2021-03-29 LAB — GFR: EGFR: >60

## 2021-03-29 MED ORDER — SODIUM CHLORIDE 1 G PO TABS
1.0000 g | ORAL_TABLET | Freq: Three times a day (TID) | ORAL | Status: AC
Start: 2021-03-29 — End: ?

## 2021-03-29 NOTE — Progress Notes (Signed)
Pt was discharged home today. Explained discharge medications. Pt/family member [employer]. Pt left by wheelchair at 1020.

## 2021-03-29 NOTE — Plan of Care (Signed)
Problem: Bladder Management  Goal: LTG: Patient will be able to void spontaneously with PVR of less than 100 cc.   Outcome: Adequate for Discharge     Problem: Endocrine  Goal: LTG: Prior D/C patient will demonstrate the ability to self manage diabetes at home and what to do when symptoms arise.  Outcome: Adequate for Discharge     Problem: Pain Management  Goal: LTG: Pain will verbalized adequate pain control with scheduled/ current pain medication prior to D/C.  Outcome: Adequate for Discharge     Problem: Safety Risk  Goal: LTG: Patient will call for assistance for out of bed activities and remain free from falls through hospitalization.  Outcome: Adequate for Discharge

## 2021-03-29 NOTE — Plan of Care (Deleted)
Patient is currently blue, D/C today,C/O back pain, managed with scheduled pain medication, Bed time BS managed without coverage,  BP stable, slept well. Happy to go home.    Problem: Endocrine  Goal: LTG: Prior D/C patient will demonstrate the ability to self manage diabetes at home and what to do when symptoms arise.  Outcome: Progressing  managed     Problem: Pain Management  Goal: LTG: Pain will verbalized adequate pain control with scheduled/ current pain medication prior to D/C.  Outcome: Progressing  managed     Problem: Safety Risk  Goal: LTG: Patient will call for assistance for out of bed activities and remain free from falls through hospitalization.  Outcome: Progressing   Met, currently blue     Problem: Bladder Management  Goal: LTG: Patient will be able to void spontaneously with PVR of less than 100 cc.   Outcome: Progressing   Continent at this time.

## 2021-03-29 NOTE — Plan of Care (Signed)
Patient is currently blue, D/C today,C/O back pain, managed with scheduled pain medication, Bed time BS managed without coverage,  BP stable, slept well. Happy to go home.    Problem: Bladder Management  Goal: LTG: Patient will be able to void spontaneously with PVR of less than 100 cc.   03/29/2021 0247 by Jerl Santos, RN  Outcome: Adequate for Discharge     Problem: Endocrine  Goal: LTG: Prior D/C patient will demonstrate the ability to self manage diabetes at home and what to do when symptoms arise.  03/29/2021 0247 by Jerl Santos, RN  Outcome: Adequate for Discharge     Problem: Pain Management  Goal: LTG: Pain will verbalized adequate pain control with scheduled/ current pain medication prior to D/C.  03/29/2021 0247 by Jerl Santos, RN  Outcome: Adequate for Discharge    Problem: Safety Risk  Goal: LTG: Patient will call for assistance for out of bed activities and remain free from falls through hospitalization.  03/29/2021 0247 by Jerl Santos, RN  Outcome: Adequate for Discharge

## 2021-03-31 NOTE — Retrospective Coding Query (Signed)
PHYSICIAN'S DOCUMENTATION                                                                      REQUEST                                                                         Date of Request:  03/31/2021  Type of Request:  DOCUMENTATION CLARIFICATION                                         Patient Name: Darci, Denaro  Account #: [id number]  MR #: [id number]  Discharge Date: 03/29/2021      Dear Dr. Selena Batten, Carma Dwiggins HUN     The medical record reflects the following 70 year old presented with T11 and L1 fractures and also had a fall in April with a lumbar strain and continued pain. The fractures were stated as compression and osteopenic. Per the H&P, MRI showed degenerative changes in the lumbar spine and T10, T11, L1, and L2 had abnormal edema.     H&P Patient mentioned that she incurred an injury to her back when she tries to pick up a heavy suitcase sometime in April. She was diagnosed with Lumbar sprain by then.     The findings could represent multiple acute osteopenic compression fractures. Although less likely pathologic fractures cannot be excluded, especially involving the L1 vertebral body.     REQUEST TO PHYSICIAN:     Please clarify the etiology of the fractures:                 __ Osteopenia              __ Osteoporosis              __ Pathologic              __ Traumatic              __ Collapsed vertebra  __ Other, please specify __________________  _x_ Unable to determine        PHYSICIAN RESPONSE:     Unable to determine.  Chung Chagoya Virl Son, MD     [facility]  If you need assistance or have questions,  please contact me at [email or web address].        Date     03/30/2021

## 2021-05-15 IMAGING — MR MRI LSPINE WO/W CONTRAST
9 of 10 series · 39 of 48 positions shown · IV contrast (prohance)
Comparison: MRI lumbar spine without and with contrast 03/14/2019

HISTORY: 70-year-old female.  Age-related osteoporosis with current pathological fracture
TECHNIQUE: Multiplanar, multisequence MR images of the lumbar spine were obtained before and after administration of intravenous contrast. 

CONTRAST: 15 cc of ProHance

[Series 1: bSSFP · axial · 8.0mm · 1.37mm/px · z∈[-61,+175]mm · 4 of 25 slices shown (1 of 2)]
[im 1/25]
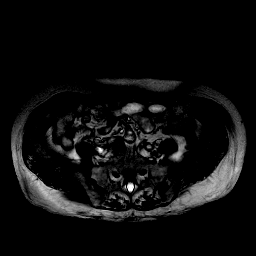
[im 9/25]
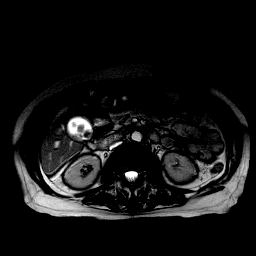
[im 17/25]
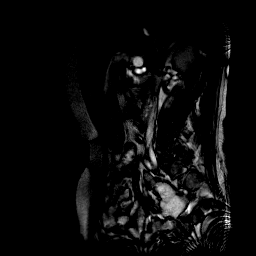
[im 25/25]
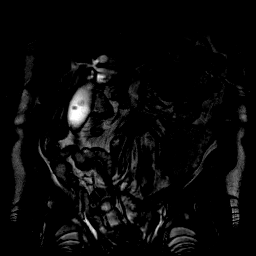

[Series 2: t2_sag · sagittal · 4.0mm · 0.38mm/px · 3 of 14 slices shown]
[im 1/14]
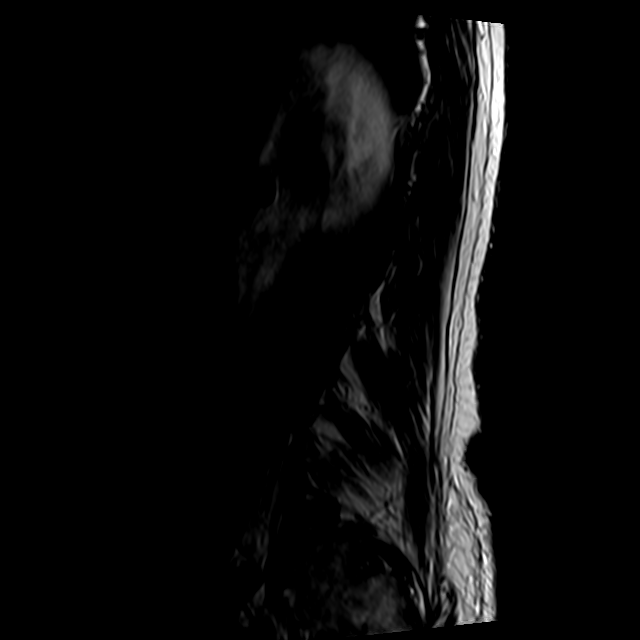
[im 7/14]
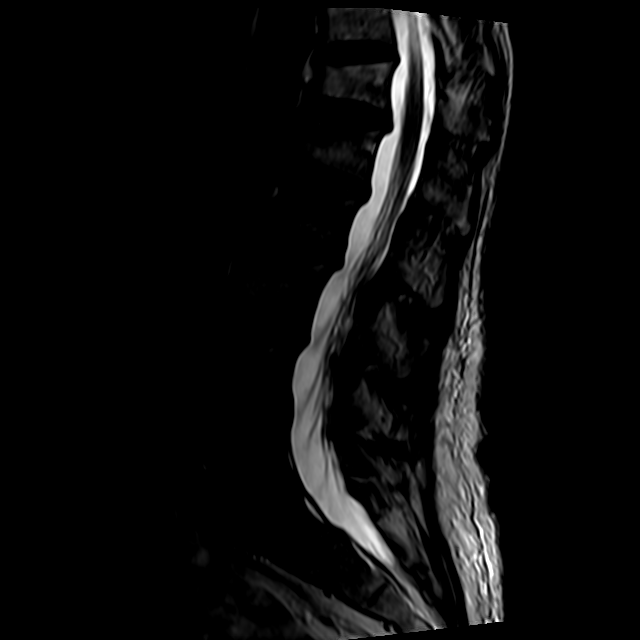
[im 14/14]
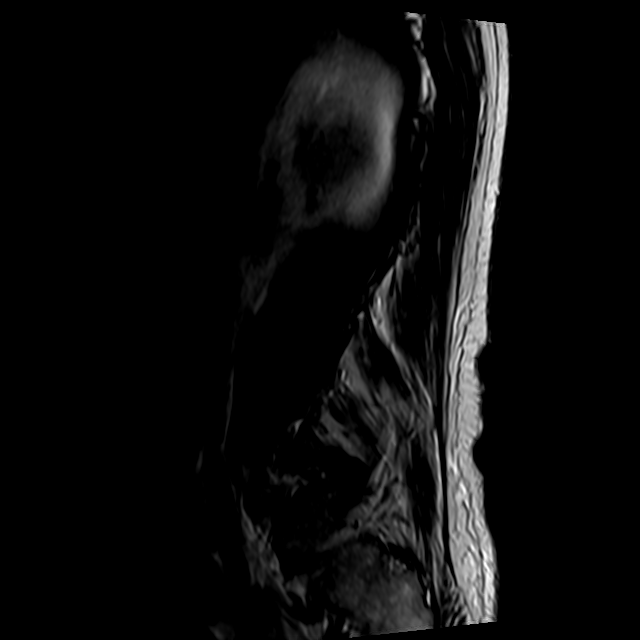

[Series 3: t1_sag · sagittal · 4.0mm · 0.75mm/px · 3 of 14 slices shown]
[im 1/14]
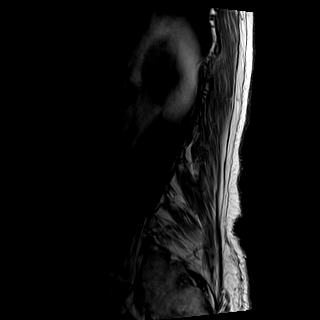
[im 7/14]
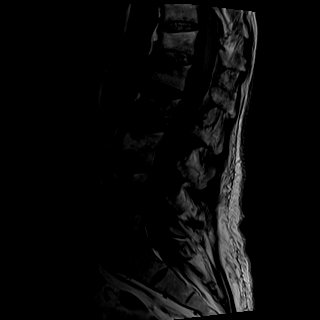
[im 14/14]
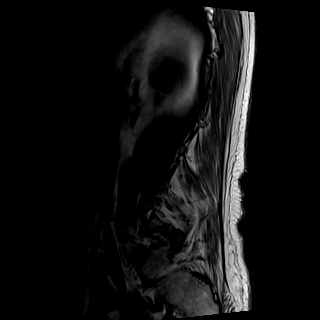

[Series 4: ir_sag · sagittal · 4.0mm · 0.47mm/px · 3 of 14 slices shown]
[im 1/14]
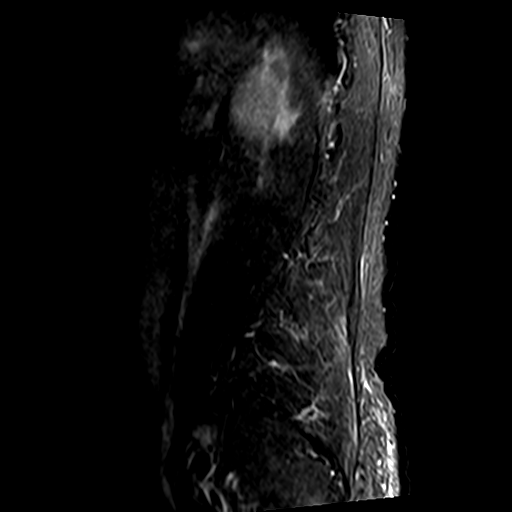
[im 7/14]
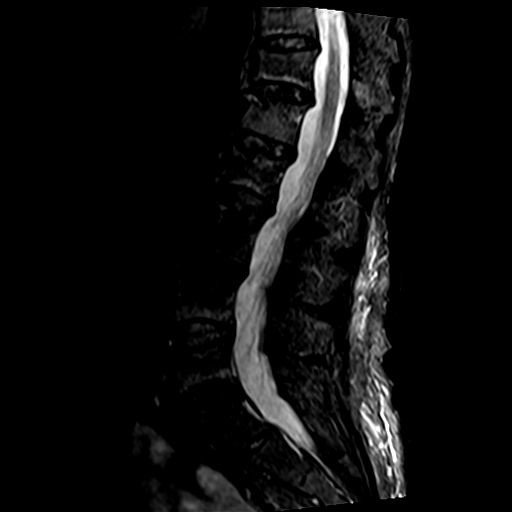
[im 14/14]
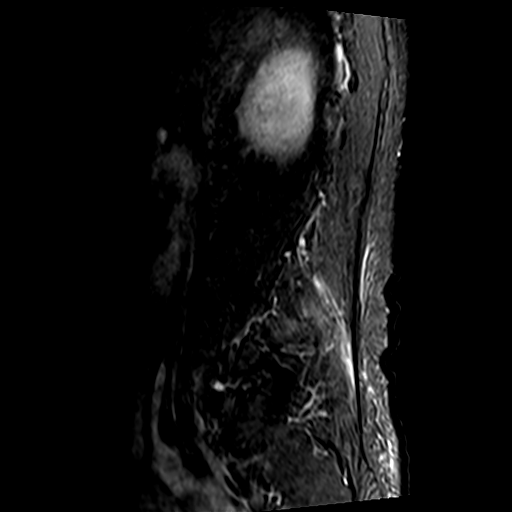

[Series 5: t1_axial_obl · axial · 4.0mm · 0.51mm/px · z∈[-71,+123]mm · 6 of 30 slices shown]
[im 1/30]
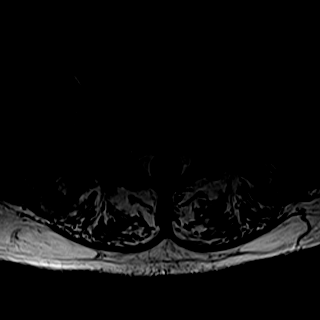
[im 6/30]
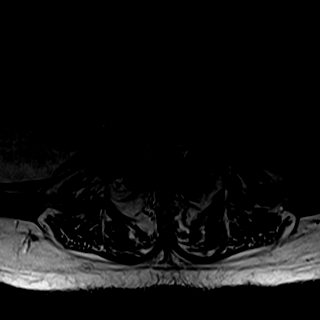
[im 12/30]
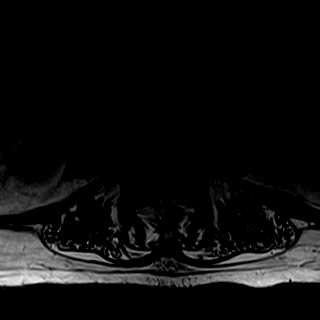
[im 18/30]
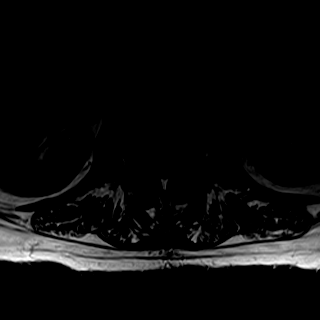
[im 24/30]
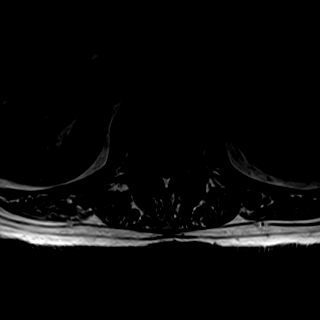
[im 30/30]
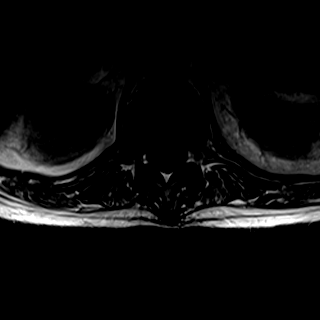

[Series 6: t2_axial · axial · 4.0mm · 0.64mm/px · z∈[-66,+128]mm · 7 of 36 slices shown]
[im 1/36]
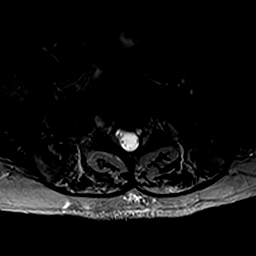
[im 6/36]
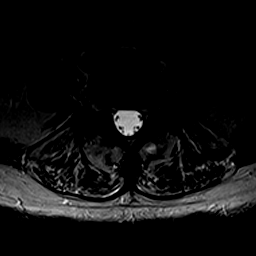
[im 12/36]
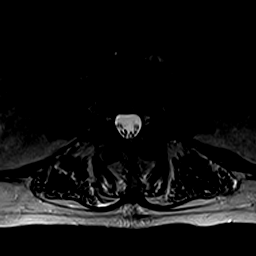
[im 18/36]
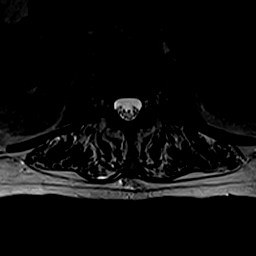
[im 24/36]
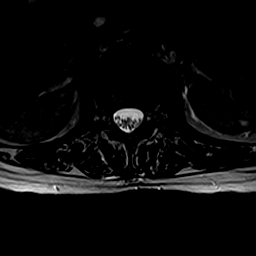
[im 30/36]
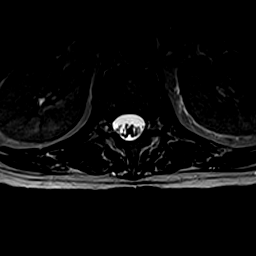
[im 36/36]
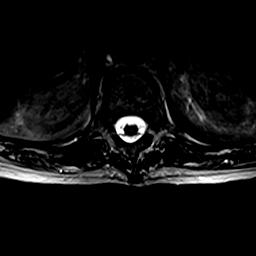

[Series 7: t1_axial_fs · axial · 4.0mm · 0.66mm/px · z∈[-52,+122]mm · 7 of 36 slices shown]
[im 1/36]
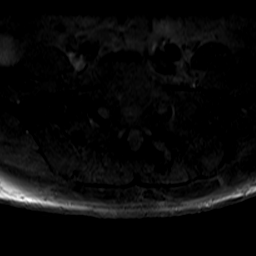
[im 6/36]
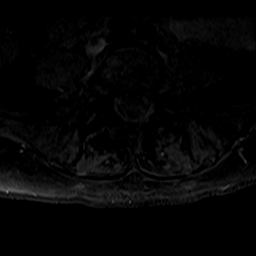
[im 12/36]
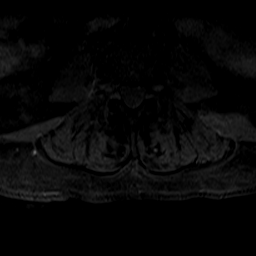
[im 18/36]
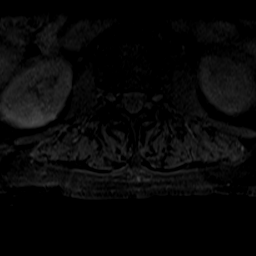
[im 24/36]
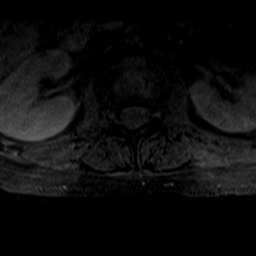
[im 30/36]
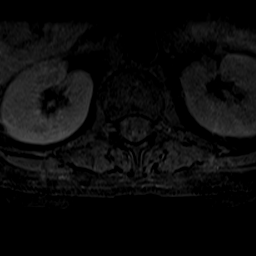
[im 36/36]
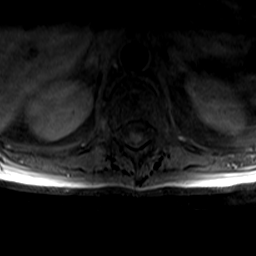

[Series 8: bSSFP · axial · 8.0mm · 1.37mm/px · z∈[-61,+175]mm · 5 of 25 slices shown (2 of 2)]
[im 1/25]
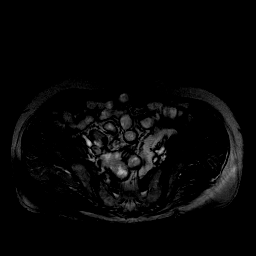
[im 7/25]
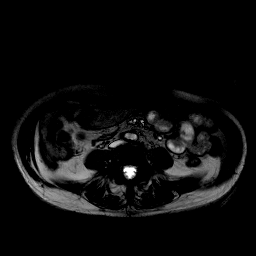
[im 13/25]
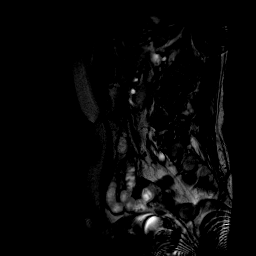
[im 19/25]
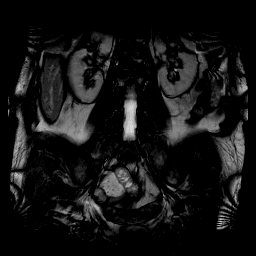
[im 25/25]
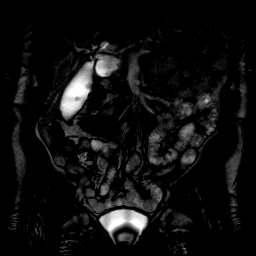

[Series 9: t1_sag_+c_fs · sagittal · 4.0mm · 0.75mm/px · 1 of 14 slices shown]
[im 1/14]
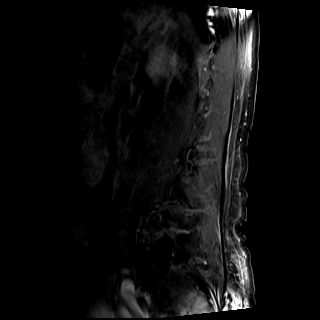

[39 of 48 positions shown; findings below may reference images not displayed]

FINDINGS: COUNT/LABELING: Please note that spinal labeling was performed assuming there are 5 non-rib-bearing lumbar-type vertebrae.

ALIGNMENT: No subluxations.

VERTEBRAE: Chronic compression fracture at L4. Redemonstration of compression fractures at T12-L2.  Significantly decreased marrow edema and enhancement compared to previous MRI on 03/13/2021. Multilevel degenerative endplate signal changes.

INTERVERTEBRAL DISCS:  Multilevel disc desiccation without height loss.

FACET JOINTS:  Multilevel facet arthropathy, moderate at L1-2 through L5-S1.

PARASPINAL SOFT TISSUES:  No significant paraspinal soft tissue signal modalities.

DISTAL THORACIC SPINAL CORD AND CONUS MEDULLARIS:  The distal thoracic spinal cord is normal in caliber and signal with the conus medullaris terminating at L1-2, which is normal.

CAUDA EQUINA NERVE ROOTS: Unremarkable.

FINDINGS BY LEVEL:

T12-L1: No high-grade canal stenosis or foraminal narrowing.

L1-L2:  No high-grade canal stenosis or foraminal narrowing.

L2-L3:  No high-grade canal stenosis or foraminal narrowing.

L3-L4:  No high-grade canal stenosis or foraminal narrowing. 

L4-L5:  No high-grade canal stenosis or foraminal narrowing.

L5-S1:  Disc bulge, facet arthropathy, and endplate spurring. Moderate left foraminal narrowing. No high-grade right foraminal narrowing or canal stenosis.

OTHER: Incidental note of partially visualized gallstones.
IMPRESSION: 1.
Compression fractures at T12-L2. Significantly decreased marrow edema and enhancement since previous imaging on 03/13/2021. Chronic compression fracture at L4. No new fractures identified.

2.
Moderate left foraminal narrowing.

3.
Incidental note of cholelithiasis.

STAT FAX

## 2021-05-15 IMAGING — MR MRI TSPINE WO/W CONTRAST
10 of 11 series · 39 of 48 positions shown · IV contrast (15CC PROHANCE)
Comparison: None.

HISTORY: 70-year-old female. Age-related osteoporosis with current pathological fracture of vertebra. Patient reports history of breast and lung cancer.
TECHNIQUE: Multiplanar, multisequence MR images of the thoracic spine were obtained before and after administration of intravenous contrast.  

CONTRAST: 15 cc of ProHance.

[Series 2: t2_sag_count_loc · sagittal · 4.0mm · 0.68mm/px · 1 of 9 slices shown]
[im 1/9]
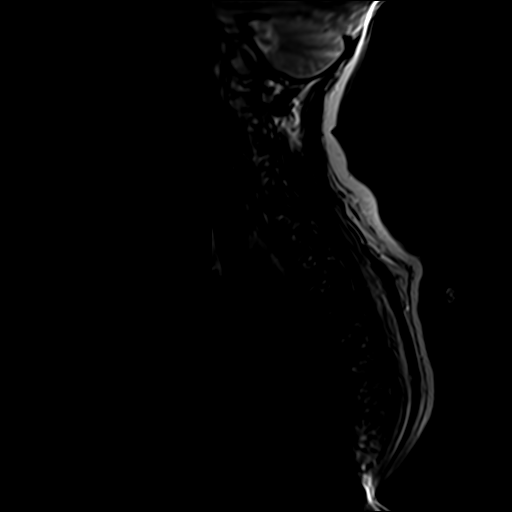

[Series 3: t2_cor_loc · coronal · 8.0mm · 0.68mm/px · 2 of 9 slices shown]
[im 1/9]
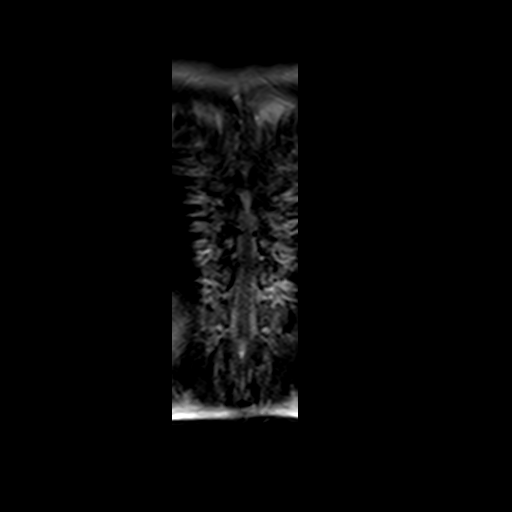
[im 9/9]
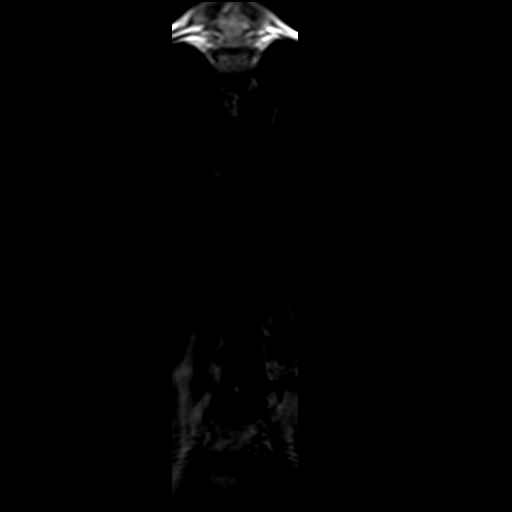

[Series 5: t2_sag · sagittal · 4.0mm · 0.78mm/px · 3 of 14 slices shown]
[im 1/14]
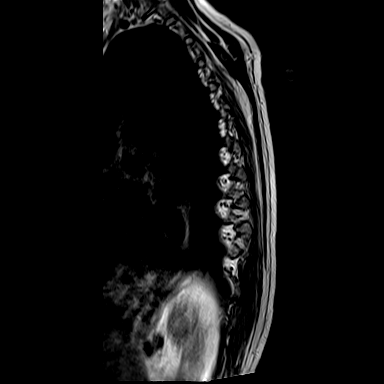
[im 7/14]
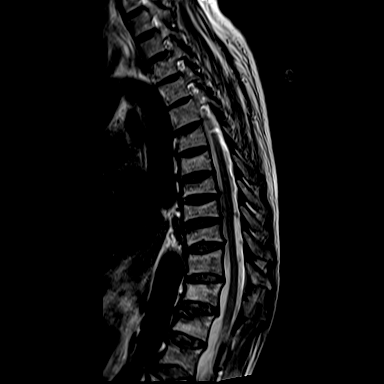
[im 14/14]
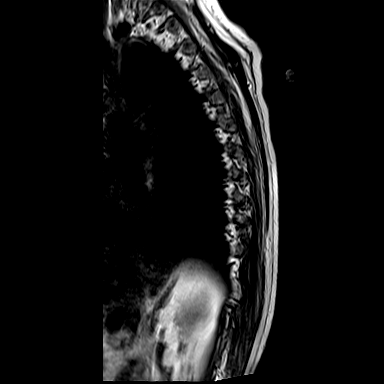

[Series 6: t1_sag · sagittal · 4.0mm · 0.94mm/px · 3 of 14 slices shown]
[im 1/14]
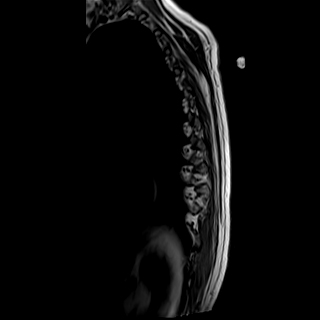
[im 7/14]
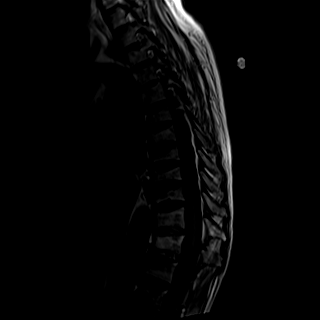
[im 14/14]
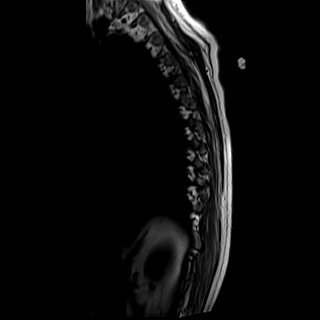

[Series 7: ir_sag · sagittal · 4.0mm · 1.17mm/px · 3 of 14 slices shown]
[im 1/14]
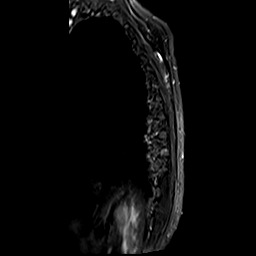
[im 7/14]
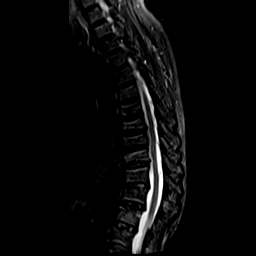
[im 14/14]
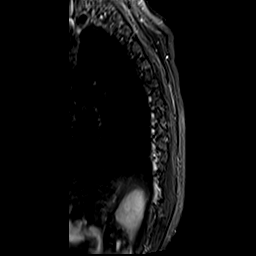

[Series 8: t2_axial_upper · axial · 4.0mm · 0.45mm/px · z∈[-171,-74]mm · 4 of 20 slices shown]
[im 1/20]
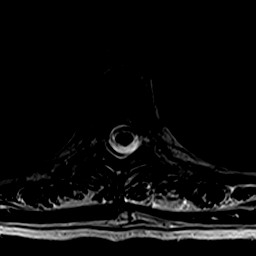
[im 7/20]
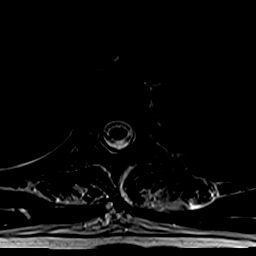
[im 13/20]
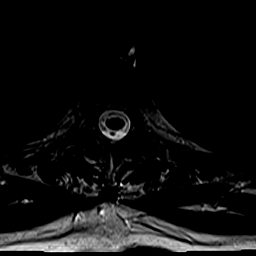
[im 20/20]
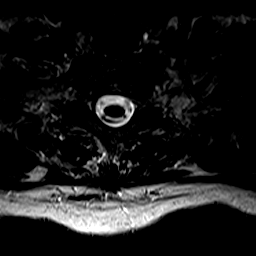

[Series 9: t2_axial_lower · axial · 4.0mm · 0.29mm/px · z∈[-300,-157]mm · 5 of 28 slices shown]
[im 1/28]
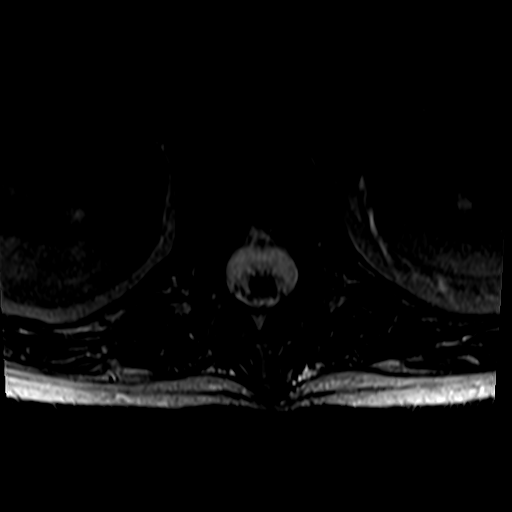
[im 7/28]
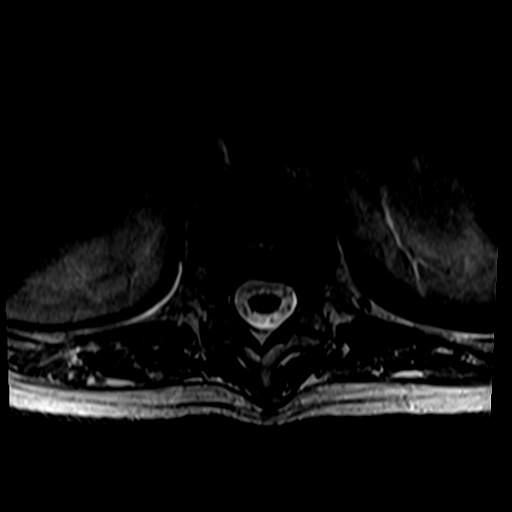
[im 14/28]
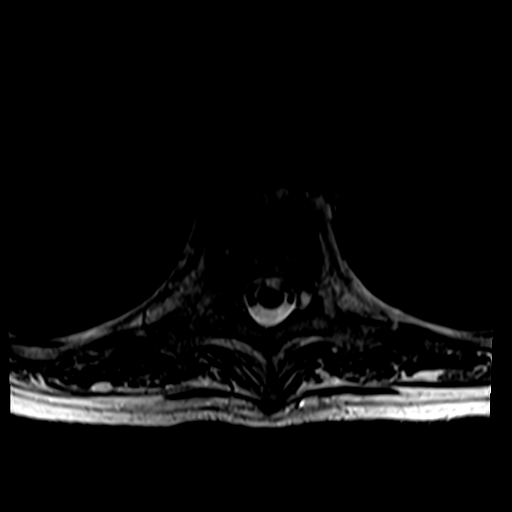
[im 21/28]
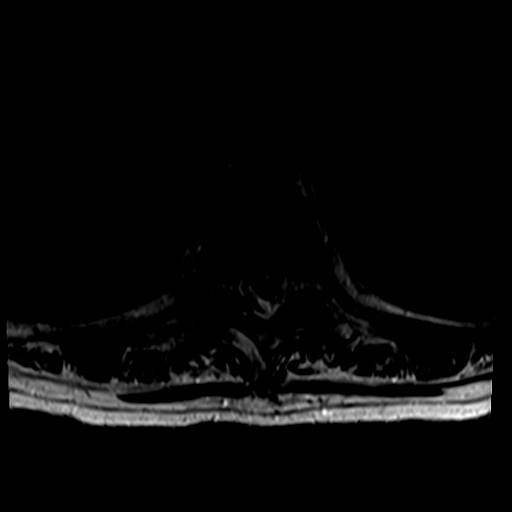
[im 28/28]
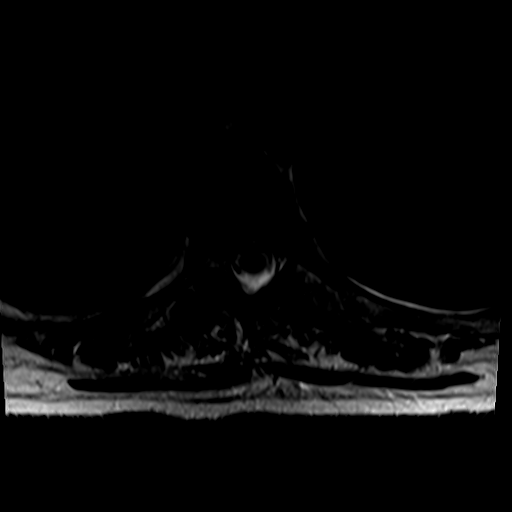

[Series 10: t1_axial_fs · axial · 4.0mm · 0.53mm/px · z∈[-291,-68]mm · 8 of 43 slices shown]
[im 1/43]
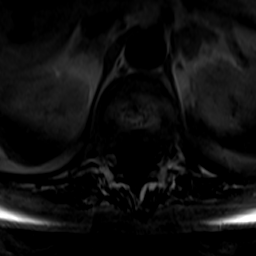
[im 7/43]
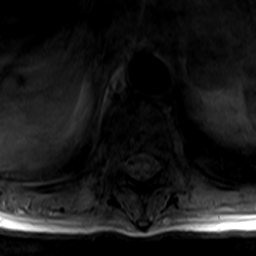
[im 13/43]
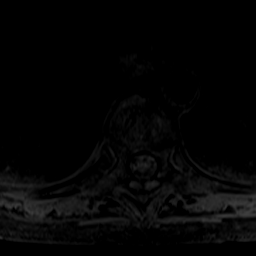
[im 19/43]
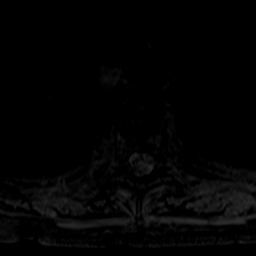
[im 25/43]
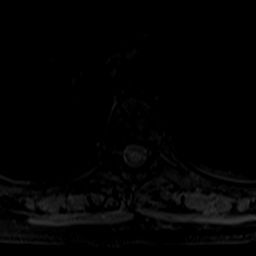
[im 31/43]
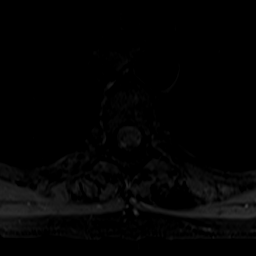
[im 37/43]
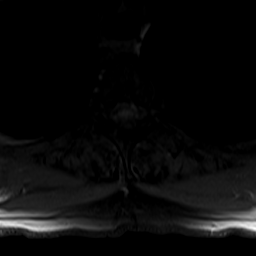
[im 43/43]
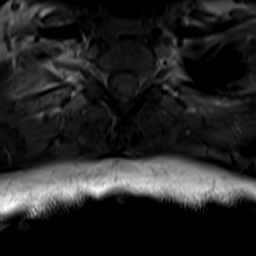

[Series 11: t1_axial_fs_+c · axial · 4.0mm · 0.53mm/px · z∈[-291,-68]mm · 8 of 43 slices shown]
[im 1/43]
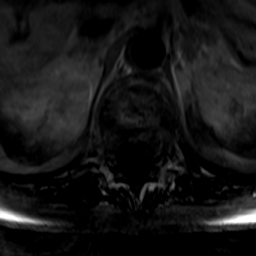
[im 7/43]
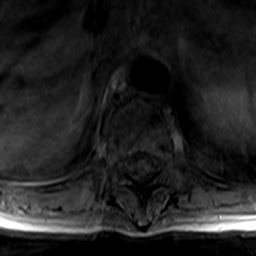
[im 13/43]
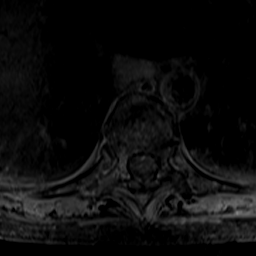
[im 19/43]
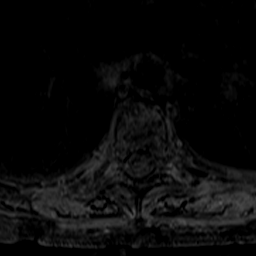
[im 25/43]
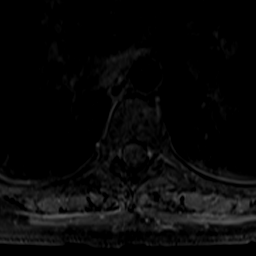
[im 31/43]
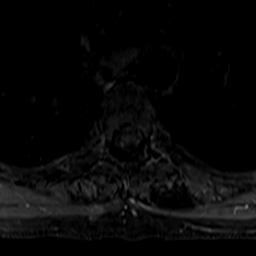
[im 37/43]
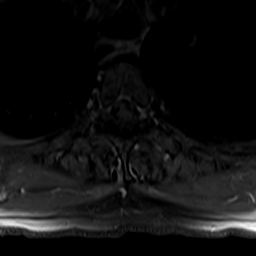
[im 43/43]
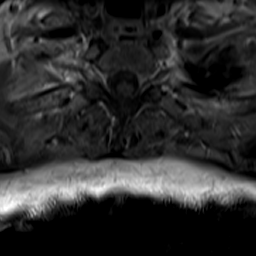

[Series 12: axial_sub · axial · 4.0mm · 0.53mm/px · z∈[-291,-259]mm · 2 of 43 slices shown]
[im 1/43]
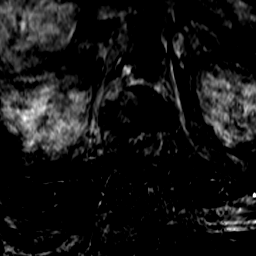
[im 7/43]
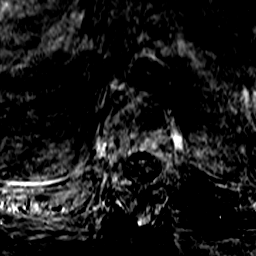

[39 of 48 positions shown; findings below may reference images not displayed]

FINDINGS: COUNT/LABELING: Please note that spinal labeling was performed assuming there are 12 rib-bearing thoracic vertebrae.

ALIGNMENT: No subluxations.

VERTEBRAE: Multiple compression fractures are noted including T2, T6-L2. Mild endplate edema is noted at T7-L2, which may suggest subacute chronicity. No significant paraspinal edema.

INTERVERTEBRAL DISCS: Multilevel disc desiccation without height loss.

PARASPINAL SOFT TISSUES: No paraspinal soft tissue abnormalities.

SPINAL CORD: The thoracic spinal cord is normal in caliber and signal.

FINDINGS BY LEVEL:

T1-2: No high-grade canal stenosis or foraminal narrowing.

T2-3: No high-grade canal stenosis or foraminal narrowing.

T3-4: No high-grade canal stenosis or foraminal narrowing.

T4-5: No high-grade canal stenosis or foraminal narrowing.

T5-6: No high-grade canal stenosis or foraminal narrowing.

T6-7: No high-grade canal stenosis or foraminal narrowing.

T7-8: Disc bulge facet arthropathy, and endplate spurring. Moderate bilateral foraminal narrowing.

T8-9: No high-grade canal stenosis or foraminal narrowing.

T9-10: Disc bulge facet facet arthropathy, and endplate spurring. Moderate right foraminal narrowing. No high-grade left foraminal narrowing or canal stenosis.

T10-11: No high-grade canal stenosis or foraminal narrowing.

T11-12: No high-grade canal stenosis or foraminal narrowing.

T12-L1: No high-grade canal stenosis or foraminal narrowing.
IMPRESSION: 1.
Multiple compression fractures including T2, T6-L2. Mild endplate edema is noted at T7-L2, which may suggest subacute chronicity. No paraspinal soft tissue edema.

2.
Moderate bilateral T7-8 and right T9-10 foraminal narrowing.

STAT FAX

## 2021-05-22 IMAGING — US US RENAL RETRO COMPLETE
1 series · 14 of 25 positions shown · non-contrast
Comparison: CT chest, abdomen and pelvis with IV contrast, 12/11/2020.

HISTORY: Disorder of kidney and ureter.
TECHNIQUE: High-resolution grayscale renal ultrasound with color-flow Doppler.

[Series 1: us renal retro complete · 14 of 38 slices shown]
[im 1/38]
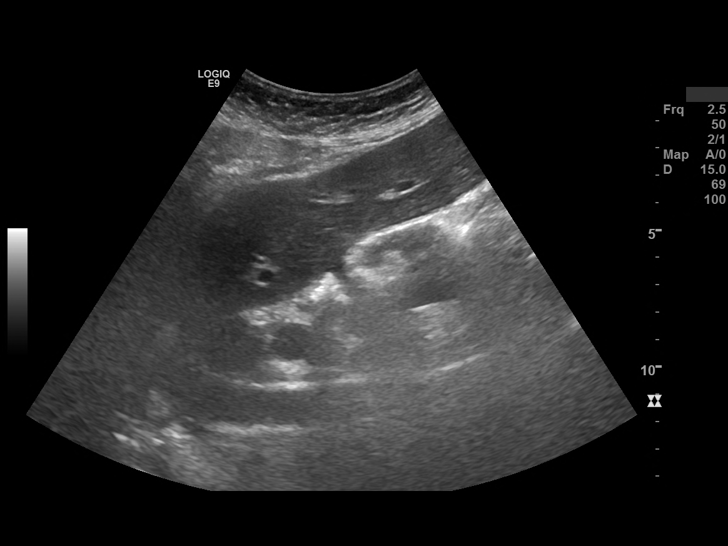
[im 4/38]
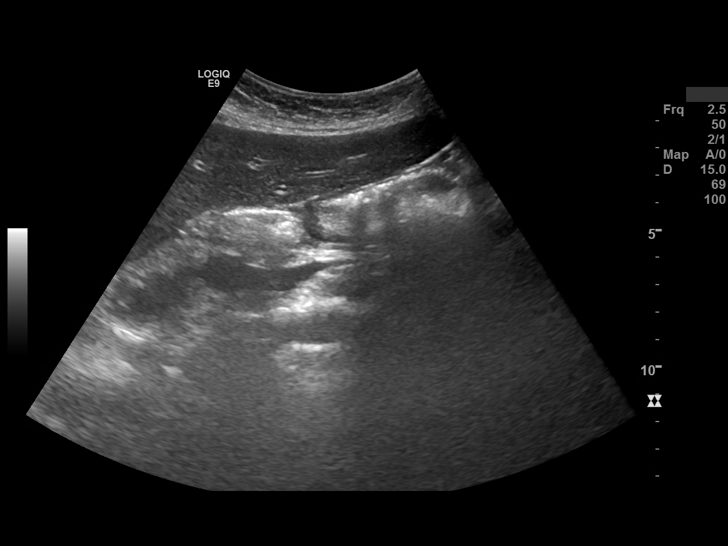
[im 7/38]
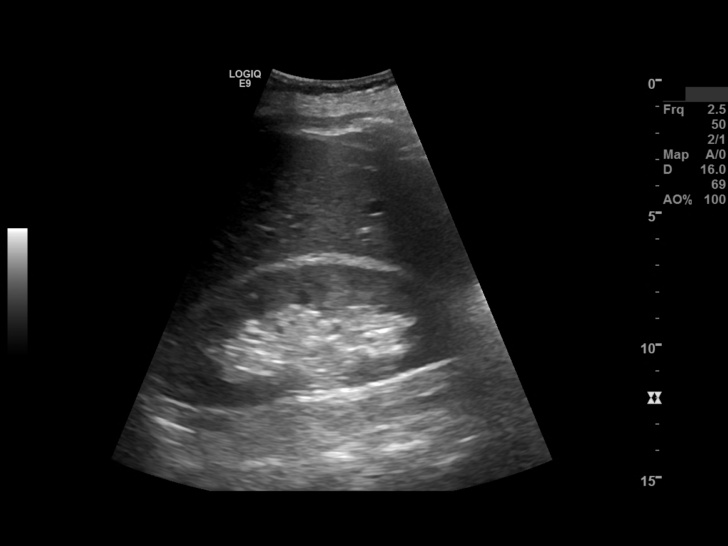
[im 10/38]
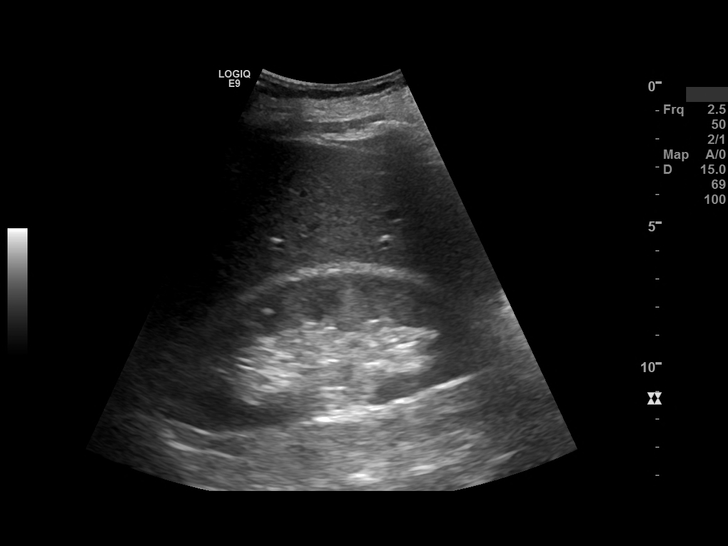
[im 13/38]
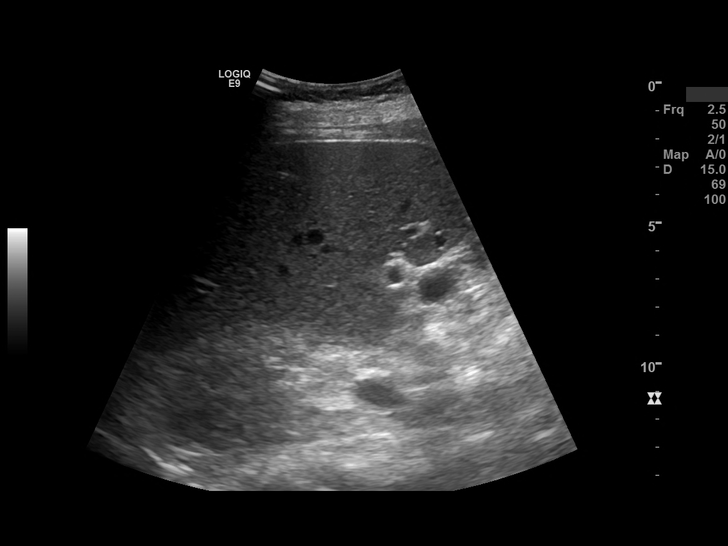
[im 14/38]
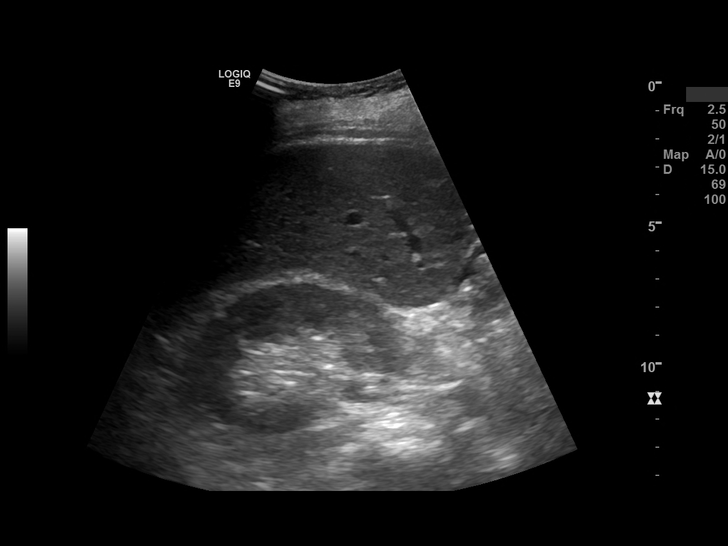
[im 17/38]
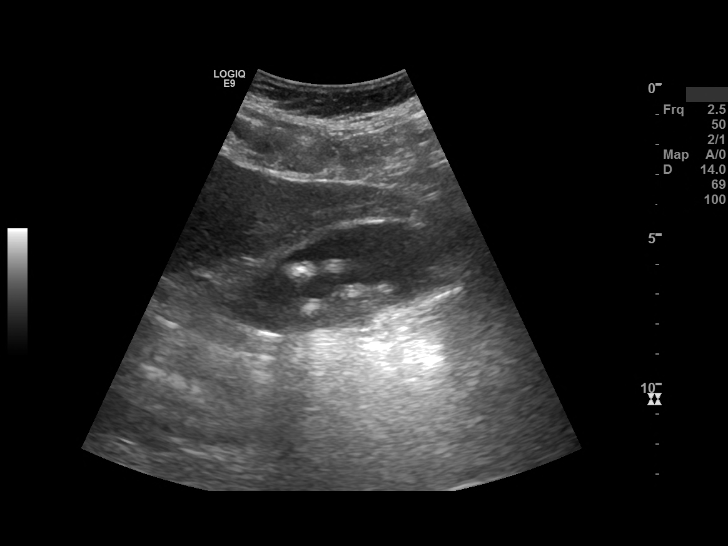
[im 21/38]
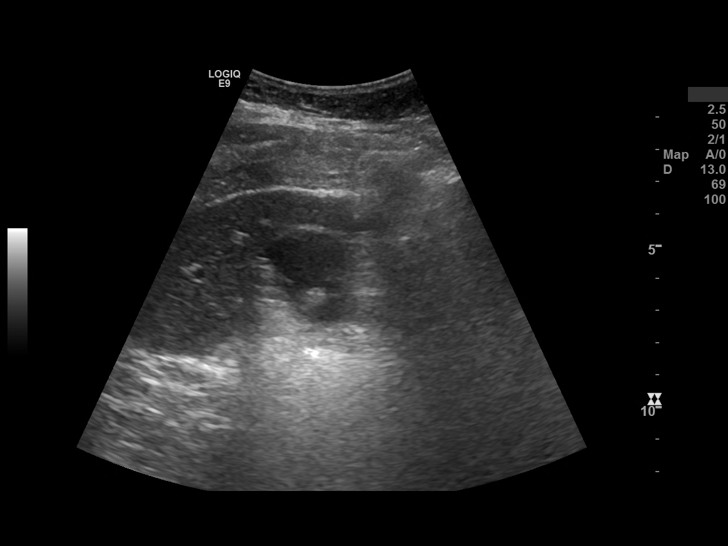
[im 24/38]
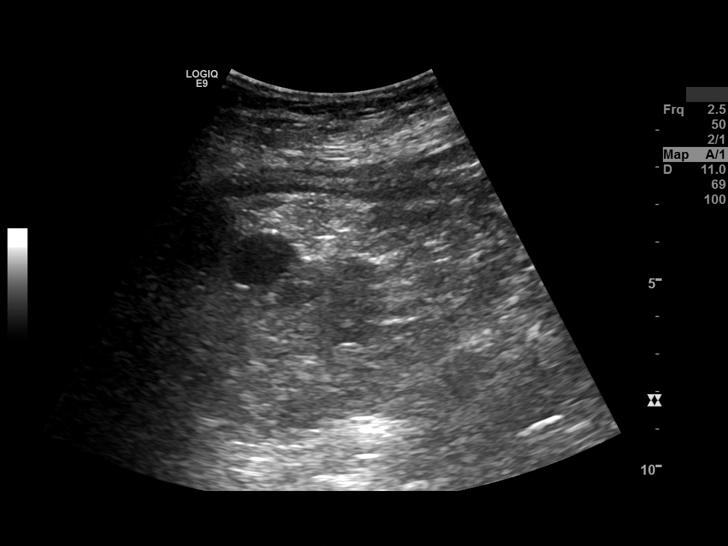
[im 25/38]
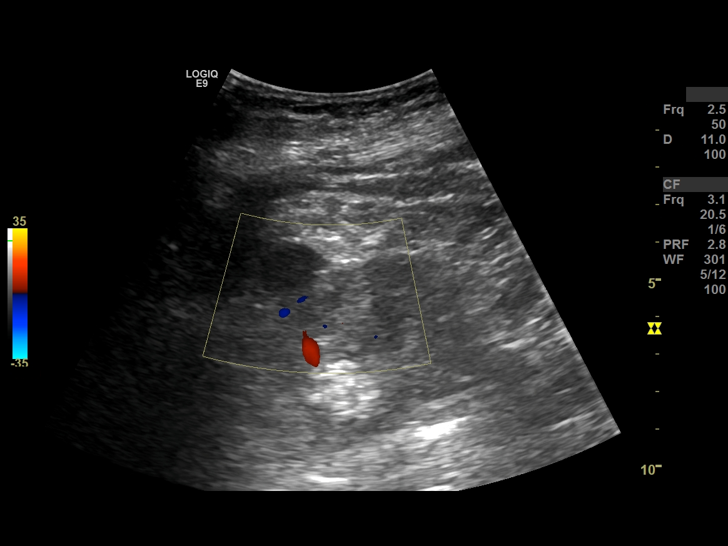
[im 28/38]
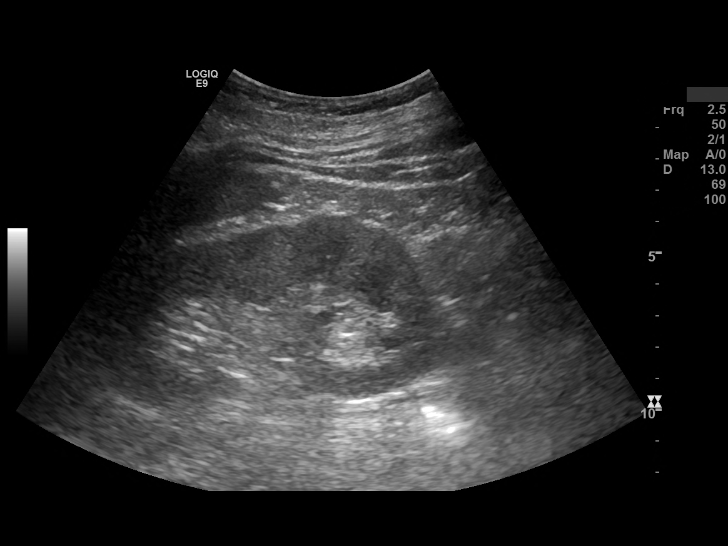
[im 31/38]
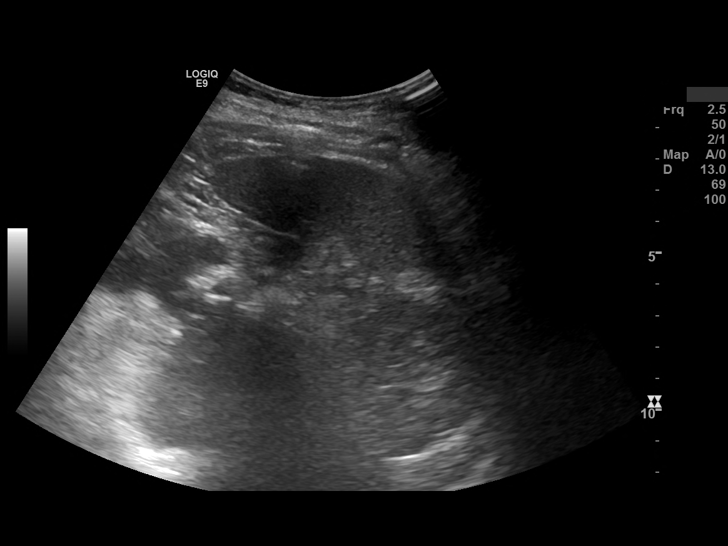
[im 34/38]
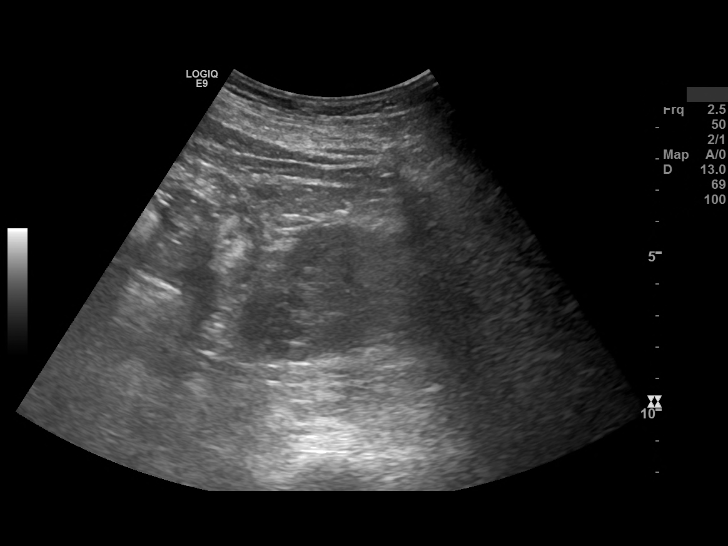
[im 38/38]
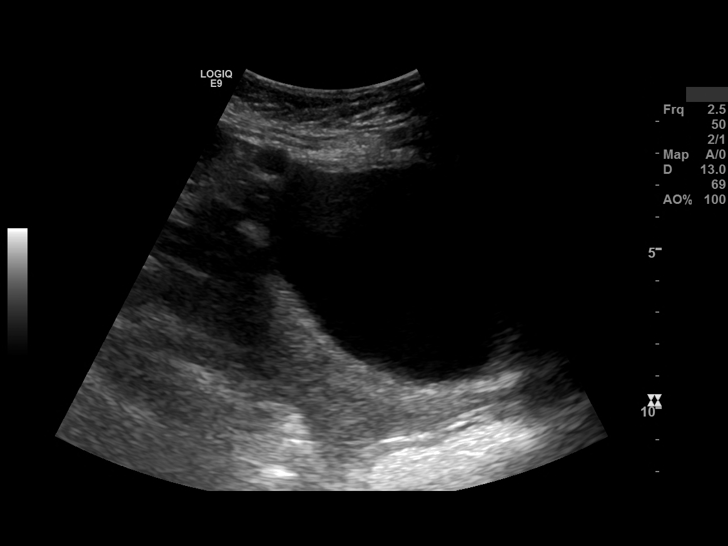

[14 of 25 positions shown; findings below may reference images not displayed]

FINDINGS: Right kidney: 119 x 50 x 69 mm. Cortical thickness 15 mm. There is no stone, mass or hydronephrosis.

Left kidney: 111 x 58 x 65 mm. Cortical thickness 15 mm. There is a mid polar simple cyst 21 x 15 x 19 mm. There is no solid mass or hydronephrosis.

Urinary bladder: Unremarkable.

Aorta: The proximal and distal aorta are normal in caliber. No evidence of aneurysm. The common iliac arteries are not seen due to bowel gas.

IVC: Patent. Unremarkable.

Gallbladder: As an incidental finding, the gallbladder is visualized and contains multiple stones.
IMPRESSION: 1. Cholelithiasis.

2. Mid polar simple cyst, left kidney.

3. Normal bladder.

## 2021-07-16 IMAGING — OT DXA BONE DENSITY
3 series · 3 of 3 positions shown · non-contrast
Comparison: none

REASON FOR EXAM: Postmenopausal osteoporosis screening.

[Series 1: — · left · 1 of 1 slices shown (1 of 3)]
[im 1/1]
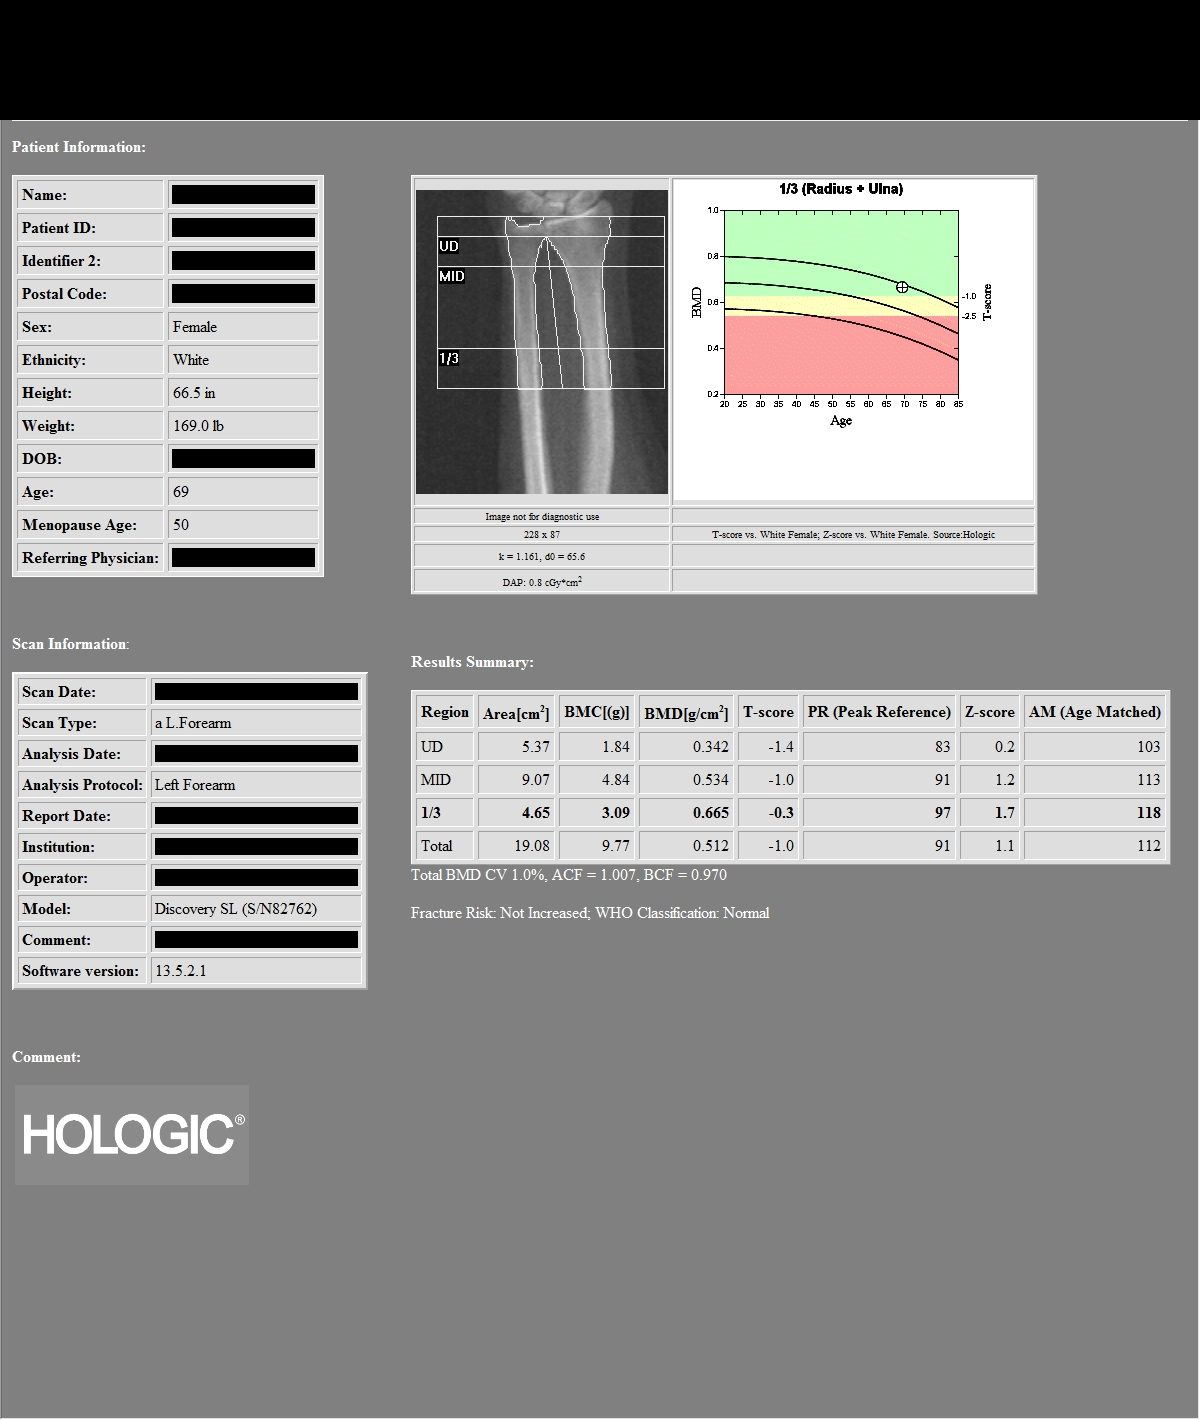

[Series 2: — · 1 of 1 slices shown (2 of 3)]
[im 1/1]
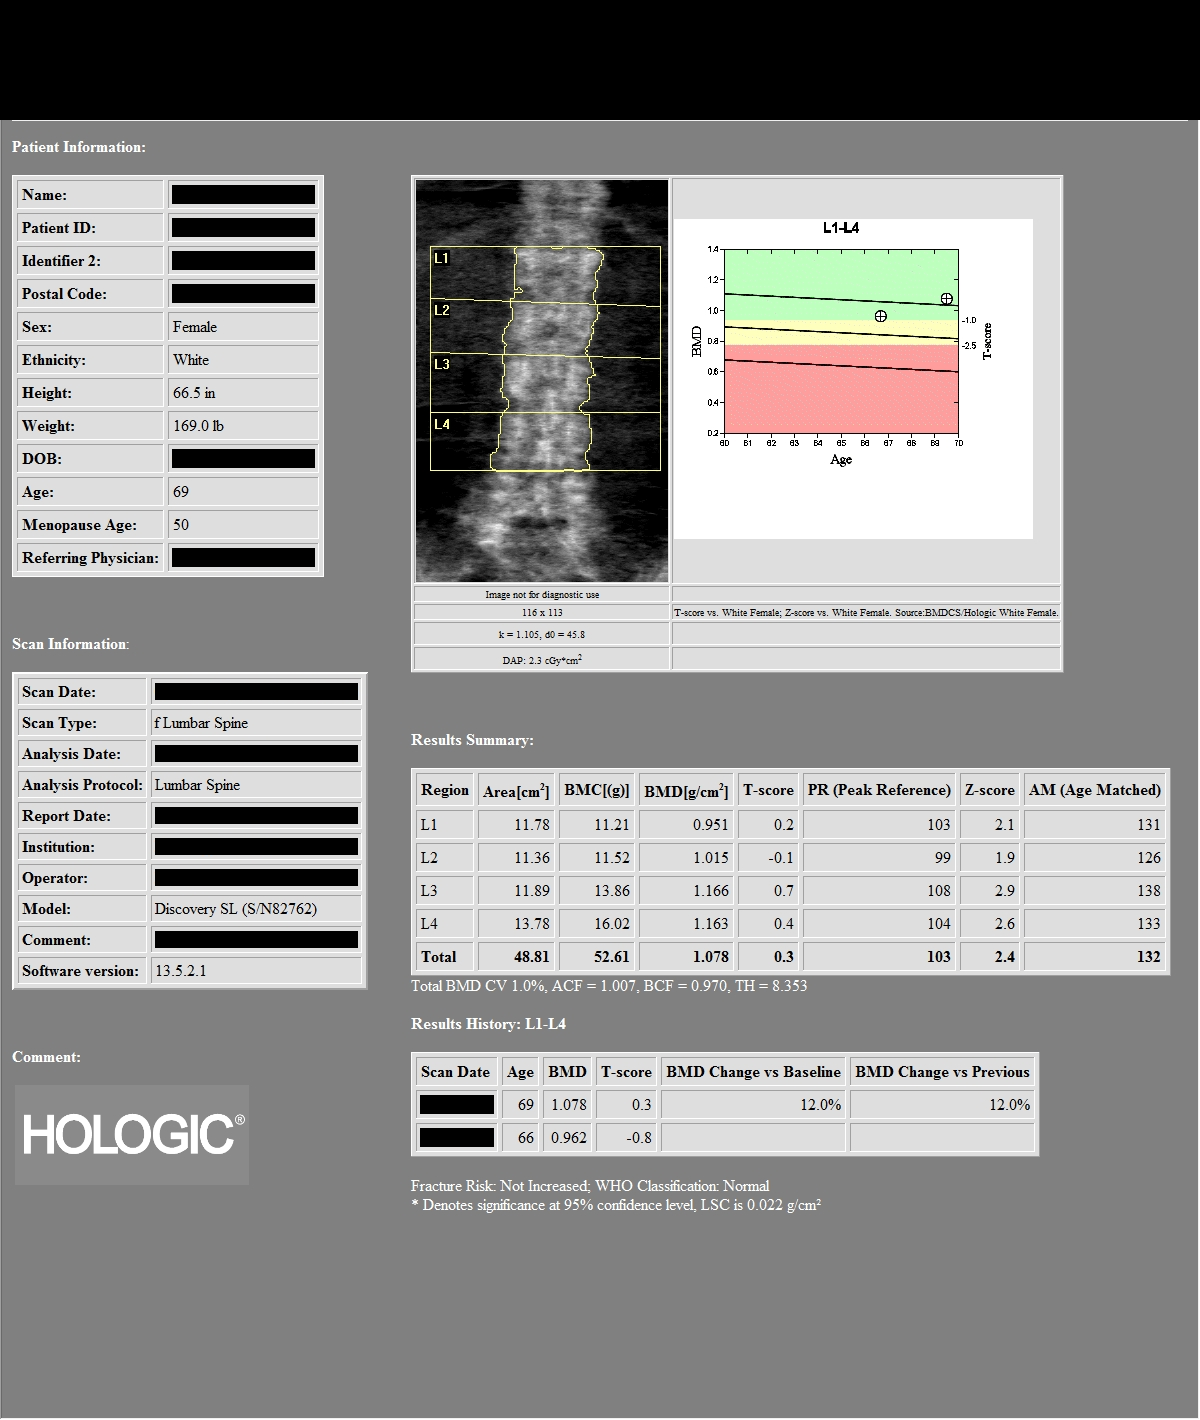

[Series 3: — · left · 1 of 1 slices shown (3 of 3)]
[im 1/1]
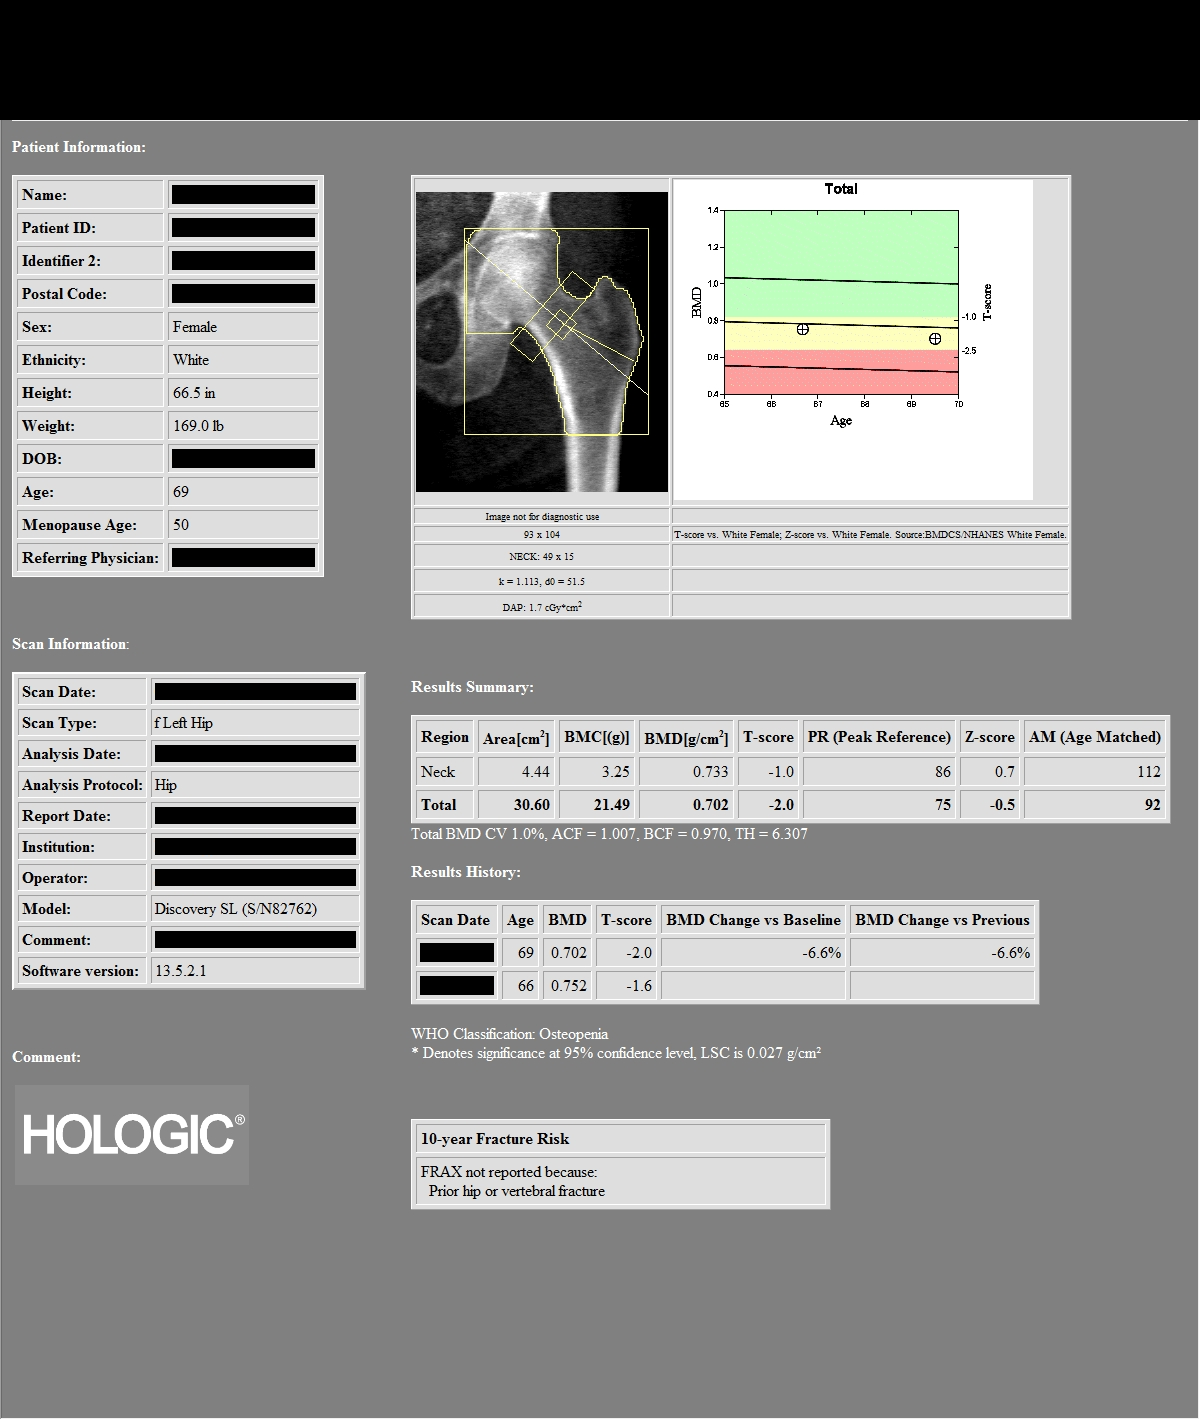

[3 of 3 positions shown; findings below may reference images not displayed]

IMPRESSION: As defined by World Health Organization, the patient meets the criteria for OSTEOPENIA based on hip T-score.

PATIENT DEMOGRAPHICS:  70-year-old white female.
RISK FACTORS:  History of compression fractures from lifting involving T12-L4 at the superior endplates. Patient is a smoker.

PRIOR EXAMS:  DXA from [HOSPITAL] dated 09/17/2018. MRI of the lumbar spine dated 03/13/2021.

METHOD:  Scans of the spine, hip, and forearm were performed using dual energy X-ray densitometry (DXA)
FINDINGS: 1.    Review of scanogram images shows increased sclerosis of the lumbar spine with some loss of height. No vertebra is excluded. This appears artificial due to numerous compression fractures seen on previous lumbar spine MRI.  

2.    The lumbar spine exam using L1-L4 regions shows average Bone Mineral Density is 1.078 gm/cm2 of Hydroxyapatite.  The T-score (comparing patient with a young adult group) is 0.3 standard deviations ABOVE mean. The Z-score (comparing patient with an age-matched group) is 2.4 standard deviations ABOVE mean.

COMPARED TO PRIOR DXA, spine bone density was 0.962 gm/cm2.  This is an interval increase of 0.116 gm/cm2 or 12.0 %. Technologist least significant change in the spine is 0.030 gm/cm2. This is a statistically significant interval increase.

3.  The left hip exam using total region of interest shows average Bone Mineral Density is 0.702 gm/cm2 of Hydroxyapatite. The T-score (comparing patient with a young adult group) is 2.0 standard deviations BELOW mean. The Z-score (comparing patient with an age-matched group) is 0.5 standard deviations BELOW mean.

COMPARED TO PRIOR DXA, femoral neck bone density was 0.752 gm/cm2.  This is an interval decrease of 0.050 gm/cm2 or -6.6 %. Technologist least significant change in the hip is 0.032 gm/cm2. This is a statistically significant interval decrease.

4.   The left forearm exam using [DATE] radius region of interest shows average Bone Mineral Density is 0.665 gm/cm2 of Hydroxyapatite. The T-score (comparing patient with a young adult group) is 0.3 standard deviations BELOW mean. The Z-score (comparing patient with an age-matched group) is 1.7 standard deviations ABOVE mean.

COMPARED TO PRIOR DXA, forearm bone density was not previously performed.

FRAX data is not provided due to history of vertebral body compression fractures.

RECOMMENDATIONS:  The patient states that she is taking supplements on a regular basis. CESSATION OF SMOKING AND REGULAR EXERCISE TO PATIENT TOLERANCE WOULD BE OF BENEFIT. THE PATIENT IS CURRENTLY NOT TAKING PRESCRIBED MEDICATION FOR PREVENTION OF BONE LOSS. According to criteria established by the National Osteoporosis Foundation, THE PATIENT DOES MEET THE CURRENT INDICATIONS FOR PRESCRIBED MEDICAL THERAPY BASED ON MULTIPLE VERTEBRAL FRAGILITY FRACTURES. The National Osteoporosis Foundation now recommends followup DXA scanning every two years in patients at risk regardless of whether the patient is undergoing pharmacological treatment.

World Health Organization criteria for diagnosis, please see link below.

[URL]

STAT Fax

## 2021-07-23 IMAGING — PT PET CT SKULL BASE TO THIGH_RESTAGING
1 of 3 series · 1 of 25 positions shown · non-contrast
Comparison: none

[Series 12: ct pet 4.0 br38 · axial · 4.0mm · 0.98mm/px · 1 of 278 slices shown]
[im 278/278  brain]
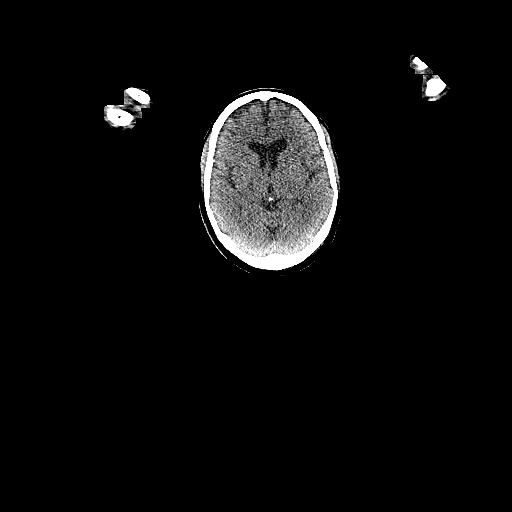

[1 of 25 positions shown; findings below may reference images not displayed]

REASON FOR EXAM

*
Malignant neoplasm of upper lobe, right bronchus or lung 

COMPARISON

*
CT chest, abdomen, pelvis 12/11/2020

*
Bone scan 03/24/2020

*
PET/CT 06/19/2018

TECHNIQUE

*
Height 66 inches

*
Weight 125.0 pounds

*
Serum glucose 130 mg/dL

*
11.51  mCi F-18 fluorodeoxyglucose was administered intravenously in a right upper extremity vein

*
87 minutes rest period after injection

*
Imaging field-of-view was skull base to mid-thigh

*
PET images were acquired and corrected for attenuation and time-of-flight

*
CT images were acquired without IV or oral contrast

*
PET, CT, and fused PET/CT images were reviewed at an independent reading station

*
Unless otherwise indicated, the standardized uptake value (SUV) is the maximum single pixel intensity within a region of interest, corrected for lean body mass

FINDINGS

BACKGROUND

*
Previous Liver 1.5  SUVmean

*
Current Liver 1.4 SUVmean

*
PERCIST Threshold 2.3 SUVpeak

METABOLIC FINDINGS

Lung Resection Site

*
RIGHT lung, upper lobe

*
No malignant activity

Lumpectomy Site

*
LEFT breast, upper outer quadrant

*
No malignant uptake

Lymph Nodes

*
No hypermetabolic lymph nodes

Metastases

*
No FDG avid distant metastases

Spine

*
None of the compression deformities demonstrate malignant activity

*
Most intense uptake is at the superior endplate of L1 (125)   1.8 SUV

Additional Activity

*
Mild degenerative activity most notable in the right shoulder (54)   2.9 SUV

ANATOMIC FINDINGS

*
Moderate/severe atherosclerotic disease

*
Emphysema

*
Cholelithiasis

*
Stable adenomatous thickening of the left adrenal gland

*
Renal cysts

*
Pelvic organ prolapse with a cystocele

*
Colonic diverticulosis

*
Demineralized bones

IMPRESSION

*
Negative exam for recurrent or metastatic tumor (lung cancer or breast cancer)

*
Vertebral compression fractures are not suggestive of malignancy (on PET)

*
Stat fax

## 2022-08-28 IMAGING — PT PET CT SKULL BASE TO THIGH_RESTAGING
1 of 3 series · 1 of 25 positions shown · non-contrast
Comparison: PET/CT 07/23/21

Images Obtained from [facility]
Height: 66 inches. Weight: 141 pounds.
INDICATION: Right lung and left breast cancer, restaging.
TECHNIQUE: The patient's serum glucose was 108 mg/dL at time of the study.  The patient was intravenously injected with 9.25 mCi mCi F-18 Fluorodeoxyglucose in a Left Antecubital Area vein. The
patient rested quietly for 62 Minutes and then received attenuation corrected PET/CT imaging with Time of Flight Protocol from the skull base to mid thigh. PET, CT, and fused images were reviewed at
the reading station. SUV is corrected based on lean body mass. Images are adequate for review.

[Series 3: pet ac · axial · 5.0mm · 4.07mm/px · 1 of 320 slices shown]
[im 160/320]
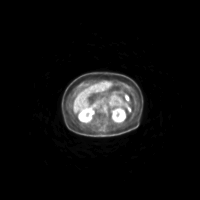

[1 of 25 positions shown; findings below may reference images not displayed]

FINDINGS: Mediastinum SUV max:
Liver SUV mean:
PERCIST threshold:
HEAD/NECK:
PET: No suspicious FDG activity in the head and neck.
CT:  No intracranial abnormality. No mass or lymphadenopathy. Severe carotid calcifications.
THORAX:
PET: No suspicious FDG activity in the thorax. No abnormal activity at the right lung resection site and left breast lumpectomy site.
Diffuse increased activity in the esophagus, likely inflammatory.
CT: No cardiomegaly or pericardial effusion. Mild coronary artery calcifications present. No lymphadenopathy. No suspicious pulmonary lesion. Mild centrilobular emphysema.
ABDOMEN/PELVIS:
PET: No suspicious FDG activity in the abdomen and pelvis.
CT: Cholelithiasis. Stable mild thickening of the left adrenal gland, likely on the basis of hyperplasia. Left renal cyst. Mild diverticulosis of the colon. Severe vascular calcifications.
SKELETAL:
PET: No suspicious FDG activity in the skeletal structures. Degenerative activity about the right shoulder and bilateral hips.
CT: Diffuse osseous demineralization. Multiple compression deformities in the lower thoracic and lumbar spine, similar to the previous exam.
IMPRESSION: 1. Today's PET/CT is compared to PET/CT 07/23/21. Patient remains in remission from lung cancer and breast cancer.
2. Similar appearance of multiple thoracic and lumbar vertebral compression fractures without increased metabolic activity to suggest malignancy.

## 2023-09-01 IMAGING — PT PET CT SKULL BASE TO THIGH_RESTAGING
1 of 3 series · 1 of 25 positions shown · non-contrast
Comparison: PET/CT 08/28/22, MRA neck 11/18/22

Images Obtained from [facility]
Height: 66 inches. Weight: 141 pounds.
INDICATION: Right lung cancer and left breast cancer, restaging.
TECHNIQUE: The patient's serum glucose was 130 mg/dL at time of the study.  The patient was intravenously injected with 11.68 mCi F-18 Fluorodeoxyglucose in a Left Hand vein. The patient rested
quietly for 60 Minutes and then received attenuation corrected PET/CT imaging with Time of Flight Protocol from the skull base to mid thigh. PET, CT, and fused images were reviewed at the reading
station. SUV is corrected based on lean body mass. Unless otherwise indicated, the standardized uptake value (SUV) is the maximum single pixel intensity within a region of interest. Images are
adequate for review.

[Series 4: ct pet 4.0 br38 · axial · 4.0mm · 0.98mm/px · 1 of 278 slices shown]
[im 278/278  brain]
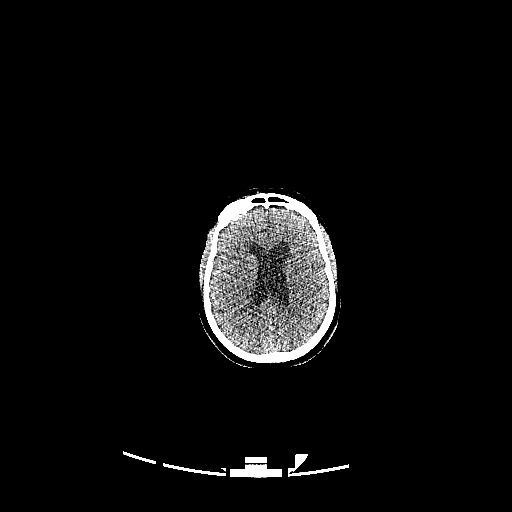

[1 of 25 positions shown; findings below may reference images not displayed]

FINDINGS: Mediastinum SUV:
Liver SUV mean:
PERCIST threshold:
PRIMARY TUMOR:
*  No malignant uptake at the right lung resection site
*  Stable low-grade FDG uptake in the left breast lumpectomy site with SUV 1.2 (111), previously
LYMPH NODES:
REGIONAL
*  No hypermetabolic regional lymph nodes
DISTANT
*  No hypermetabolic distant lymph nodes
METASTASES:
*  No malignant uptake
ADDITIONAL METABOLIC FINDINGS:
*  Low-grade FDG uptake in the left hepatic lobe with SUV 2.7 (137), previously
*  Activity is less than PERCIST threshold
*  Suggestion of a hypodense lesion at this location measuring 1.8 cm (137), previously 1.4 cm
*  Mild increased activity in the esophagus, likely inflammatory
OTHER FINDINGS:
*  Severe left and mild right carotid calcifications
*  Mild coronary artery calcifications
*  Small hiatal hernia
*  Mild centrilobular emphysema
*  Cholelithiasis
*  Stable thickening of the left adrenal gland, likely on the basis of hyperplasia
*  Left renal cyst
*  Diverticulosis of the distal colon
*  Severe vascular calcifications
*  Small fat-containing periumbilical hernia
*  Diffuse osseous demineralization
*  Multiple chronic lower thoracic and lumbar spine compression deformities
IMPRESSION: 1. Today's PET/CT is compared to PET/CT 08/28/22, MRA neck 11/18/22. Low-grade FDG uptake in the left liver with suggestion of a hypodense lesion at this location, mildly enlarged since the previous
exam. Suggest correlation with MRI abdomen without and with Eovist contrast.
2. Otherwise, no evidence of recurrent lung and breast cancer or metastatic disease.
ABNORMAL FINDINGS!
